# Patient Record
Sex: Male | Born: 1955
Health system: Southern US, Community
[De-identification: ages and names within clinical notes are randomized; demographics above are authoritative.]

## PROBLEM LIST (undated history)

## (undated) DIAGNOSIS — E785 Hyperlipidemia, unspecified: Secondary | ICD-10-CM

## (undated) DIAGNOSIS — I2510 Atherosclerotic heart disease of native coronary artery without angina pectoris: Secondary | ICD-10-CM

## (undated) DIAGNOSIS — M549 Dorsalgia, unspecified: Secondary | ICD-10-CM

## (undated) DIAGNOSIS — M25561 Pain in right knee: Secondary | ICD-10-CM

## (undated) DIAGNOSIS — F329 Major depressive disorder, single episode, unspecified: Secondary | ICD-10-CM

## (undated) DIAGNOSIS — D126 Benign neoplasm of colon, unspecified: Secondary | ICD-10-CM

## (undated) DIAGNOSIS — I219 Acute myocardial infarction, unspecified: Secondary | ICD-10-CM

## (undated) DIAGNOSIS — G8929 Other chronic pain: Secondary | ICD-10-CM

## (undated) DIAGNOSIS — J90 Pleural effusion, not elsewhere classified: Secondary | ICD-10-CM

## (undated) DIAGNOSIS — E669 Obesity, unspecified: Secondary | ICD-10-CM

## (undated) DIAGNOSIS — M25562 Pain in left knee: Secondary | ICD-10-CM

## (undated) DIAGNOSIS — E119 Type 2 diabetes mellitus without complications: Secondary | ICD-10-CM

## (undated) DIAGNOSIS — I10 Essential (primary) hypertension: Secondary | ICD-10-CM

## (undated) DIAGNOSIS — K219 Gastro-esophageal reflux disease without esophagitis: Secondary | ICD-10-CM

## (undated) DIAGNOSIS — F410 Panic disorder [episodic paroxysmal anxiety] without agoraphobia: Secondary | ICD-10-CM

## (undated) DIAGNOSIS — M1990 Unspecified osteoarthritis, unspecified site: Secondary | ICD-10-CM

## (undated) DIAGNOSIS — K589 Irritable bowel syndrome without diarrhea: Secondary | ICD-10-CM

## (undated) DIAGNOSIS — F32A Depression, unspecified: Secondary | ICD-10-CM

## (undated) HISTORY — DX: Irritable bowel syndrome, unspecified: K58.9

## (undated) HISTORY — DX: Atherosclerotic heart disease of native coronary artery without angina pectoris: I25.10

## (undated) HISTORY — DX: Morbid (severe) obesity due to excess calories: E66.01

## (undated) HISTORY — DX: Dorsalgia, unspecified: M54.9

## (undated) HISTORY — DX: Gastro-esophageal reflux disease without esophagitis: K21.9

## (undated) HISTORY — PX: SHOULDER ARTHROSCOPY W/ ROTATOR CUFF REPAIR: SHX2400

## (undated) HISTORY — DX: Hyperlipidemia, unspecified: E78.5

## (undated) HISTORY — PX: LUMBAR FUSION: SHX111

## (undated) HISTORY — DX: Other chronic pain: G89.29

## (undated) HISTORY — PX: UMBILICAL HERNIA REPAIR: SHX196

## (undated) HISTORY — DX: Type 2 diabetes mellitus without complications: E11.9

## (undated) HISTORY — DX: Acute myocardial infarction, unspecified: I21.9

## (undated) HISTORY — PX: BACK SURGERY: SHX140

## (undated) HISTORY — DX: Pleural effusion, not elsewhere classified: J90

## (undated) HISTORY — DX: Essential (primary) hypertension: I10

## (undated) HISTORY — PX: HERNIA REPAIR: SHX51

## (undated) HISTORY — DX: Benign neoplasm of colon, unspecified: D12.6

## (undated) HISTORY — DX: Obesity, unspecified: E66.9

## (undated) HISTORY — PX: CARPAL TUNNEL RELEASE: SHX101

---

## 2000-04-01 ENCOUNTER — Encounter (INDEPENDENT_AMBULATORY_CARE_PROVIDER_SITE_OTHER): Payer: Self-pay

## 2000-04-01 ENCOUNTER — Ambulatory Visit (HOSPITAL_COMMUNITY): Admission: RE | Admit: 2000-04-01 | Discharge: 2000-04-01 | Payer: Self-pay | Admitting: Internal Medicine

## 2000-04-01 DIAGNOSIS — D126 Benign neoplasm of colon, unspecified: Secondary | ICD-10-CM

## 2000-04-01 HISTORY — DX: Benign neoplasm of colon, unspecified: D12.6

## 2000-04-05 ENCOUNTER — Ambulatory Visit (HOSPITAL_COMMUNITY): Admission: RE | Admit: 2000-04-05 | Discharge: 2000-04-05 | Payer: Self-pay | Admitting: Internal Medicine

## 2000-04-05 ENCOUNTER — Encounter: Payer: Self-pay | Admitting: Internal Medicine

## 2000-04-30 ENCOUNTER — Ambulatory Visit (HOSPITAL_COMMUNITY): Admission: RE | Admit: 2000-04-30 | Discharge: 2000-04-30 | Payer: Self-pay | Admitting: Surgery

## 2002-04-16 ENCOUNTER — Emergency Department (HOSPITAL_COMMUNITY): Admission: EM | Admit: 2002-04-16 | Discharge: 2002-04-16 | Payer: Self-pay | Admitting: Emergency Medicine

## 2002-04-16 ENCOUNTER — Encounter: Payer: Self-pay | Admitting: Emergency Medicine

## 2004-06-18 ENCOUNTER — Ambulatory Visit: Payer: Self-pay | Admitting: Internal Medicine

## 2004-06-26 ENCOUNTER — Ambulatory Visit: Payer: Self-pay | Admitting: Internal Medicine

## 2004-07-31 ENCOUNTER — Ambulatory Visit: Payer: Self-pay | Admitting: Internal Medicine

## 2006-02-01 ENCOUNTER — Encounter: Admission: RE | Admit: 2006-02-01 | Discharge: 2006-02-01 | Payer: Self-pay | Admitting: Internal Medicine

## 2006-03-06 ENCOUNTER — Inpatient Hospital Stay (HOSPITAL_COMMUNITY): Admission: RE | Admit: 2006-03-06 | Discharge: 2006-03-09 | Payer: Self-pay | Admitting: Neurological Surgery

## 2006-05-14 ENCOUNTER — Encounter: Admission: RE | Admit: 2006-05-14 | Discharge: 2006-07-30 | Payer: Self-pay | Admitting: Neurological Surgery

## 2006-09-17 ENCOUNTER — Observation Stay (HOSPITAL_COMMUNITY): Admission: EM | Admit: 2006-09-17 | Discharge: 2006-09-18 | Payer: Self-pay | Admitting: *Deleted

## 2006-10-13 ENCOUNTER — Ambulatory Visit: Payer: Self-pay | Admitting: Cardiology

## 2007-02-14 ENCOUNTER — Emergency Department (HOSPITAL_COMMUNITY): Admission: EM | Admit: 2007-02-14 | Discharge: 2007-02-14 | Payer: Self-pay | Admitting: Emergency Medicine

## 2008-01-29 ENCOUNTER — Ambulatory Visit (HOSPITAL_COMMUNITY): Admission: RE | Admit: 2008-01-29 | Discharge: 2008-01-29 | Payer: Self-pay | Admitting: Neurological Surgery

## 2008-04-17 ENCOUNTER — Ambulatory Visit: Payer: Self-pay | Admitting: Occupational Medicine

## 2008-04-17 DIAGNOSIS — L259 Unspecified contact dermatitis, unspecified cause: Secondary | ICD-10-CM | POA: Insufficient documentation

## 2008-04-17 LAB — CONVERTED CEMR LAB
Bilirubin Urine: NEGATIVE
Blood in Urine, dipstick: NEGATIVE
Glucose, Urine, Semiquant: NEGATIVE
Nitrite: NEGATIVE
Protein, U semiquant: NEGATIVE
Specific Gravity, Urine: 1.025
pH: 5.5

## 2010-01-14 ENCOUNTER — Emergency Department (HOSPITAL_COMMUNITY): Admission: EM | Admit: 2010-01-14 | Discharge: 2010-01-14 | Payer: Self-pay | Admitting: Emergency Medicine

## 2010-01-16 ENCOUNTER — Ambulatory Visit (HOSPITAL_COMMUNITY)
Admission: RE | Admit: 2010-01-16 | Discharge: 2010-01-16 | Payer: Self-pay | Source: Home / Self Care | Admitting: Neurological Surgery

## 2010-01-17 ENCOUNTER — Encounter: Admission: RE | Admit: 2010-01-17 | Discharge: 2010-01-17 | Payer: Self-pay | Admitting: Neurosurgery

## 2010-01-28 ENCOUNTER — Emergency Department (HOSPITAL_BASED_OUTPATIENT_CLINIC_OR_DEPARTMENT_OTHER)
Admission: EM | Admit: 2010-01-28 | Discharge: 2010-01-28 | Payer: Self-pay | Source: Home / Self Care | Admitting: Emergency Medicine

## 2010-02-18 DIAGNOSIS — I219 Acute myocardial infarction, unspecified: Secondary | ICD-10-CM

## 2010-02-18 HISTORY — DX: Acute myocardial infarction, unspecified: I21.9

## 2010-02-20 ENCOUNTER — Inpatient Hospital Stay (HOSPITAL_COMMUNITY)
Admission: RE | Admit: 2010-02-20 | Discharge: 2010-02-23 | Payer: Self-pay | Source: Home / Self Care | Attending: Neurological Surgery | Admitting: Neurological Surgery

## 2010-02-20 LAB — GLUCOSE, POCT (MANUAL RESULT ENTRY)
Glucose, Bld: 118 mg/dL — ABNORMAL HIGH (ref 70–99)
Operator id: 133981

## 2010-02-21 LAB — GLUCOSE, CAPILLARY
Glucose-Capillary: 152 mg/dL — ABNORMAL HIGH (ref 70–99)
Glucose-Capillary: 174 mg/dL — ABNORMAL HIGH (ref 70–99)
Glucose-Capillary: 145 mg/dL — ABNORMAL HIGH (ref 70–99)
Glucose-Capillary: 210 mg/dL — ABNORMAL HIGH (ref 70–99)
Glucose-Capillary: 123 mg/dL — ABNORMAL HIGH (ref 70–99)
Glucose-Capillary: 83 mg/dL (ref 70–99)

## 2010-02-22 LAB — GLUCOSE, CAPILLARY
Glucose-Capillary: 90 mg/dL (ref 70–99)
Glucose-Capillary: 102 mg/dL — ABNORMAL HIGH (ref 70–99)
Glucose-Capillary: 115 mg/dL — ABNORMAL HIGH (ref 70–99)

## 2010-02-23 LAB — GLUCOSE, CAPILLARY
Glucose-Capillary: 64 mg/dL — ABNORMAL LOW (ref 70–99)
Glucose-Capillary: 95 mg/dL (ref 70–99)

## 2010-03-11 ENCOUNTER — Encounter: Payer: Self-pay | Admitting: Neurological Surgery

## 2010-04-12 NOTE — Discharge Summary (Signed)
NAMEAXEL, [name]                ACCOUNT NO.:  [id number]  MEDICAL RECORD NO.:  [id number]          PATIENT TYPE:  INP  LOCATION:  3015                         FACILITY:  [facility]  PHYSICIAN:  Stefani Dama, M.D.  DATE OF BIRTH:  [birth year]  DATE OF ADMISSION:  02/20/2010 DATE OF DISCHARGE:  02/23/2010                              DISCHARGE SUMMARY   ADMITTING DIAGNOSIS:  Lumbar spondylosis and stenosis, L4-5, L5-S1 with previous arthrodesis at L2-L4.  DISCHARGE DIAGNOSES: 1. Lumbar spondylosis and stenosis, L4-5, L5-S1 with previous     arthrodesis at L2-L4. 2. Acute blood loss anemia.  OPERATIONS AND PROCEDURES:  Decompression L4-5, L5-S1 posterior lumbar interbody arthrodesis, revision of segmental fixation L2-S1 and posterolateral arthrodesis L4-5, L5-S1.  BRIEF HISTORY AND HOSPITAL COURSE:  Spencer [name] is a 55 year old gentleman who had significant problems, chronic back pain, recently had a right lumbar radiculopathy was quite severe, developed significant stenosis at the L4-5 level and L5-S1 and below his previous L2-L4 posterior spinal fusion which was done in 2008, with this adjacent segment disease, he failed conservative care including epidural steroid injections at that time, home exercise program, physical therapy, anti- inflammatories, and medications, and he elected to proceed with revision arthrodesis and decompression.  He tolerated this procedure well on February 20, 2010.  Postoperatively, first day, he was doing well.  Vital signs were stable.  Wound was with moderate drainage.  Dressing was changed.  Neurovascularly intact.  No focal deficits.  Foley catheter discontinued.  He was weaned off his PCA, placed on p.o. Percocet, IV was hep-locked, transferred to 3000 of the neurosurgical ICU.  Discharge planning was arranged.  Started physical therapy, occupational therapy, started with mobilization.  He remained neurologically intact during  his hospitalization.  Discharge planning was started for home health physical therapy and home health occupational therapy.  He continued to mobilize and is ready for discharge home on February 23, 2010, eating well, voiding well, ambulating safely following his back precautions. His dressing drainage has subsided.  Vital signs were stable.  He is afebrile.  No new focal deficits.  DISCHARGE CONDITION:  Stable and improved.  DISCHARGE INSTRUCTIONS:  Discharged on February 23, 2010, follow up in 3-4 weeks with Dr. Danielle Dess, given prescription for Percocet, which is on the chart.  Continue with his back precautions, home health, OT and PT, and a 3-in-1 rolling walker with wheels for home use.  DISCHARGE MEDICATIONS: 1. Vitamin C 500 mg 2 tablets daily. 2. 81 mg enteric coated aspirin daily. 3. MiraLax p.r.n. 4. Dulcolax p.r.n. 5. Vitamin B6 100 mg daily/ 6. Tramadol one p.o. q.6 hours p.r.n. pain. 7. Lyrica 150 mg b.i.d. 8. Percocet 5/325 one p.o. q.4-6 hours p.r.n. pain. 9. Sertraline 50 mg p.o. daily. 10.Ranitidine 150 mg p.o. daily.11.Diltiazem CD XT 240 one p.o. daily. 12.Ambien 10 mg p.o. bedtime. 13.Alprazolam 0.5 mg one p.o. q.6 hours p.r.n. pain. 14.Lovaza 1 g p.o. 2 tablets twice daily. 15.Robaxin 500 mg t.i.d. p.r.n. 16.Hydrochlorothiazide 25 mg p.o. daily. 17.Quinapril 20 mg p.o. daily. 18.Glimepiride and metformin 5/500 two tablets twice a day.  DISCHARGE FOLLOWUP:  In our office in  3-4 weeks.  Continue with ambulation, deep breathing 10 breaths every hour while awake.  Contact our office prior to followup if he has any questions or concerns.  DISCHARGE CONDITION:  Stable, improved.     [name] Medico.   ______________________________ Stefani Dama, M.D.    SCI/MEDQ  D:  04/05/2010  T:  04/06/2010  Job:  [id number]  Electronically Signed by Orlin Hilding P.A. on 04/11/2010 02:50:39 PM Electronically Signed by Barnett Abu M.D. on 04/12/2010 08:02:18 AM

## 2010-04-16 ENCOUNTER — Ambulatory Visit: Payer: 59 | Attending: Neurological Surgery | Admitting: Physical Therapy

## 2010-04-16 DIAGNOSIS — IMO0001 Reserved for inherently not codable concepts without codable children: Secondary | ICD-10-CM | POA: Insufficient documentation

## 2010-04-16 DIAGNOSIS — M6281 Muscle weakness (generalized): Secondary | ICD-10-CM | POA: Insufficient documentation

## 2010-04-16 DIAGNOSIS — M545 Low back pain, unspecified: Secondary | ICD-10-CM | POA: Insufficient documentation

## 2010-04-16 DIAGNOSIS — R269 Unspecified abnormalities of gait and mobility: Secondary | ICD-10-CM | POA: Insufficient documentation

## 2010-04-17 ENCOUNTER — Ambulatory Visit: Payer: 59 | Admitting: Physical Therapy

## 2010-04-20 ENCOUNTER — Ambulatory Visit: Payer: 59 | Attending: Neurological Surgery | Admitting: Physical Therapy

## 2010-04-20 DIAGNOSIS — M545 Low back pain, unspecified: Secondary | ICD-10-CM | POA: Insufficient documentation

## 2010-04-20 DIAGNOSIS — IMO0001 Reserved for inherently not codable concepts without codable children: Secondary | ICD-10-CM | POA: Insufficient documentation

## 2010-04-20 DIAGNOSIS — M6281 Muscle weakness (generalized): Secondary | ICD-10-CM | POA: Insufficient documentation

## 2010-04-20 DIAGNOSIS — R269 Unspecified abnormalities of gait and mobility: Secondary | ICD-10-CM | POA: Insufficient documentation

## 2010-04-23 ENCOUNTER — Ambulatory Visit: Payer: 59 | Admitting: Physical Therapy

## 2010-04-24 ENCOUNTER — Ambulatory Visit: Payer: 59 | Admitting: Physical Therapy

## 2010-04-25 ENCOUNTER — Ambulatory Visit: Payer: 59 | Admitting: Physical Therapy

## 2010-04-30 ENCOUNTER — Ambulatory Visit: Payer: 59 | Admitting: Physical Therapy

## 2010-04-30 LAB — BASIC METABOLIC PANEL
Creatinine, Ser: 0.79 mg/dL (ref 0.4–1.5)
Potassium: 4 mEq/L (ref 3.5–5.1)
Sodium: 137 mEq/L (ref 135–145)

## 2010-04-30 LAB — CBC
MCH: 29.6 pg (ref 26.0–34.0)
MCHC: 33.7 g/dL (ref 30.0–36.0)
RDW: 13.5 % (ref 11.5–15.5)
WBC: 7.4 10*3/uL (ref 4.0–10.5)

## 2010-04-30 LAB — GLUCOSE, CAPILLARY
Glucose-Capillary: 114 mg/dL — ABNORMAL HIGH (ref 70–99)
Glucose-Capillary: 121 mg/dL — ABNORMAL HIGH (ref 70–99)
Glucose-Capillary: 124 mg/dL — ABNORMAL HIGH (ref 70–99)
Glucose-Capillary: 166 mg/dL — ABNORMAL HIGH (ref 70–99)

## 2010-04-30 LAB — MRSA PCR SCREENING: MRSA by PCR: NEGATIVE

## 2010-04-30 LAB — POCT I-STAT 4, (NA,K, GLUC, HGB,HCT)
Glucose, Bld: 116 mg/dL — ABNORMAL HIGH (ref 70–99)
HCT: 32 % — ABNORMAL LOW (ref 39.0–52.0)
Hemoglobin: 10.9 g/dL — ABNORMAL LOW (ref 13.0–17.0)
Potassium: 4.3 mEq/L (ref 3.5–5.1)
Sodium: 138 mEq/L (ref 135–145)

## 2010-04-30 LAB — TYPE AND SCREEN: ABO/RH(D): B POS

## 2010-04-30 LAB — SURGICAL PCR SCREEN: MRSA, PCR: NEGATIVE

## 2010-04-30 LAB — POCT I-STAT GLUCOSE
Glucose, Bld: 118 mg/dL — ABNORMAL HIGH (ref 70–99)
Operator id: 133981

## 2010-05-01 LAB — URINALYSIS, ROUTINE W REFLEX MICROSCOPIC: Specific Gravity, Urine: 1.021 (ref 1.005–1.030)

## 2010-05-02 ENCOUNTER — Ambulatory Visit: Payer: 59 | Admitting: Physical Therapy

## 2010-05-04 ENCOUNTER — Ambulatory Visit: Payer: 59 | Admitting: Physical Therapy

## 2010-05-07 ENCOUNTER — Ambulatory Visit: Payer: 59 | Admitting: Physical Therapy

## 2010-05-09 ENCOUNTER — Ambulatory Visit: Payer: 59 | Admitting: Physical Therapy

## 2010-05-11 ENCOUNTER — Encounter: Payer: Medicare Other | Admitting: Physical Therapy

## 2010-05-15 ENCOUNTER — Ambulatory Visit: Payer: 59 | Admitting: Physical Therapy

## 2010-05-17 ENCOUNTER — Ambulatory Visit: Payer: 59 | Admitting: Physical Therapy

## 2010-05-18 ENCOUNTER — Ambulatory Visit: Payer: 59 | Admitting: Physical Therapy

## 2010-05-21 ENCOUNTER — Ambulatory Visit: Payer: 59 | Attending: Neurological Surgery | Admitting: Physical Therapy

## 2010-05-21 DIAGNOSIS — M545 Low back pain, unspecified: Secondary | ICD-10-CM | POA: Insufficient documentation

## 2010-05-21 DIAGNOSIS — R269 Unspecified abnormalities of gait and mobility: Secondary | ICD-10-CM | POA: Insufficient documentation

## 2010-05-21 DIAGNOSIS — M6281 Muscle weakness (generalized): Secondary | ICD-10-CM | POA: Insufficient documentation

## 2010-05-21 DIAGNOSIS — IMO0001 Reserved for inherently not codable concepts without codable children: Secondary | ICD-10-CM | POA: Insufficient documentation

## 2010-05-23 ENCOUNTER — Ambulatory Visit: Payer: 59 | Admitting: Physical Therapy

## 2010-05-24 ENCOUNTER — Encounter: Payer: Commercial Managed Care - PPO | Admitting: Physical Therapy

## 2010-07-03 NOTE — Discharge Summary (Signed)
Spencer [name], Spencer [name]                ACCOUNT NO.:  [id number]   MEDICAL RECORD NO.:  [id number]          PATIENT TYPE:  INP   LOCATION:  2024                         FACILITY:  [facility]   PHYSICIAN:  Larina Earthly, M.D.        DATE OF BIRTH:  02/15/1956   DATE OF ADMISSION:  09/17/2006  DATE OF DISCHARGE:  09/18/2006                               DISCHARGE SUMMARY   DISCHARGE DIAGNOSES:  1. Atypical chest pain rule out myocardial infarction with three sets      cardiac enzymes unremarkable.  2. Anxiety disorder in the setting of multiple health related      stressors.  3. Hypertension.  4. Type 2 diabetes mellitus complicated by hypoglycemia.  5. Pain management status post spinal lumbar decompression.   SECONDARY DIAGNOSES:  1. Irritable bowel syndrome and constipation.  2. Seasonal allergic rhinitis.  3. Hyperlipidemia   DISCHARGE MEDICATIONS:  1. Hydrochlorothiazide 25 mg p.o. daily.  2. Glucovance 5/500 one p.o. b.i.d.  3. Cardizem extended release 240 mg each day.  4. Quinapril 20 mg each day.  5. Aspirin 81 mg each day.  6. Lopid as directed before.  7. Ranitidine 300 mg each day.  8. Aspirin 81 mg each day.  9. Allegra 180 mg each day.  10.MiraLax 17 grams each day.  Xanax 0.5 mg p.o. q.6 h. p.r.n.      prescription for #30 no refills given as a new prescription.   FOLLOWUP:  The patient call Dr. Lanell Matar office for routine follow up  on his atypical chest pain anxiety disorder; and the need for an  outpatient stress test at [facility] cardiology per the patient's request.  The patient will also need further review of his oral hypoglycemic  agents and possibly with the discontinuation and reduction in the dose  of his oral sulfanuria.   PERTINENT LABS:  Cardiac enzymes x3 negative.  Please see history and  physical for all admission labs.   HISTORY OF PRESENT ILLNESS:  This is a 55 year old Caucasian male who  has multiple cardiovascular risk factors including a family  history for  early heart disease who presented with vague left-sided chest  complaints, in the setting of possible hypoglycemia and significant  sweats and anxiety disorder.  Given his multiple risk factors; and his  presenting symptomatology as discussed in my dictated history of the  present illness.  The patient was admitted for further evaluation and  management to include ruling out for myocardial infarction.   HOSPITAL COURSE:  The patient remained hemodynamically stable and  started Darvocet for pain relief of his lower back discomfort as well as  Xanax used on one occasion prior to bedtime with complete resolution of  the patient's left-sided discomfort.  Given the fact that his cardiac  enzymes were unremarkable the next day; and the fact that he is  hemodynamically stable and ambulating in the hall without difficulties.  He was deemed appropriate for discharge and further management on an  outpatient basis specifically:  1. He may need an outpatient stress test given his multiple  cardiovascular risk factors.  2. We will need to restart all of his antihypertensive medications and      look for stability of his blood pressure on an outpatient basis.  3. For his type 2 diabetes mellitus, he has recurrent hypoglycemia and      he was advised to check his CBGs more frequently and his oral      sulfanuria composition of his Glucovance dosing may need to be      reduced in the future at Dr. Lanell Matar discretion.  4. Pain management.  The patient was encouraged to use Darvocet on as-      needed basis as long as he was not developing any dependence and or      adverse side effects consisting of constipation.  5. Anxiety disorder, apparently longstanding.  A short prescription      for Xanax was given to the patient with the possible use of long-      term medications to be used in the future at Dr. Lanell Matar      discretion, the latter was discussed with the patient and wife at       length.   The patient was discharged in good condition on September 18, 2006.  on any  towel      Larina Earthly, M.D.  Electronically Signed     RA/MEDQ  D:  09/18/2006  T:  09/18/2006  Job:  [id number]   cc:   Geoffry Paradise, M.D.  [facility] Cardiology

## 2010-07-03 NOTE — H&P (Signed)
Spencer [name], Spencer [name]                ACCOUNT NO.:  [id number]   MEDICAL RECORD NO.:  [id number]          PATIENT TYPE:  INP   LOCATION:  2024                         FACILITY:  [facility]   PHYSICIAN:  Larina Earthly, M.D.        DATE OF BIRTH:  [birth year]   DATE OF ADMISSION:  09/17/2006  DATE OF DISCHARGE:                              HISTORY & PHYSICAL   CHIEF COMPLAINT:  Hyper feeling inside the chest with sweats.   HISTORY OF THE PRESENT ILLNESS:  This is a 55 year old Caucasian male  with a 9-year history of type 2 diabetes mellitus, hyperlipidemia,  hypertension who has lost approximately 40 to 60 pounds in the last half  year status post decompression of a lumbar stenosis by Dr. Danielle Dess.  His  diabetes has been recently complicated by hypoglycemia with a subsequent  dose reduction of his metformin and sulfonylurea.  The patient's family  history is notable for significant early heart disease in the setting of  tobacco abuse and hypertension as well as type 2 diabetes mellitus.   Of note, the patient has been trying to wean himself off of his pain  medications status post his lumbar decompression and also trying to  continue to lose weight by decreasing his caloric intake and keeping  active by walking approximately 1 mile a day, if not more, as well as  participating in water aerobics at the [facility]   On the evening of September 16, 2006, the patient only took a Tylenol and  Benadryl prior to bedtime, and he subsequently awoke at approximately 2  to 3 p.m. feeling hyper in the left chest but denies any frank pain or  squeezing-type sensation.  He denies any sweats, nausea or vomiting, or  shortness of breath at the time.  He remained restless for the rest of  the evening, and he awoke at approximately 7 to 8 a.m. and took an  approximately 1-mile walk, which is his norm.  He continued to have this  discomfort in the left chest; however, this did not increase.  This has  been complicated by  stressors consisting of household issues and his  overall health.  He subsequently ate a bowel of cereal and showered  followed by significant sweats in the setting of continued left chest  discomfort but no frank pain.  He later presented to the emergency room  later in the day after discussion with his wife who is a case Production designer, theatre/television/film at  [employer] as well as other health care professionals.  In the  emergency room, his course was complicated by hypoglycemia to a sugar of  49, having taken his oral sulfonylurea and only 1 bowel of cereal during  the day.  After his sugar subsequently increased with caloric intake, he  felt significantly better but still had vague symptoms in the left side  of his chest.  Initial evaluation in the emergency room revealed normal  cardiac enzymes, EKG, chest x-ray, and CBC and renal and liver  parameters.  Given his continued vague symptoms in the left chest as  well  as his family history of significant early heart disease and his  cardiovascular risk factors consisting of diabetes, hypertension, and  hyperlipidemia, he was deemed appropriate for 23-hour observation to  rule out for myocardial infarction and further evaluation and management  of any potential cardiac ischemia.  Please note that his sweats may have  been complicated by hypoglycemia in the setting of decreased caloric  intake.   REVIEW OF SYSTEMS:  As above but negative for any neurological or  musculoskeletal deficits, and specifically negative for any history of  microvascular complications of his diabetes, including retinopathy,  neuropathy, or nephropathy.   PROBLEM LIST:  1. Type 2 diabetes x9 years.  2. Hyperlipidemia with initiation of fenofibrate.  3. Hypertension.  4. Irritable bowel syndrome.  5. Constipation.  6. Seasonal allergic rhinitis.  7. GERD.  8. A history of spinal decompression in January of 2008 by Dr. Danielle Dess.   MEDICATIONS:  Include:  1.  Hydrochlorothiazide 25 mg.  2. Accupril 20 mg.  3. Diltiazem XT 240 mg.  4. Ranitidine 300 mg each day.  5. Triamcinolone 0.1% p.r.n.  6. Aspirin 81 mg every other day.  7. Fexofenadine 180 mg each day.  8. Metrogel 1% each day.  9. Glucovance 5/500 one p.o. b.i.d., decreased within the last couple      of months secondary to hypoglycemia.  10.Vitamin B6.  11.Vitamin C.  12.MiraLax on a daily basis.  13.Possibly fenofibrate.   FAMILY HISTORY:  Father died at the age of 66 of lung cancer, coronary  artery disease, and tobacco abuse.  Mother is 30 and has a history of  type 2 diabetes mellitus, hypertension, and peripheral vascular disease.  A 11 year old brother who is alive and well, a 51 year old brother who  has type 2 diabetes and early heart disease, and a 57 year old sister  who is alive and well.   SOCIAL HISTORY:  The patient is married.  His wife is a Futures trader at  [employer].  He is a Hospital doctor for [employer] and denies  any tobacco or alcohol abuse.   LABORATORY EVALUATION:  Sodium 140, potassium 3.9, chloride 105, carbon  dioxide 26, glucose 120, BUN 18, creatinine 0.77.  Liver function test  normal.  Myoglobin 28.5, CK-MB less than 1, troponin-I less than 0.05.  White blood cell count 8.5, hemoglobin 13.2, hematocrit 39%, platelet  count 333.  EKG reveals normal sinus rhythm, and chest x-ray is  unremarkable with no cardiomegaly or acute findings.   PHYSICAL EXAM:  GENERAL:  An obese gentleman who is in no apparent  distress but somewhat anxious, alert and oriented x4.  VITAL SIGNS:  Blood pressure 119/73, pulse 74 to 80 and regular,  respirations 20 and nonlabored, oxygen saturation 99% on room air,  temperature 97 degrees Fahrenheit.  HEENT:  Sclerae anicteric.  Extraocular movements are intact.  Face is  symmetric.  NECK:  Supple.  There is no carotid bruits.  There is no thyromegaly.  No cervical lymphadenopathy.  LUNGS:  Clear to  auscultation bilaterally.  CARDIOVASCULAR EXAM:  Reveals a regular rate and rhythm without murmurs,  rubs, or gallops.  CHEST WALL:  There is no chest wall tenderness.  ABDOMINAL EXAM:  Reveals a soft, nontender, nondistended abdomen.  Bowel  sounds are present.  EXTREMITY EXAM:  Reveals no edema.  Pedal pulses are intact.  NEUROLOGICAL EXAM:  Grossly nonfocal.   ASSESSMENT AND PLAN:  1. Atypical chest pain with a family history of early heart  disease      and cardiovascular risk factors, including hypertension and      hyperlipidemia but with no known micro or macrovascular issues.  We      will rule out for myocardial infarction, provide aspirin, and his      normal angiotensin-converting enzyme inhibitor and calcium channel      blockade, and if patient's cardiac enzymes and electrocardiograms      remain unremarkable, we will plan for outpatient stress testing      and/or further cardiac management.  2. Hypertension, stable.  We will continue current medications.  3. Type 2 diabetes mellitus.  We will hold metformin and sulfonylurea      for the time being and use sliding scale insulin and reinitiate      possibly only metformin on an outpatient basis given his      hypoglycemia.  4. Pain management.  We will add Tylenol and Darvocet as needed.  5. Anxiety disorder.  We will provide Xanax p.r.n. and have Dr.      Jacky Kindle manage this disorder on an outpatient basis as needed.      Larina Earthly, M.D.  Electronically Signed     RA/MEDQ  D:  09/17/2006  T:  09/18/2006  Job:  [id number]   cc:   Geoffry Paradise, M.D.  [facility] Cardiology

## 2010-07-06 NOTE — Op Note (Signed)
[facility]  Patient:    Spencer [name], Spencer [name]                       MRN: [id number] Proc. Date: 04/30/00 Adm. Date:  [id number] Attending:  Bonnetta Barry CC:         Wilhemina Bonito. Eda Keys., M.D. [facility]   Operative Report  PREOPERATIVE DIAGNOSIS:  Incarcerated umbilical hernia.  POSTOPERATIVE DIAGNOSIS:  Incarcerated umbilical hernia.  OPERATION:  Repair of incarcerated umbilical hernia with Prolene mesh.  SURGEON:  Velora Heckler, M.D.  ANESTHESIA:  General.  ESTIMATED BLOOD LOSS:  Minimal.  PREPARATION:  Betadine.  COMPLICATIONS:  None.  INDICATIONS:  The patient is a 56 year old white male who was presents with incarcerated umbilical hernia.  He had been evaluated by Dr. Yancey [name] for abdominal pain.  He had previously been seen in our practice by Dr. Ovidio [name] in 1999.  At that time, he deferred surgery for incarcerated umbilical hernia.  He now comes to surgery for repair.  DESCRIPTION OF PROCEDURE:  The procedure was done in OR #2 at the [facility].  The patient was brought to the operating room and placed in the supine position on the operating room table.  Following administration of general anesthesia, the patient is prepped and draped in the usual strict aseptic fashion.  After ascertaining that adequate level of anesthesia had been obtained, an infraumbilical incision is made with a #15 blade. Dissection is carried down through the subcutaneous tissues.  Hemostasis was obtained with the electrocautery. Dissection is carried down to the hernia defect.  Fascial plane is developed circumferentially.  Hernia sac is opened, and it contains incarcerated omentum.  This is dissected away from the posterior aspect of the umbilicus with the electrocautery.  The omentum is reduced back within the peritoneal cavity.  The fascial plane is completely developed.  The hernia sac is cauterized with the electrocautery.  The  fascial defect is closed transversely with interrupted 0 Ethibond sutures.  Next, a sheet of Prolene mesh is cut to the appropriate dimensions and placed over the closure on the abdominal wall.  It is secured circumferentially with interrupted 0 Ethibond sutures.  The umbilicus is then affixed back to the abdominal wall with an interrupted 3-0 Vicryl suture.  Subcutaneous tissues are reapproximated with interrupted 3-0 Vicryl sutures.  Skin edges are reapproximated with interrupted 4-0 Vicryl subcuticular sutures.  Local anesthetic is injected.  Wound is washed and dried.  Benzoin and Steri-Strips are applied.  Sterile gauze dressings are applied.  The patient is awakened from anesthesia and brought to the recovery room in stable condition.  The patient tolerated the procedure well. DD:  04/30/00 TD:  04/30/00 Job: [id number]

## 2010-07-06 NOTE — Discharge Summary (Signed)
[name], Spencer [name]                ACCOUNT NO.:  [id number]   MEDICAL RECORD NO.:  [id number]          PATIENT TYPE:  INP   LOCATION:  3004                         FACILITY:  [facility]   PHYSICIAN:  Hilda Lias, M.D.   DATE OF BIRTH:  03-16-1955   DATE OF ADMISSION:  03/06/2006  DATE OF DISCHARGE:  03/09/2006                               DISCHARGE SUMMARY   ADMISSION DIAGNOSES:  Degenerative disk disease at the level of L3-L4.  Fusion from L2 to L4.   FINAL DIAGNOSES:  Degenerative disk disease at the level of L3-L4.  Fusion from L2 to L4.   CLINICAL HISTORY:  The patient was admitted because of back pain  radiating to both legs.  X-rays showed degenerative disk disease at the  level for L2-3, L3-4.  Surgery was advised.  Laboratory normal.   COURSE IN THE HOSPITAL:  The patient was taken to surgery and fusion of  L2-3 and L3-4 was performed using pedicle screws.  The patient today is  doing much better.  He is ambulating.  The wound is slightly reddish  without drainage, but no pain whatsoever.  He has been ambulating.  He  states that he wants to go home.  He is going to be discharged and he  will be seen by Dr. Danielle Dess in the office in a week.   CONDITION ON DISCHARGE:  Improved.   MEDICATIONS:  1. Dilaudid for pain.  2. Diazepam.  3. Cipro.   ACTIVITY:  Not to drive.  Not to do any lifting __________           ______________________________  Hilda Lias, M.D.     EB/MEDQ  D:  03/09/2006  T:  03/09/2006  Job:  [id number]

## 2010-07-06 NOTE — Op Note (Signed)
NAMESPENSER, HARREN                ACCOUNT NO.:  [id number]   MEDICAL RECORD NO.:  [id number]          PATIENT TYPE:  INP   LOCATION:  3172                         FACILITY:  [facility]   PHYSICIAN:  Stefani Dama, M.D.  DATE OF BIRTH:  02-28-1955   DATE OF PROCEDURE:  03/06/2006  DATE OF DISCHARGE:                               OPERATIVE REPORT   PREOPERATIVE DIAGNOSIS:  Spondylosis and stenosis L2-L3 and L3-L4,  spondylolisthesis L3-L4 with right sided radiculopathy L2, L3, and L4.   POSTOPERATIVE DIAGNOSIS:  Spondylosis and stenosis L2-L3 and L3-L4,  spondylolisthesis L3-L4 with right sided radiculopathy L2, L3, and L4.   OPERATION:  Lumbar laminectomy L2 and L3, decompression of L2, L3 and L4  nerve roots bilaterally, posterior lumbar interbody fusion L3-L4 with  PEEK spacers, transforaminal lumbar fusion L2-L3 on the left with PEEK  spacer, segmental fixation of L2 to L4 with pedicle screws,  posterolateral arthrodesis with local autograft and allograft, and  posterior interbody arthrodesis with local autograft and allograft L2 to  L4.   SURGEON:  Stefani Dama, M.D.   FIRST ASSISTANT:  Cristi Loron, M.D.   ANESTHESIA:  General endotracheal.   INDICATIONS:  Mamie Diiorio is a 55 year old individual who has had  significant back and right lower extremity pain.  He has had a previous  laminectomy L2-L3.  He was found to have a degenerative  spondylolisthesis at the L3-L4 level.  He is taken to the operating room  to undergo surgical decompression and arthrodesis from L2 to L4.   PROCEDURE:  The patient was brought to the operating room supine on the  stretcher. After smooth induction of general endotracheal anesthesia, he  was placed on the table in the prone position.  The back was prepped  with alcohol and DuraPrep and draped in a sterile fashion.  An  elliptical incision was made around his previous scar and this was  excised.  The incision was lengthened to expose  from L2 down to the L4,  the spinous processes and laminar arches. A localizing radiograph  identified the spinous process of L2.   Dissection was then carried out over the facet joints.  The facet joints  at the L2-L3 level were noted be severely hypertrophied and markedly  overgrown. At L3, there was noted be some looseness of the spinous  process and laminar arch complex.  Dissection was also carried over the  facet joints here and the facet joints, again, were also noted be  severely hypertrophied. Further dissection then allowed for performance  of the laminectomy L3.  This entire spinous process and laminar arch was  removed.  A high speed drill was used to bur down between the laminar  arch and then it was noted that there was, indeed, a spondylolisthesis  with the arch of L3 being disarticulated at the pars interarticularis.  With the lamina split, each of the facets up to the pars was able to be  removed in en bloc fashion.  The common dural tube was then identified.  This was carefully decompressed to allow decompression of the  L4 nerve  root.  The disc space at L3-L4 was explored and this was noted be  significantly bulging posteriorly and this area was isolated  bilaterally. L2 then underwent a laminectomy by removal of the entire  inferior facet of L2 to expose the superior facet of L3. On the left  side, the dissection was carried out to expose the L2 nerve root  superiorly.  There was a substantial amount of scar tissue as this had  been the site of a previous laminotomy.  The scar was released from the  common dural tube and also from the L3 nerve root as it entered the  foramen. This allowed mobilization of the common dural tube medially.  Superiorly, the L2 nerve root appeared trapped in the foramen with the  supra-articular process of L3 being involved and causing the stenosis  for the L2 nerve root and the exiting foramen.  A large facetectomy was  then performed  removing its superior articular process to expose the L2  nerve root and decompress it fully. The disc space in this area was also  noted to be bulging posteriorly.  This appears to have been the site of  previous surgery.  Once this was isolated, the disc space was entered,  and then a discectomy was undertaken as the next part of the procedure  to decompress the disc spaces at L2-L3 and at L3-L4. At L2-L3, the  discectomy was performed on the right side, at L3-L4, bilateral  discectomy was performed in total with gross total removal of the disc  to allow for placement of posterior interbody graft.   At L2-L3, the disc preparation curets were used to decorticate the  endplates and a sizer was used and it was felt that a 10-mm spacer would  fit nicely on the right side at the L2-L3 level. At L3-L4, the  interspace was decorticated after performing a gross total discectomy  and a 12-mm spacer was placed in this side.  The bone that was harvested  from performing the laminectomy was prepared by stripping any fascial  attachments and cutting the bone into small pieces, both cortical and  cancellus. This mixture was mixed with 5 mL of Osteocel and then placed  into the interspace, first at L2-L3 and then at L3-L4.  PEEK spacers  were then applied, the transforaminal route at L2-L3 on the right side  and bilaterally via the posterior interbody technique L3-L4.   The procedure was then continued by performing segmental fixation at L2,  L3 and L4 using fluoroscopic guidance to place pedicle screws.  These  were 6.5 x 50 mm screws in L2, 6.5 x 55 mm screws in L3, and 6.5 x 50 mm  screws in L4.  Each pedicle site was first probes using fluoroscopic  guidance and tapped to 6.5 mm size and then cutout was checked at each  probe holes and none was identified.  Final localizing radiograph  identified that the right L4 screw appeared to be placed laterally, however, on checking it was noted to be  within bone.  It was, however,  redirected medially to provide a more symmetric appearance to the  construct. With this then, the pedicle screws were placed but not before  the transverse processes were decorticated from L2 down to L4.  The  remainder of the bone graft that was present was packed into the lateral  gutters along with two pieces of Helios that was soaked in bone marrow  aspirate obtained  from the pedicle probes via a Jamshidi needle, this  was all from the probes on the left side at L2, L3 and L4. These Helios  strips that had been soaked in the bone marrow aspirate were then packed  into the lateral gutters and posteriorly from L2 to L4. Pedicle screws  were then placed. Pre-contoured 70-mm rods were then fitted between the  pedicle screws and these were tightened down in the neutral position and  torqued appropriately. Final radiographs were obtained with the  fluoroscopic unit before placement of the rods.   With the rods being secured, the area was inspected carefully for  hemostasis. The wound was irrigated copiously with antibiotic irrigating  solution.  Once hemostasis was assured, the lumbodorsal fascia was  closed with #1 Vicryl interrupted fashion, 2-0 Vicryl used in the  subcutaneous tissues, and 3-0 Vicryl subcuticularly.  Blood loss for the  entire procedure was estimated at 500 mL. The patient was returned to  the recovery room in stable condition.      Stefani Dama, M.D.  Electronically Signed     HJE/MEDQ  D:  03/06/2006  T:  03/06/2006  Job:  [id number]

## 2010-11-19 DIAGNOSIS — J90 Pleural effusion, not elsewhere classified: Secondary | ICD-10-CM

## 2010-11-19 HISTORY — PX: CARDIAC CATHETERIZATION: SHX172

## 2010-11-19 HISTORY — DX: Pleural effusion, not elsewhere classified: J90

## 2010-11-22 ENCOUNTER — Inpatient Hospital Stay (HOSPITAL_COMMUNITY)
Admission: EM | Admit: 2010-11-22 | Discharge: 2010-12-01 | DRG: 234 | Disposition: A | Discharge status: Home / Self Care | Payer: 59 | Attending: Cardiothoracic Surgery | Admitting: Cardiothoracic Surgery

## 2010-11-22 ENCOUNTER — Emergency Department (HOSPITAL_COMMUNITY): Payer: 59

## 2010-11-22 DIAGNOSIS — F411 Generalized anxiety disorder: Secondary | ICD-10-CM | POA: Diagnosis present

## 2010-11-22 DIAGNOSIS — I10 Essential (primary) hypertension: Secondary | ICD-10-CM | POA: Diagnosis present

## 2010-11-22 DIAGNOSIS — I214 Non-ST elevation (NSTEMI) myocardial infarction: Secondary | ICD-10-CM

## 2010-11-22 DIAGNOSIS — E785 Hyperlipidemia, unspecified: Secondary | ICD-10-CM | POA: Diagnosis present

## 2010-11-22 DIAGNOSIS — I6529 Occlusion and stenosis of unspecified carotid artery: Secondary | ICD-10-CM | POA: Diagnosis present

## 2010-11-22 DIAGNOSIS — I519 Heart disease, unspecified: Secondary | ICD-10-CM | POA: Diagnosis present

## 2010-11-22 DIAGNOSIS — Z7982 Long term (current) use of aspirin: Secondary | ICD-10-CM

## 2010-11-22 DIAGNOSIS — M549 Dorsalgia, unspecified: Secondary | ICD-10-CM | POA: Diagnosis present

## 2010-11-22 DIAGNOSIS — K219 Gastro-esophageal reflux disease without esophagitis: Secondary | ICD-10-CM | POA: Diagnosis present

## 2010-11-22 DIAGNOSIS — Z8249 Family history of ischemic heart disease and other diseases of the circulatory system: Secondary | ICD-10-CM

## 2010-11-22 DIAGNOSIS — I2589 Other forms of chronic ischemic heart disease: Secondary | ICD-10-CM | POA: Diagnosis present

## 2010-11-22 DIAGNOSIS — E119 Type 2 diabetes mellitus without complications: Secondary | ICD-10-CM | POA: Diagnosis present

## 2010-11-22 DIAGNOSIS — I2510 Atherosclerotic heart disease of native coronary artery without angina pectoris: Secondary | ICD-10-CM | POA: Diagnosis present

## 2010-11-22 DIAGNOSIS — I658 Occlusion and stenosis of other precerebral arteries: Secondary | ICD-10-CM | POA: Diagnosis present

## 2010-11-22 DIAGNOSIS — K589 Irritable bowel syndrome without diarrhea: Secondary | ICD-10-CM | POA: Diagnosis present

## 2010-11-22 LAB — CBC
HCT: 39.5 % (ref 39.0–52.0)
Hemoglobin: 13.7 g/dL (ref 13.0–17.0)
MCH: 28.9 pg (ref 26.0–34.0)
MCHC: 34.7 g/dL (ref 30.0–36.0)
RBC: 4.74 MIL/uL (ref 4.22–5.81)
RDW: 13.1 % (ref 11.5–15.5)

## 2010-11-22 LAB — POCT I-STAT TROPONIN I: Troponin i, poc: 2.7 ng/mL (ref 0.00–0.08)

## 2010-11-22 LAB — CK TOTAL AND CKMB (NOT AT ARMC)
CK, MB: 18.6 ng/mL (ref 0.3–4.0)
Relative Index: 5.1 — ABNORMAL HIGH (ref 0.0–2.5)
Total CK: 363 U/L — ABNORMAL HIGH (ref 7–232)

## 2010-11-22 LAB — APTT: aPTT: 37 seconds (ref 24–37)

## 2010-11-22 LAB — DIFFERENTIAL
Basophils Relative: 0 % (ref 0–1)
Eosinophils Relative: 3 % (ref 0–5)
Lymphocytes Relative: 24 % (ref 12–46)
Neutrophils Relative %: 65 % (ref 43–77)

## 2010-11-22 LAB — BASIC METABOLIC PANEL
CO2: 26 mEq/L (ref 19–32)
Chloride: 99 mEq/L (ref 96–112)
Creatinine, Ser: 0.81 mg/dL (ref 0.50–1.35)
GFR calc Af Amer: >90 mL/min (ref >90)
Potassium: 4 mEq/L (ref 3.5–5.1)
Sodium: 135 mEq/L (ref 135–145)

## 2010-11-22 LAB — GLUCOSE, CAPILLARY: Glucose-Capillary: 92 mg/dL (ref 70–99)

## 2010-11-22 LAB — HEMOGLOBIN A1C
Hgb A1c MFr Bld: 7.2 % — ABNORMAL HIGH (ref <5.7)
Mean Plasma Glucose: 160 mg/dL — ABNORMAL HIGH (ref <117)

## 2010-11-22 LAB — CARDIAC PANEL(CRET KIN+CKTOT+MB+TROPI)
Total CK: 403 U/L — ABNORMAL HIGH (ref 7–232)
Troponin I: 3.58 ng/mL (ref <0.30)

## 2010-11-22 LAB — HEPARIN LEVEL (UNFRACTIONATED): Heparin Unfractionated: <0.1 IU/mL — ABNORMAL LOW (ref 0.30–0.70)

## 2010-11-22 LAB — TROPONIN I: Troponin I: 1.01 ng/mL (ref <0.30)

## 2010-11-23 ENCOUNTER — Inpatient Hospital Stay (HOSPITAL_COMMUNITY): Payer: 59

## 2010-11-23 DIAGNOSIS — I2510 Atherosclerotic heart disease of native coronary artery without angina pectoris: Secondary | ICD-10-CM

## 2010-11-23 LAB — CARDIAC PANEL(CRET KIN+CKTOT+MB+TROPI)
Relative Index: 6.2 — ABNORMAL HIGH (ref 0.0–2.5)
Total CK: 312 U/L — ABNORMAL HIGH (ref 7–232)
Troponin I: 2.96 ng/mL (ref <0.30)

## 2010-11-23 LAB — CBC
HCT: 36.9 % — ABNORMAL LOW (ref 39.0–52.0)
Hemoglobin: 12.9 g/dL — ABNORMAL LOW (ref 13.0–17.0)
MCH: 29.4 pg (ref 26.0–34.0)
MCV: 84.7
Platelets: 304
RBC: 4.39 MIL/uL (ref 4.22–5.81)
WBC: 7.8

## 2010-11-23 LAB — COMPREHENSIVE METABOLIC PANEL
AST: 26
Albumin: 3.8
CO2: 28 mEq/L (ref 19–32)
Calcium: 9.2 mg/dL (ref 8.4–10.5)
Calcium: 9.2
Chloride: 102
Creatinine, Ser: 0.75 mg/dL (ref 0.50–1.35)
Creatinine, Ser: 0.83
GFR calc Af Amer: >90 mL/min (ref >90)
GFR calc Af Amer: >60
GFR calc non Af Amer: >90 mL/min (ref >90)
Glucose, Bld: 128 mg/dL — ABNORMAL HIGH (ref 70–99)
Total Bilirubin: 0.9
Total Protein: 8.1

## 2010-11-23 LAB — DIFFERENTIAL
Eosinophils Relative: 3
Lymphocytes Relative: 25
Lymphs Abs: 1.9
Monocytes Absolute: 0.6
Monocytes Relative: 8

## 2010-11-23 LAB — GLUCOSE, CAPILLARY
Glucose-Capillary: 130 mg/dL — ABNORMAL HIGH (ref 70–99)
Glucose-Capillary: 138 mg/dL — ABNORMAL HIGH (ref 70–99)
Glucose-Capillary: 204 mg/dL — ABNORMAL HIGH (ref 70–99)
Glucose-Capillary: 171 mg/dL — ABNORMAL HIGH (ref 70–99)
Glucose-Capillary: 160 mg/dL — ABNORMAL HIGH (ref 70–99)
Glucose-Capillary: 154 mg/dL — ABNORMAL HIGH (ref 70–99)
Glucose-Capillary: 225 mg/dL — ABNORMAL HIGH (ref 70–99)

## 2010-11-23 LAB — URINALYSIS, ROUTINE W REFLEX MICROSCOPIC
Hgb urine dipstick: NEGATIVE
Protein, ur: NEGATIVE
Urobilinogen, UA: 0.2

## 2010-11-23 LAB — LIPID PANEL
Cholesterol: 201 mg/dL — ABNORMAL HIGH (ref 0–200)
HDL: 27 mg/dL — ABNORMAL LOW (ref >39)
Triglycerides: 328 mg/dL — ABNORMAL HIGH (ref <150)

## 2010-11-24 ENCOUNTER — Inpatient Hospital Stay (HOSPITAL_COMMUNITY): Payer: 59

## 2010-11-24 DIAGNOSIS — R0989 Other specified symptoms and signs involving the circulatory and respiratory systems: Secondary | ICD-10-CM

## 2010-11-24 DIAGNOSIS — I2510 Atherosclerotic heart disease of native coronary artery without angina pectoris: Secondary | ICD-10-CM

## 2010-11-24 LAB — GLUCOSE, CAPILLARY
Glucose-Capillary: 107 mg/dL — ABNORMAL HIGH (ref 70–99)
Glucose-Capillary: 155 mg/dL — ABNORMAL HIGH (ref 70–99)
Glucose-Capillary: 84 mg/dL (ref 70–99)

## 2010-11-24 LAB — CBC
Platelets: 185 10*3/uL (ref 150–400)
RBC: 4.1 MIL/uL — ABNORMAL LOW (ref 4.22–5.81)
WBC: 7 10*3/uL (ref 4.0–10.5)

## 2010-11-24 LAB — BASIC METABOLIC PANEL
Chloride: 105 mEq/L (ref 96–112)
Creatinine, Ser: 0.81 mg/dL (ref 0.50–1.35)
GFR calc Af Amer: >90 mL/min (ref >90)
GFR calc non Af Amer: >90 mL/min (ref >90)
Potassium: 3.6 mEq/L (ref 3.5–5.1)

## 2010-11-24 LAB — HEPARIN LEVEL (UNFRACTIONATED): Heparin Unfractionated: 0.23 IU/mL — ABNORMAL LOW (ref 0.30–0.70)

## 2010-11-25 LAB — BLOOD GAS, ARTERIAL
Acid-Base Excess: 0.4 mmol/L (ref 0.0–2.0)
Bicarbonate: 24.3 mEq/L — ABNORMAL HIGH (ref 20.0–24.0)
Drawn by: 33100
FIO2: 0.21 %
O2 Saturation: 96.8 %
Patient temperature: 98.6
TCO2: 25.4 mmol/L (ref 0–100)
pCO2 arterial: 37.7 mmHg (ref 35.0–45.0)
pH, Arterial: 7.425 (ref 7.350–7.450)
pO2, Arterial: 86.8 mmHg (ref 80.0–100.0)

## 2010-11-25 LAB — CBC
HCT: 36.5 % — ABNORMAL LOW (ref 39.0–52.0)
RDW: 13.3 % (ref 11.5–15.5)
WBC: 6.6 10*3/uL (ref 4.0–10.5)

## 2010-11-25 LAB — URINALYSIS, ROUTINE W REFLEX MICROSCOPIC
Bilirubin Urine: NEGATIVE
Glucose, UA: NEGATIVE mg/dL
Hgb urine dipstick: NEGATIVE
Ketones, ur: NEGATIVE mg/dL
Leukocytes, UA: NEGATIVE
Nitrite: NEGATIVE
Protein, ur: NEGATIVE mg/dL
Specific Gravity, Urine: 1.01 (ref 1.005–1.030)
Urobilinogen, UA: 0.2 mg/dL (ref 0.0–1.0)
pH: 6 (ref 5.0–8.0)

## 2010-11-25 LAB — BASIC METABOLIC PANEL
BUN: 14 mg/dL (ref 6–23)
CO2: 22 mEq/L (ref 19–32)
Calcium: 9.2 mg/dL (ref 8.4–10.5)
Creatinine, Ser: 0.75 mg/dL (ref 0.50–1.35)
GFR calc non Af Amer: >90 mL/min (ref >90)
Glucose, Bld: 134 mg/dL — ABNORMAL HIGH (ref 70–99)
Sodium: 141 mEq/L (ref 135–145)

## 2010-11-25 LAB — HEMOGLOBIN A1C
Hgb A1c MFr Bld: 7.1 % — ABNORMAL HIGH (ref <5.7)
Mean Plasma Glucose: 157 mg/dL — ABNORMAL HIGH (ref <117)

## 2010-11-25 LAB — HEPARIN LEVEL (UNFRACTIONATED): Heparin Unfractionated: 0.16 IU/mL — ABNORMAL LOW (ref 0.30–0.70)

## 2010-11-26 ENCOUNTER — Inpatient Hospital Stay (HOSPITAL_COMMUNITY): Payer: 59

## 2010-11-26 DIAGNOSIS — I2510 Atherosclerotic heart disease of native coronary artery without angina pectoris: Secondary | ICD-10-CM

## 2010-11-26 HISTORY — PX: CORONARY ARTERY BYPASS GRAFT: SHX141

## 2010-11-26 LAB — POCT I-STAT GLUCOSE: Operator id: 3342

## 2010-11-26 LAB — PLATELET COUNT: Platelets: 201 10*3/uL (ref 150–400)

## 2010-11-26 LAB — POCT I-STAT 4, (NA,K, GLUC, HGB,HCT)
Glucose, Bld: 153 mg/dL — ABNORMAL HIGH (ref 70–99)
Glucose, Bld: 165 mg/dL — ABNORMAL HIGH (ref 70–99)
Glucose, Bld: 191 mg/dL — ABNORMAL HIGH (ref 70–99)
Glucose, Bld: 194 mg/dL — ABNORMAL HIGH (ref 70–99)
Glucose, Bld: 141 mg/dL — ABNORMAL HIGH (ref 70–99)
HCT: 31 % — ABNORMAL LOW (ref 39.0–52.0)
HCT: 29 % — ABNORMAL LOW (ref 39.0–52.0)
HCT: 27 % — ABNORMAL LOW (ref 39.0–52.0)
Hemoglobin: 11.6 g/dL — ABNORMAL LOW (ref 13.0–17.0)
Hemoglobin: 8.8 g/dL — ABNORMAL LOW (ref 13.0–17.0)
Hemoglobin: 9.9 g/dL — ABNORMAL LOW (ref 13.0–17.0)
Hemoglobin: 9.2 g/dL — ABNORMAL LOW (ref 13.0–17.0)
Potassium: 4 mEq/L (ref 3.5–5.1)
Potassium: 3.9 mEq/L (ref 3.5–5.1)
Potassium: 4.7 mEq/L (ref 3.5–5.1)
Potassium: 3.8 mEq/L (ref 3.5–5.1)
Sodium: 141 mEq/L (ref 135–145)
Sodium: 135 mEq/L (ref 135–145)
Sodium: 136 mEq/L (ref 135–145)
Sodium: 141 mEq/L (ref 135–145)

## 2010-11-26 LAB — POCT I-STAT, CHEM 8
BUN: 18 mg/dL (ref 6–23)
Calcium, Ion: 1.15 mmol/L (ref 1.12–1.32)
Chloride: 109 mEq/L (ref 96–112)
HCT: 29 % — ABNORMAL LOW (ref 39.0–52.0)
Potassium: 4.1 mEq/L (ref 3.5–5.1)
Sodium: 144 mEq/L (ref 135–145)

## 2010-11-26 LAB — POCT I-STAT 3, ART BLOOD GAS (G3+)
Acid-base deficit: 2 mmol/L (ref 0.0–2.0)
Acid-base deficit: 4 mmol/L — ABNORMAL HIGH (ref 0.0–2.0)
Acid-base deficit: 3 mmol/L — ABNORMAL HIGH (ref 0.0–2.0)
Bicarbonate: 24.1 mEq/L — ABNORMAL HIGH (ref 20.0–24.0)
Bicarbonate: 22.2 mEq/L (ref 20.0–24.0)
Bicarbonate: 22.7 mEq/L (ref 20.0–24.0)
Bicarbonate: 22.8 mEq/L (ref 20.0–24.0)
O2 Saturation: 99 %
O2 Saturation: 95 %
TCO2: 26 mmol/L (ref 0–100)
TCO2: 24 mmol/L (ref 0–100)
TCO2: 24 mmol/L (ref 0–100)
pCO2 arterial: 46.5 mmHg — ABNORMAL HIGH (ref 35.0–45.0)
pCO2 arterial: 41.3 mmHg (ref 35.0–45.0)
pCO2 arterial: 44.9 mmHg (ref 35.0–45.0)
pCO2 arterial: 46.6 mmHg — ABNORMAL HIGH (ref 35.0–45.0)
pH, Arterial: 7.288 — ABNORMAL LOW (ref 7.350–7.450)
pH, Arterial: 7.354 (ref 7.350–7.450)
pH, Arterial: 7.312 — ABNORMAL LOW (ref 7.350–7.450)
pH, Arterial: 7.3 — ABNORMAL LOW (ref 7.350–7.450)
pO2, Arterial: 66 mmHg — ABNORMAL LOW (ref 80.0–100.0)
pO2, Arterial: 126 mmHg — ABNORMAL HIGH (ref 80.0–100.0)
pO2, Arterial: 96 mmHg (ref 80.0–100.0)

## 2010-11-26 LAB — PROTIME-INR
INR: 1.42 (ref 0.00–1.49)
Prothrombin Time: 17.6 seconds — ABNORMAL HIGH (ref 11.6–15.2)

## 2010-11-26 LAB — CBC
HCT: 30.6 % — ABNORMAL LOW (ref 39.0–52.0)
HCT: 29.5 % — ABNORMAL LOW (ref 39.0–52.0)
Hemoglobin: 12.2 g/dL — ABNORMAL LOW (ref 13.0–17.0)
Hemoglobin: 10.4 g/dL — ABNORMAL LOW (ref 13.0–17.0)
Hemoglobin: 9.9 g/dL — ABNORMAL LOW (ref 13.0–17.0)
MCH: 28.6 pg (ref 26.0–34.0)
MCH: 28.4 pg (ref 26.0–34.0)
MCHC: 34 g/dL (ref 30.0–36.0)
MCHC: 33.6 g/dL (ref 30.0–36.0)
MCV: 84.1 fL (ref 78.0–100.0)
MCV: 84.5 fL (ref 78.0–100.0)
Platelets: 191 10*3/uL (ref 150–400)
Platelets: 170 10*3/uL (ref 150–400)
Platelets: 212 10*3/uL (ref 150–400)
RBC: 4.22 MIL/uL (ref 4.22–5.81)
RBC: 3.64 MIL/uL — ABNORMAL LOW (ref 4.22–5.81)
RBC: 3.49 MIL/uL — ABNORMAL LOW (ref 4.22–5.81)
RDW: 13.4 % (ref 11.5–15.5)
RDW: 13.7 % (ref 11.5–15.5)
WBC: 7.1 10*3/uL (ref 4.0–10.5)
WBC: 10.9 10*3/uL — ABNORMAL HIGH (ref 4.0–10.5)
WBC: 13.8 10*3/uL — ABNORMAL HIGH (ref 4.0–10.5)

## 2010-11-26 LAB — BASIC METABOLIC PANEL
CO2: 23 mEq/L (ref 19–32)
Chloride: 107 mEq/L (ref 96–112)
Creatinine, Ser: 0.8 mg/dL (ref 0.50–1.35)
GFR calc Af Amer: >90 mL/min (ref >90)
Potassium: 3.7 mEq/L (ref 3.5–5.1)
Sodium: 142 mEq/L (ref 135–145)

## 2010-11-26 LAB — GLUCOSE, CAPILLARY
Glucose-Capillary: 110 mg/dL — ABNORMAL HIGH (ref 70–99)
Glucose-Capillary: 120 mg/dL — ABNORMAL HIGH (ref 70–99)
Glucose-Capillary: 131 mg/dL — ABNORMAL HIGH (ref 70–99)

## 2010-11-26 LAB — SURGICAL PCR SCREEN
MRSA, PCR: NEGATIVE
Staphylococcus aureus: NEGATIVE

## 2010-11-26 LAB — CREATININE, SERUM
Creatinine, Ser: 0.93 mg/dL (ref 0.50–1.35)
GFR calc Af Amer: >90 mL/min (ref >90)
GFR calc non Af Amer: >90 mL/min (ref >90)

## 2010-11-26 LAB — HEMOGLOBIN AND HEMATOCRIT, BLOOD: HCT: 28.4 % — ABNORMAL LOW (ref 39.0–52.0)

## 2010-11-26 LAB — MAGNESIUM: Magnesium: 2.9 mg/dL — ABNORMAL HIGH (ref 1.5–2.5)

## 2010-11-26 LAB — APTT: aPTT: 32 seconds (ref 24–37)

## 2010-11-26 LAB — HEPARIN LEVEL (UNFRACTIONATED): Heparin Unfractionated: 0.4 IU/mL (ref 0.30–0.70)

## 2010-11-27 ENCOUNTER — Inpatient Hospital Stay (HOSPITAL_COMMUNITY): Payer: 59

## 2010-11-27 DIAGNOSIS — E1165 Type 2 diabetes mellitus with hyperglycemia: Secondary | ICD-10-CM

## 2010-11-27 DIAGNOSIS — IMO0001 Reserved for inherently not codable concepts without codable children: Secondary | ICD-10-CM

## 2010-11-27 LAB — CBC
HCT: 28.3 % — ABNORMAL LOW (ref 39.0–52.0)
HCT: 27.8 % — ABNORMAL LOW (ref 39.0–52.0)
Hemoglobin: 9.4 g/dL — ABNORMAL LOW (ref 13.0–17.0)
Hemoglobin: 9.3 g/dL — ABNORMAL LOW (ref 13.0–17.0)
MCH: 28.5 pg (ref 26.0–34.0)
MCHC: 33.5 g/dL (ref 30.0–36.0)
MCV: 85.3 fL (ref 78.0–100.0)
Platelets: 190 10*3/uL (ref 150–400)
RBC: 3.26 MIL/uL — ABNORMAL LOW (ref 4.22–5.81)
RDW: 14 % (ref 11.5–15.5)
WBC: 13.8 10*3/uL — ABNORMAL HIGH (ref 4.0–10.5)
WBC: 14.5 10*3/uL — ABNORMAL HIGH (ref 4.0–10.5)

## 2010-11-27 LAB — CREATININE, SERUM
Creatinine, Ser: 0.73 mg/dL (ref 0.50–1.35)
GFR calc Af Amer: >90 mL/min (ref >90)
GFR calc non Af Amer: >90 mL/min (ref >90)

## 2010-11-27 LAB — GLUCOSE, CAPILLARY
Glucose-Capillary: 109 mg/dL — ABNORMAL HIGH (ref 70–99)
Glucose-Capillary: 90 mg/dL (ref 70–99)
Glucose-Capillary: 130 mg/dL — ABNORMAL HIGH (ref 70–99)
Glucose-Capillary: 184 mg/dL — ABNORMAL HIGH (ref 70–99)
Glucose-Capillary: 174 mg/dL — ABNORMAL HIGH (ref 70–99)

## 2010-11-27 LAB — PREPARE PLATELET PHERESIS: Unit division: 0

## 2010-11-27 LAB — BASIC METABOLIC PANEL
CO2: 20 mEq/L (ref 19–32)
Chloride: 105 mEq/L (ref 96–112)
Sodium: 138 mEq/L (ref 135–145)

## 2010-11-27 LAB — POCT I-STAT 3, ART BLOOD GAS (G3+)
Acid-base deficit: 4 mmol/L — ABNORMAL HIGH (ref 0.0–2.0)
Bicarbonate: 21.3 mEq/L (ref 20.0–24.0)
TCO2: 22 mmol/L (ref 0–100)
pH, Arterial: 7.335 — ABNORMAL LOW (ref 7.350–7.450)
pO2, Arterial: 106 mmHg — ABNORMAL HIGH (ref 80.0–100.0)

## 2010-11-27 LAB — MAGNESIUM
Magnesium: 2.4 mg/dL (ref 1.5–2.5)
Magnesium: 2.1 mg/dL (ref 1.5–2.5)

## 2010-11-27 NOTE — Cardiovascular Report (Signed)
  NAMEJAYDRIAN, Spencer [name] NO.:  [id number]  MEDICAL RECORD NO.:  [id number]  LOCATION:  2916                         FACILITY:  [facility]  PHYSICIAN:  Peter M. Swaziland, M.D.  DATE OF BIRTH:  08-11-1955  DATE OF PROCEDURE:  11/23/2010 DATE OF DISCHARGE:                           CARDIAC CATHETERIZATION   INDICATIONS FOR PROCEDURE:  A 55 year old white male with longstanding history of diabetes, hypertension, hyperlipidemia who presents with a non-ST-elevation myocardial infarction.  PROCEDURE:  Left heart catheterization, coronary and left ventricular angiography access via the right radial artery using standard Seldinger technique.  EQUIPMENT:  A 5-French 4-cm right Judkins catheter, 5-French 3.5-cm left Judkins catheter, 5-French pigtail catheter, 5-French arterial sheath.  MEDICATIONS: 1. Local anesthesia 1% Xylocaine. 2. Versed 2 mg IV. 3. Fentanyl 100 mcg IV. 4. Verapamil 3 mg intra-arterial. 5. Heparin 6000 units IV bolus. 6. Contrast 100 mL of Omnipaque.  HEMODYNAMIC DATA:  Aortic pressure is 133/72 with a mean of 96 mmHg. Left ventricular pressure was 138 with a EDP of 12 mmHg.  ANGIOGRAPHIC DATA:  The right coronary artery arises and distributes normally.  It has diffuse 99% stenosis throughout the midvessel both before and after the right ventricular marginal branch.  The left coronary arises and distributes normally.  The left main coronary artery is normal.  The left anterior descending artery has diffuse segmental 60-70% true bifurcation disease involving the proximal LAD and diagonal.  The LAD is then diffusely diseased up to 70-80% after the first diagonal.  Left circumflex coronary has 70% distal disease prior to the terminal posterolateral branch.  There is also 70% stenosis involving the first obtuse marginal vessel.  Left ventricular angiography performed in the RAO view demonstrates diffuse hypokinesis more severe involving the  inferior wall.  Overall, there is modest left ventricular dysfunction with ejection fraction of 40-45%.  FINAL INTERPRETATION: 1. Severe three-vessel atherosclerotic coronary artery disease. 2. Moderate left ventricular dysfunction.  PLAN:  I would recommend revascularization with coronary artery bypass surgery.          ______________________________ Peter M. Swaziland, M.D.     PMJ/MEDQ  D:  11/23/2010  T:  11/24/2010  Job:  [id number]  cc:   Geoffry Paradise, M.D. Thomas C. Daleen Squibb, MD, [facility]  Electronically Signed by PETER Swaziland M.D. on 11/27/2010 01:04:47 PM

## 2010-11-27 NOTE — Consult Note (Signed)
NAMEHOYLE, Spencer [name] NO.:  [id number]  MEDICAL RECORD NO.:  [id number]  LOCATION:  2916                         FACILITY:  [facility]  PHYSICIAN:  Kerin Perna, M.D.  DATE OF BIRTH:  1955/09/21  DATE OF CONSULTATION:  11/23/2010 DATE OF DISCHARGE:                                CONSULTATION   REASON FOR CONSULTATION:  Severe multivessel coronary artery disease with subendocardial MI.  CHIEF COMPLAINT:  Chest pain.  HISTORY OF PRESENT ILLNESS:  I was asked to evaluate this 55 year old obese, diabetic, nonsmoker for further evaluation and treatment of severe multivessel coronary artery disease.  The patient presented to the hospital 24 hours ago after having 48 hours of intermittent substernal chest pain.  This was mainly related to some water exercise at the [facility] pool.  It was substernal, pressing pain with associated shortness of breath.  It recurred on 2 consecutive days, so he presented to the [facility] ED for evaluation.  At that time, he had nonspecific ST-segment changes on his EKG, however, his cardiac enzymes were positive with a CPK-MB of 18 ng/mL and a troponin of 0.9.  He was placed on heparin and nitroglycerin and had serial cardiac enzymes.  He underwent cardiac catheterization on November 23, 2010, which showed a 99% stenosis of the right coronary artery, 80% stenosis of the OM, and 70- 80% stenosis of the LAD diagonal.  LVEF was fairly well preserved.  A 2- D echo is pending to assess valvular disease.  Based on his coronary anatomy and diabetes, a surgical evaluation was requested for coronary surgical revascularization.  The patient is currently stable in the CCU on a heparin drip.  The cath site in the right groin is also stable.  PAST MEDICAL HISTORY: 1. Type 2 diabetes mellitus with an A1c of 7.1. 2. Hypertension. 3. Hyperlipidemia. 4. Morbid obesity. 5. Anxiety. 6. GERD. 7. Chronic back pain, status post multiple lumbar  fusions.  HOME MEDICATIONS: 1. Quinapril 20 mg daily. 2. Lyrica 150 p.o. b.i.d. 3. Sertraline 50 mg daily. 4. Lovaza 1 g b.i.d. 5. Ambien 10 mg nightly. 6. Xanax 0.5 mg q.i.d. 7. Cardizem CD 240 mg daily. 8. Colace 100 mg daily. 9. Oxycodone p.r.n. pain. 10.Aspirin 81 mg daily. 11.Metformin/glyburide 5/500 one b.i.d. 12.Hydrochlorothiazide 25 mg daily.  ALLERGIES:  GI intolerance to CODEINE.  SURGICAL HISTORY:  Multiple lumbar operations by Dr. Barnett Abu.  SOCIAL HISTORY:  The patient is retired and lives with his wife who is a Sports coach at [employer].  He does not smoke or use alcohol.  He uses water aerobic exercise 4-5 days a week.  FAMILY HISTORY:  Positive for hypertension, diabetes, peripheral vascular disease, and coronary artery disease.  REVIEW OF SYSTEMS:  CONSTITUTIONAL:  Negative for fever or weight loss. ENT:  Negative for change in vision or difficulty swallowing.  He has no active dental complaints.  THORACIC:  Negative for history of abnormal chest x-ray, thoracic trauma, or recent symptoms of upper respiratory infection.  CARDIAC:  Positive for coronary artery disease.  Negative for history of murmur or arrhythmia.  GI:  Positive for GERD.  Negative for hepatitis, jaundice, or blood per rectum.  VASCULAR:  Negative for DVT, claudication, or TIA.  NEUROLOGIC:  Significant for a left foot drop resulting from disk compression.  Negative for stroke or seizure. ENDOCRINE:  Positive for diabetes.  Negative for thyroid disease.  PHYSICAL EXAMINATION:  VITAL SIGNS:  The patient is 6 feet tall, weighs 310 pounds, blood pressure 118/80, pulse 85, sinus, and saturation on room air 95%. GENERAL APPEARANCE:  This is a pleasant, obese Caucasian male in the CCU following cardiac cath, in no acute distress. HEENT:  Normocephalic.  Dentition good. NECK:  Without JVD, mass, or carotid bruit. LYMPHATICS:  No palpable adenopathy in the neck or  supraclavicular fossa. THORAX:  Without deformity. LUNGS:  Breath sounds are clear and equal. CARDIAC:  With regular rhythm without S3, gallop, or murmur. ABDOMEN:  Obese, soft, and nontender without pulsatile mass. EXTREMITIES:  No clubbing, cyanosis, or edema.  Peripheral pulses are intact in the extremities and there is no evidence of venous insufficiency of lower extremities. NEUROLOGIC:  Alert and oriented without focal motor deficit, although he does have a mild foot drop on the left side from back disk issues.  LABORATORY DATA:  I reviewed the coronary arteriograms and agree with Dr. Elvis Coil impression of severe multivessel disease in a diabetic patient that would benefit from surgical revascularization.  His creatinine is 1.2.  His hematocrit was 39 pre-cath, 35 post cath' and his chest x-ray shows no active disease.  Two-D echo is pending and pre- CABG Dopplers are pending.  IMPRESSION AND PLAN:  The patient has severe multivessel disease and would benefit from bypass grafts to the right coronary artery to the LAD distribution and to the circumflex.  We will continue his IV heparin and prepare him for surgery on November 26, 2010.  This was discussed with the patient and his wife and they understand and agreed to proceed.     Kerin Perna, M.D.     PV/MEDQ  D:  11/24/2010  T:  11/24/2010  Job:  [id number]  cc:   Tami Blass M. [place], M.D.  Electronically Signed by Kerin Perna M.D. on 11/27/2010 91:47:82 PM

## 2010-11-27 NOTE — Op Note (Signed)
NAMETOLLIE, CANA[place] NO.:  [id number]  MEDICAL RECORD NO.:  [id number]  LOCATION:  2310                         FACILITY:  [facility]  PHYSICIAN:  Kerin Perna, M.D.  DATE OF BIRTH:  May 25, 1955  DATE OF PROCEDURE:  11/26/2010 DATE OF DISCHARGE:                              OPERATIVE REPORT   OPERATIONS: 1. Coronary artery bypass grafting x6 (left internal mammary artery to     LAD (free graft), sequential saphenous vein graft to OM-1 and OM-2,     sequential saphenous vein graft to RV marginal and right coronary     artery, saphenous vein graft to first diagonal). 2. Endoscopic harvest of bilateral leg saphenous vein.  SURGEON:  Kerin Perna, MD  ASSISTANT:  Doree Fudge, PA-C  ANESTHESIA:  General by Dr. Jeanice Lim.  INDICATIONS:  The patient is a 55 year old morbidly obese diabetic male who presents with unstable angina and positive cardiac enzymes.  Cardiac catheterization by Dr. Swaziland demonstrated severe multivessel disease with 99% stenosis of the right coronary.  EF was 45% and he had no significant valvular disease on echo.  He was felt to be a candidate for surgical revascularization.  The patient remained hemodynamically stable before surgery.  Prior to surgery, I reviewed the results of the cardiac cath with the patient and his family.  I discussed the indications and expected benefits of coronary artery bypass grafting for treatment of his coronary artery disease.  I reviewed the alternatives to surgical therapy for treatment of his coronary artery disease.  I discussed the major issues of surgery including the location of the surgical incisions, the use of general anesthesia and cardiopulmonary bypass, and the expected postoperative hospital recovery.  I reviewed with the patient the risks to him of coronary bypass surgery including the risks of stroke, MI, bleeding, blood transfusion requirement, infection, and death.  After  reviewing these issues, he demonstrated his understanding and agreed to proceed with the surgery under what I felt was an informed consent.  OPERATIVE FINDINGS: 1. Due to the patient's obese body habitus, exposure of the heart was     difficult. 2. Adequate quality of conduit, although the vein below the knee was     too small to use bilaterally. 3. Adequate size left mammary artery, but with marginal flow with the     chest retracted so for that reason, I performed a free graft and     placed the proximal anastomosis on the hood of the vein graft to     the circumflex sequential graft.  PROCEDURE:  The patient was brought to the operating room, placed supine on the operating table where general anesthesia was induced.  The chest, abdomen and the legs were prepped with Betadine and draped as a sterile field.  A sternal incision was made as the saphenous vein was harvested endoscopically from both legs.  The left internal mammary artery was harvested first as a pedicle graft from its origin at the subclavian vessels.  However, after dividing the vessel distally, there was suboptimal flow and for that reason, the vessel was doubly ligated and divided proximally, probed and flushed and found to be an  adequate vessel.  It was used as a free graft.  The sternal retractor was then placed using the deep blades due to the patient's obese body habitus.  The pericardium was opened and suspended. The heart was inspected and I saw no evidence of scarring or myocardial injury.  After the veins were harvested, the patient was heparinized and pursestrings were placed in the ascending aorta, right atrium and the patient was cannulated and placed on cardiopulmonary bypass. Cardioplegic cannulas were then placed for both antegrade and retrograde cold blood cardioplegia and the coronaries were identified for grafting. The RV marginal branch to the right coronary, the distal right coronary, the LAD and  first diagonal, the OM-1 and distal circumflex were found to be adequate targets.  The distal circumflex was small 1.2-mm vessel. The diagonal branch of the LAD had a tight proximal calcified stenosis and the vessel was approximately 1.2-1.4 mm.  The other vessels were 1.5- mm vessels.  The patient was cooled to 32 degrees and the aortic crossclamp was applied.  A 850 mL of cold blood cardioplegia was delivered in split doses between the antegrade aortic and retrograde coronary sinus catheters.  There is good cardioplegic arrest and septal temperature dropped less than 14 degrees.  Cardioplegia was delivered every 20 minutes or less while the crossclamp was in place. The distal coronary anastomoses were then performed.  The first distal anastomosis was the vein sequentially placed at the RV marginal into the right coronary.  The RV marginal was 1.5-mm vessel at proximal 99% stenosis.  The vein was sewn side-to-side with running 7 Prolene with good flow through the graft.  The second distal anastomosis was a continuation of the sequential vein graft to the right coronary artery. This is a 1.4-mm vessel and proximal 99% stenosis.  It did have some plaque in its side wall.  The end of the vein was sewn end-to-side with a running 7-0 Prolene.  There is good flow through the graft. Cardioplegia was dosed through this graft.  The third and fourth grafts were then placed as a single sequential vein to the OM-1 and OM-2.  The OM-1 was a larger 1.5-mm vessel proximal 80% stenosis and this vein was sewn side-to-side with running 7 Prolene with good flow through the graft.  It was then continued with sequential vein graft to the distal circumflex or posterolateral branch of the left coronary which was 1.2- mm vessel.  The end of the vein was sewn end-to-side with running 7-0 Prolene and there is good flow through the graft and cardioplegia was delivered.  The fifth distal anastomosis with the first  diagonal branch to the LAD.  There is a 1.2-1.4 mm vessel with a tight proximal calcified stenosis.  The end of vein was sewn end-to-side with running 7- 0 Prolene and cardioplegia was delivered.  The fifth distal anastomosis was the free left IMA graft to the distal third of the LAD.  Here is a 1.5-mm vessel.  There is a proximal 80% stenosis.  The  mammary artery was sewn end-to-side with running 8-0 Prolene.  There is good flow through the graft.  Cardioplegia was redosed.  While the cross-clamp was still in place, 3 proximal vein anastomoses were performed using a 4.0-mm punch running 7-0 Prolene.  Prior to tying down the final proximal anastomosis, air was vented from the coronaries with a dose of retrograde warm blood cardioplegia and the crossclamp was removed. The heart resumed by spontaneous rhythm.  Proximal distal anastomoses were  checked and found to be hemostatic.  Two atraumatic vascular bulldog was then placed on the proximal portion of the sequential circumflex vein graft and the mammary pedicle was sewn end-to-side with running 7-0 Prolene to this vein instead of the aorta which was somewhat thickened.  The bulldog was removed and there is good flow through the mammary artery as well as the distal vein.  At this point, the patient was rewarmed and temporary pacing wires were applied.  The lungs were expanded.  The ventilator was resumed.  The patient was then weaned off bypass without difficulty with stable hemodynamics and cardiac output of 6-8 L/minute.  Protamine was administered without adverse reaction.  The cannula was removed.  The mediastinum was irrigated with warm saline.  The superior pericardial fat was closed over the aorta.  An anterior mediastinal and left pleural chest tube were placed and brought through separate incisions.  The sternum was then closed with interrupted steel wire.  This was very difficult due to the patient's extremely thick and  hard sternum.  The rib drill was used to get through the manubrium.  After the wires were placed, the wound was irrigated.  The fascia was closed in running #1 Vicryl.  The subcutaneous and skin layers were closed in running Vicryl and sterile dressings were applied.  Total cardiopulmonary bypass time was 165 minutes.     Kerin Perna, M.D.     PV/MEDQ  D:  11/26/2010  T:  11/27/2010  Job:  [id number]  cc:   Thomas C. Daleen Squibb, MD, [facility]  Electronically Signed by Kerin Perna M.D. on 11/27/2010 05:29:59 PM

## 2010-11-28 ENCOUNTER — Inpatient Hospital Stay (HOSPITAL_COMMUNITY): Payer: 59

## 2010-11-28 LAB — POCT I-STAT, CHEM 8
BUN: 18 mg/dL (ref 6–23)
Chloride: 99 mEq/L (ref 96–112)
Creatinine, Ser: 0.7 mg/dL (ref 0.50–1.35)
Glucose, Bld: 163 mg/dL — ABNORMAL HIGH (ref 70–99)
Potassium: 3.9 mEq/L (ref 3.5–5.1)

## 2010-11-28 LAB — BASIC METABOLIC PANEL
BUN: 17 mg/dL (ref 6–23)
CO2: 26 mEq/L (ref 19–32)
Chloride: 98 mEq/L (ref 96–112)
Creatinine, Ser: 0.7 mg/dL (ref 0.50–1.35)
GFR calc Af Amer: >90 mL/min (ref >90)
Glucose, Bld: 162 mg/dL — ABNORMAL HIGH (ref 70–99)

## 2010-11-28 LAB — CBC
HCT: 27.5 % — ABNORMAL LOW (ref 39.0–52.0)
Hemoglobin: 9.1 g/dL — ABNORMAL LOW (ref 13.0–17.0)
MCV: 84.9 fL (ref 78.0–100.0)
RDW: 14 % (ref 11.5–15.5)
WBC: 15.9 10*3/uL — ABNORMAL HIGH (ref 4.0–10.5)

## 2010-11-28 LAB — GLUCOSE, CAPILLARY
Glucose-Capillary: 112 mg/dL — ABNORMAL HIGH (ref 70–99)
Glucose-Capillary: 157 mg/dL — ABNORMAL HIGH (ref 70–99)
Glucose-Capillary: 185 mg/dL — ABNORMAL HIGH (ref 70–99)

## 2010-11-29 ENCOUNTER — Inpatient Hospital Stay (HOSPITAL_COMMUNITY): Payer: 59

## 2010-11-29 DIAGNOSIS — E1165 Type 2 diabetes mellitus with hyperglycemia: Secondary | ICD-10-CM

## 2010-11-29 DIAGNOSIS — IMO0001 Reserved for inherently not codable concepts without codable children: Secondary | ICD-10-CM

## 2010-11-29 LAB — CROSSMATCH
ABO/RH(D): B POS
Antibody Screen: NEGATIVE
Unit division: 0
Unit division: 0

## 2010-11-29 LAB — CBC
HCT: 27.2 % — ABNORMAL LOW (ref 39.0–52.0)
Hemoglobin: 9.1 g/dL — ABNORMAL LOW (ref 13.0–17.0)
MCH: 28.6 pg (ref 26.0–34.0)
MCHC: 33.5 g/dL (ref 30.0–36.0)
RBC: 3.18 MIL/uL — ABNORMAL LOW (ref 4.22–5.81)

## 2010-11-29 LAB — GLUCOSE, CAPILLARY
Glucose-Capillary: 163 mg/dL — ABNORMAL HIGH (ref 70–99)
Glucose-Capillary: 118 mg/dL — ABNORMAL HIGH (ref 70–99)
Glucose-Capillary: 109 mg/dL — ABNORMAL HIGH (ref 70–99)

## 2010-11-29 LAB — BASIC METABOLIC PANEL
BUN: 20 mg/dL (ref 6–23)
CO2: 27 mEq/L (ref 19–32)
Calcium: 8.5 mg/dL (ref 8.4–10.5)
GFR calc non Af Amer: >90 mL/min (ref >90)
Glucose, Bld: 132 mg/dL — ABNORMAL HIGH (ref 70–99)
Potassium: 4.2 mEq/L (ref 3.5–5.1)

## 2010-11-30 LAB — CBC
HCT: 25.9 % — ABNORMAL LOW (ref 39.0–52.0)
MCH: 28.4 pg (ref 26.0–34.0)
MCHC: 33.2 g/dL (ref 30.0–36.0)
MCV: 85.5 fL (ref 78.0–100.0)
RDW: 14.2 % (ref 11.5–15.5)

## 2010-11-30 LAB — GLUCOSE, CAPILLARY
Glucose-Capillary: 119 mg/dL — ABNORMAL HIGH (ref 70–99)
Glucose-Capillary: 120 mg/dL — ABNORMAL HIGH (ref 70–99)

## 2010-12-01 LAB — CBC
HCT: 25.9 % — ABNORMAL LOW (ref 39.0–52.0)
Hemoglobin: 8.5 g/dL — ABNORMAL LOW (ref 13.0–17.0)
RBC: 3.02 MIL/uL — ABNORMAL LOW (ref 4.22–5.81)
WBC: 8.8 10*3/uL (ref 4.0–10.5)

## 2010-12-01 LAB — GLUCOSE, CAPILLARY: Glucose-Capillary: 121 mg/dL — ABNORMAL HIGH (ref 70–99)

## 2010-12-02 LAB — WOUND CULTURE
Culture: NO GROWTH
Gram Stain: NONE SEEN

## 2010-12-03 LAB — CARDIAC PANEL(CRET KIN+CKTOT+MB+TROPI)
CK, MB: 1.5
CK, MB: 1.7
Total CK: 126
Troponin I: 0.02

## 2010-12-03 LAB — POCT CARDIAC MARKERS
CKMB, poc: <1 — ABNORMAL LOW
Myoglobin, poc: 28.5
Operator id: 146091
Operator id: 288831
Troponin i, poc: <0.05
Troponin i, poc: <0.05

## 2010-12-03 LAB — DIFFERENTIAL
Basophils Absolute: 0
Basophils Relative: 0
Lymphocytes Relative: 20
Neutro Abs: 6
Neutrophils Relative %: 71

## 2010-12-03 LAB — URINALYSIS, ROUTINE W REFLEX MICROSCOPIC
Bilirubin Urine: NEGATIVE
Glucose, UA: NEGATIVE
Hgb urine dipstick: NEGATIVE
Specific Gravity, Urine: 1.035 — ABNORMAL HIGH
Urobilinogen, UA: 0.2
pH: 6

## 2010-12-03 LAB — CBC
HCT: 39
Hemoglobin: 13.2
MCV: 84.3
Platelets: 333
WBC: 8.5

## 2010-12-03 LAB — COMPREHENSIVE METABOLIC PANEL
Albumin: 3.7
Alkaline Phosphatase: 72
BUN: 18
Chloride: 105
Creatinine, Ser: 0.77
GFR calc non Af Amer: >60
Glucose, Bld: 120 — ABNORMAL HIGH
Potassium: 3.9
Total Bilirubin: 0.7

## 2010-12-03 LAB — I-STAT 8, (EC8 V) (CONVERTED LAB)
BUN: 20
Glucose, Bld: 46 — ABNORMAL LOW
HCT: 44
Hemoglobin: 15
Operator id: 146091
Potassium: 3.9
Sodium: 141

## 2010-12-03 LAB — LIPID PANEL
Cholesterol: 160
Total CHOL/HDL Ratio: 6.7

## 2010-12-03 LAB — POCT I-STAT CREATININE: Creatinine, Ser: 0.9

## 2010-12-07 ENCOUNTER — Encounter: Payer: Self-pay | Admitting: Physician Assistant

## 2010-12-09 NOTE — H&P (Signed)
Spencer [name], BEHAR NO.:  [id number]  MEDICAL RECORD NO.:  [id number]  LOCATION:  2916                         FACILITY:  [facility]  PHYSICIAN:  Jesse Sans. Mikayela Deats, MD, FACCDATE OF BIRTH:  1956-02-17  DATE OF ADMISSION:  11/22/2010 DATE OF DISCHARGE:                             HISTORY & PHYSICAL   PRIMARY CARDIOLOGIST:  [facility], [facility], being seen by Jesse Sans. Lorrain Rivers, MD, [facility].  PRIMARY CARE PHYSICIAN:  Geoffry Paradise, MD  PATIENT PROFILE:  A 55 year old obese male without prior cardiac history presents with non-ST-elevation MI.  PROBLEMS: 1. Non-ST segment elevation myocardial infarction. 2. Hypertension. 3. Hyperlipidemia. 4. Morbid obesity. 5. Diabetes mellitus. 6. Irritable bowel syndrome. 7. Anxiety. 8. Chronic back pain. 9. Gastroesophageal reflux disease.  ALLERGIES:  CODEINE.  HISTORY OF PRESENT ILLNESS:  A 55 year old Caucasian male with the above problem list.  The patient was in his usual state of health until earlier this week when he was at the [facility], treading water in the pool and had substernal chest pressure with associated dyspnea.  Symptoms persisted for some time and then eventually resolved when the patient got out of the pool.  He had no recurrent symptoms on Tuesday, but yesterday, the patient was laying down for bed and had sudden onset of substernal chest pressure with dyspnea.  Symptoms were similar to what he had in the pool on Monday.  Symptoms persisted about 30-45 minutes prior to resolving spontaneously.  The patient slept well, and then this morning went back to the [facility] and was treading water for about 50 minutes and developed recurrent substernal chest discomfort.  It was more severe this time and again resolved after he got out of the pool. At this point, he decided to drive himself to the [facility] ED.  Here, ECG shows no acute changes and he was found to have a troponin 0.91 with CK-MB of 18.1 and CK of 363.   We have been asked to evaluate. Currently, he is painfree.  HOME MEDICATIONS: 1. Quinapril 20 mg daily. 2. Maalox p.r.n. 3. Lyrica 150 mg b.i.d. 4. Sertraline 50 mg daily. 5. Lovaza 1 g b.i.d. 6. Zolpidem 10 mg at bedtime. 7. Alprazolam 0.5 mg q.6 h p.r.n. 8. Diltiazem XT 240 mg daily. 9. Docusate 100 mg daily. 10.Oxycodone/APAP 5/325 mg q.4 h p.r.n. 11.Aspirin 81 mg daily. 12.Glyburide/metformin 5/500 mg b.i.d. 13.Methocarbamol 500 mg t.i.d. p.r.n. 14.Hydrochlorothiazide 25 mg daily.  FAMILY HISTORY:  Mother had a history of diabetes, hypertension, and peripheral vascular disease.  Father had history of coronary artery disease and lung cancer died at 3.  He has a brother with diabetes and coronary artery disease.  Another brother with unknown health history and a sister who is alive and well.  SOCIAL HISTORY:  The patient lives in [place] with his wife.  He is on disability.  They have one grown daughter.  He denies tobacco, alcohol, or drug use.  He is routinely exercising with water aerobics.  REVIEW OF SYSTEMS:  Positive for chest pressure.  He also has a history of anxiety.  He is a full code.  Otherwise, all systems reviewed and negative.  PHYSICAL EXAMINATION:  VITAL  SIGNS:  The patient is afebrile, heart rate 85, respirations 15, blood pressure 117/81, pulse ox 100% on 3 liters. GENERAL:  A pleasant white male in no acute distress.  Awake, alert, and oriented x3. HEENT:  Normal.  Nares grossly intact.  Nonfocal. SKIN:  Warm and dry without lesions or masses.  Supple without JVD. LUNGS:  Respirations were unlabored.  Clear to auscultation. CARDIAC:  Regular S1 and S2.  No S3, S4, or murmurs. ABDOMEN:  Round, soft, nontender, nondistended.  Bowel sounds present. EXTREMITIES:  Warm, dry, pink.  No clubbing, cyanosis, or edema. Dorsalis pedis, posterior tibial, and radial pulses are 2+ and equal bilaterally.  Chest x-ray shows mild retrocardiac opacity likely  atelectasis.  EKG shows sinus rhythm with PVC and a rate of 90.  Normal axis.  No acute ST- T changes.  Hemoglobin 13.7, hematocrit 39.5, WBC 7.1, platelets 242. Sodium 135, potassium 4.0, chloride 99, CO2 of 26, BUN 21, creatinine 21, glucose 132.  CK 363, MB 18.1, troponin I 0.91.  Calcium 10.0.  ASSESSMENT AND PLAN: 1. Non-ST-elevation myocardial infarction.  The patient presents with     recurrent rest and exertional substernal chest discomfort at this     time with positive cardiac markers with abnormal ECG.  He is     currently painfree.  He has been bolused with heparin.  We will     continue heparin infusion, also add nitroglycerin paste, and     advance to infusion of necessary.  We will add beta-blocker therapy     and otherwise continue with ACE inhibitor and aspirin.  Add high-     dose statin.  We will plan catheterization in the morning or sooner     if necessary. 2. Diabetes mellitus.  Hold metformin, add sliding-scale insulin.     Continue with glyburide. 3. Lipids, currently unknown.  Check lipids and LFTs.  Add high-dose     statin. 4. Hypertension, stable on long-term diltiazem CD,     hydrochlorothiazide, and ACE inhibitor.  We will back off     his diltiazem dose as we are adding beta-blocker in the setting of     myocardial infarction.  Follow closely and titrate as needed.  Hold     hydrochlorothiazide pending catheterization. 5. Obesity.  The patient will need to see Cardiac Rehab.     Nicolasa Ducking, ANP   ______________________________ Jesse Sans. Daleen Squibb, MD, [facility]    CB/MEDQ  D:  11/22/2010  T:  11/23/2010  Job:  [id number]  Electronically Signed by Nicolasa Ducking ANP on 11/28/2010 07:17:29 PM Electronically Signed by [name] MD [facility] on 12/09/2010 01:51:46 PM

## 2010-12-10 NOTE — Discharge Summary (Signed)
NAMEHAREL, REPETTO NO.:  [id number]  MEDICAL RECORD NO.:  [id number]  LOCATION:  2006                         FACILITY:  [facility]  PHYSICIAN:  Kerin Perna, M.D.  DATE OF BIRTH:  September 22, 1955  DATE OF ADMISSION:  11/22/2010 DATE OF DISCHARGE:  12/01/2010                              DISCHARGE SUMMARY   PRIMARY ADMITTING DIAGNOSIS:  Chest pain.  ADDITIONAL/DISCHARGE DIAGNOSES: 1. Non-ST elevation myocardial infarction. 2. Coronary artery disease. 3. Hypertension. 4. Hyperlipidemia. 5. Type 2 diabetes mellitus. 6. Irritable bowel syndrome. 7. Chronic back pain, status post lumbar fusion. 8. Gastroesophageal reflux disease. 9. Anxiety. 10.Obesity.  PROCEDURES PERFORMED: 1. Cardiac catheterization. 2. Coronary artery bypass grafting x6 (left internal mammary artery to     the left anterior descending - free graft, sequential saphenous     vein graft to the first and second obtuse marginals, sequential     saphenous vein graft to the right ventricular marginal in the right     coronary artery, saphenous vein graft to the first diagonal). 3. Endoscopic vein harvest, bilateral leg.  HISTORY:  The patient is a 55 year old male with no prior history of coronary artery disease.  He presented to the emergency department on the date of this admission after 48 hours of intermittent substernal chest discomfort.  It had begun while he was exercising at the [facility] pool.  It was associated with shortness of breath and recur intermittently over the course of 2 days.  On evaluation in the ED, he was noted to have nonspecific ST-segment changes on the EKG, however, his cardiac enzymes were positive with CK-MB of 18 and troponin of 0.9. He was seen by Cardiology and was started on heparin and nitroglycerin and was admitted for further evaluation.  HOSPITAL COURSE:  [name] underwent cardiac catheterization on November 23, 2010, which showed a 99% stenosis of the right  coronary artery, an 80% stenosis of the OM, 70-80% stenosis of the LAD diagonal. Left ventricular ejection fraction was well preserved.  A two-D echocardiogram showed no significant valvular disease.  Based on his coronary anatomy and his other comorbidities, a surgical consult was requested.  Dr. Kathlee Nations Trigt saw the patient, reviewed his films, and agreed with the need for coronary artery bypass grafting.  He explained all risks, benefits, and alternatives in detail to the patient and the patient agreed to proceed.  He was taken to the operating room on November 26, 2010, and underwent coronary artery bypass grafting as described above.  Please see previously dictated operative report for details of surgery.  He tolerated the procedure well and was transferred to the SICU in stable condition.  He was extubated shortly after surgery.  He was hemodynamically stable and doing well on postop day 1. He did remain in the unit for an additional 24 hours observation and by postop day 2 was ready for transfer to the step-down unit.  His postoperative course has generally been uneventful.  He has been started on Lasix for postoperative volume overload and is responding well to diuresis.  He has been restarted on his home diabetes medications and his blood sugars at the present are remaining very  stable running in the 110s to 130s.  He has been started on Plavix which he will continue for 30 days as well as a baby aspirin, a beta-blocker, and ACE inhibitor, and a statin for his non STEMI.  He has been ambulating in the halls without difficulty.  He has been weaned from supplemental oxygen and is maintaining sats of greater than 90% on room air.  He did develop some sternal drainage on postop day 4 without significant erythema around his sternal wound.  Gram stain was negative but he was placed empirically on p.o. Keflex.  His most recent labs show hemoglobin 8.5, hematocrit 25.9, platelets 230,  white count 8.8, sodium 138, potassium 4.2, BUN 20, creatinine 0.7.  His latest chest x-ray showed bibasilar atelectasis. He progressed well and it is felt that on postop day 5, December 01, 2010, he may be discharged home.  DISCHARGE MEDICATIONS: 1. Keflex 500 mg t.i.d. x5 days. 2. Plavix 75 mg daily x30 days. 3. Lasix 40 mg daily x7 days. 4. Potassium 20 mEq daily x7 days. 5. Lisinopril 20 mg daily. 6. Lopressor 25 mg b.i.d. 7. Oxycodone IR 5-10 mg q.3 h. p.r.n. pain. 8. Crestor 40 mg at bedtime. 9. Xanax 0.4 mg q.6 h. p.r.n. anxiety. 10.Enteric-coated aspirin 81 mg daily. 11.Colace 200 mg daily. 12.Glyburide/metformin 5/500 one-two b.i.d. as directed. 13.Lovaza 1 g 1-2 capsules b.i.d. as directed. 14.Lyrica 150 mg b.i.d. 15.Methocarbamol 500 mg t.i.d. p.r.n. 16.MiraLax 17 g at bedtime p.r.n. 17.Ranitidine 150 mg daily. 18.Sertraline 50 mg b.i.d. 19.Tramadol 50 mg q.6 h. p.r.n. pain. 20.Vitamin B6 100 mg daily. 21.Vitamin C 500 mg 2 capsules daily. 22.Ambien 10 mg 1-1/2 to 1 tab at bedtime p.r.n. sleep.  DISCHARGE INSTRUCTIONS:  He is asked to refrain from driving, heavy lifting, or strenuous activity.  He may continue ambulating daily and using his incentive spirometer.  He may shower daily and clean his incisions with soap and water.  He will continue his same preoperative diet.  DISCHARGE FOLLOWUP:  He will return to the office to see Dr. Daleen Squibb in 2 weeks.  He will then follow up in 3 weeks with Dr. [name] with a chest x-ray.  In the interim, he is asked to contact our office if he experiences any problems or questions.     Coral Ceo, P.A.   ______________________________ Kerin Perna, M.D.    GC/MEDQ  D:  12/01/2010  T:  12/01/2010  Job:  [id number]  cc:   Geoffry Paradise, M.D. Thomas C. Wall, MD, [facility] Office  Electronically Signed by Weldon Inches. on 12/05/2010 10:39:56 AM Electronically Signed by Kerin Perna M.D. on 12/10/2010 08:00:10  AM

## 2010-12-11 ENCOUNTER — Encounter: Payer: Self-pay | Admitting: Physician Assistant

## 2010-12-11 ENCOUNTER — Ambulatory Visit (HOSPITAL_COMMUNITY)
Admission: RE | Admit: 2010-12-11 | Discharge: 2010-12-11 | Disposition: A | Discharge status: Home / Self Care | Payer: 59 | Source: Ambulatory Visit | Attending: Cardiology | Admitting: Cardiology

## 2010-12-11 ENCOUNTER — Ambulatory Visit (INDEPENDENT_AMBULATORY_CARE_PROVIDER_SITE_OTHER): Payer: 59 | Admitting: Physician Assistant

## 2010-12-11 VITALS — BP 112/56 | HR 90 | Ht 73.0 in | Wt 298.8 lb

## 2010-12-11 DIAGNOSIS — J90 Pleural effusion, not elsewhere classified: Secondary | ICD-10-CM | POA: Insufficient documentation

## 2010-12-11 DIAGNOSIS — E119 Type 2 diabetes mellitus without complications: Secondary | ICD-10-CM

## 2010-12-11 DIAGNOSIS — I517 Cardiomegaly: Secondary | ICD-10-CM | POA: Insufficient documentation

## 2010-12-11 DIAGNOSIS — I2510 Atherosclerotic heart disease of native coronary artery without angina pectoris: Secondary | ICD-10-CM

## 2010-12-11 DIAGNOSIS — R0602 Shortness of breath: Secondary | ICD-10-CM | POA: Insufficient documentation

## 2010-12-11 DIAGNOSIS — I10 Essential (primary) hypertension: Secondary | ICD-10-CM

## 2010-12-11 DIAGNOSIS — E785 Hyperlipidemia, unspecified: Secondary | ICD-10-CM

## 2010-12-11 MED ORDER — POTASSIUM CHLORIDE CRYS ER 20 MEQ PO TBCR: EXTENDED_RELEASE_TABLET | ORAL | Status: DC

## 2010-12-11 MED ORDER — FUROSEMIDE 40 MG PO TABS: ORAL_TABLET | ORAL | Status: DC

## 2010-12-11 NOTE — Progress Notes (Signed)
History of Present Illness: Primary Cardiologist:  Dr. [name] PCP:  Dr. [name] [name] is a 55 y.o. male who presents for post hospital follow up.  He was admitted 10/4-10/13.  He presented with chest pain and SOB.  He ruled in for NSTEMI.  LHC 11/24/10: mRCA 99% (diffuse), pLAD and Dx bifurcational lesion 60-70%, LAD 70-80% after Dx, dCFX 70%, OM1 70%, EF 40-45%.  Echo 11/24/10: EF normal, mild LVH.  He was referred for CABG with Dr. [name].  Grafts: L-LAD, S-OM1/OM2, S-RV marg/RCA.  Post op course was uneventful.  He remained in NSR.  He did have some volume overload and acute blood loss anemia.  Pertinent labs: Hgb 8.5, K 4.2, creatinine 0.70, ALT 35, TC 201, TG 328, HDL 27, LDL 108, TSH 2.161.  Last CXR 10/11 demonstrated a small left pleural effusion.    He notes increased DOE.  He thinks he felt better on Lasix.  He is walking 20 minutes 3x per day.  No orthopnea, PND or edema.  No cough.  No fevers or chills.  Chest is sore.  Otherwise, no recurrent angina.  His weights have been up and down since coming home.  He is not sure what his weight has been recently.  He sees Dr. [name] in 2 weeks and states he was told to hold off on cardiac rehab until then.    Past Medical History  Diagnosis Date  . Morbidly obese   . Coronary artery disease     NSTEMI 10/12: LHC 11/24/10: mRCA 99% (diffuse), pLAD and Dx bifurcational lesion 60-70%, LAD 70-80% after Dx, dCFX 70%, OM1 70%, EF 40-45%.  Echo 11/24/10: EF normal, mild LVH;  s/p CABG with L-LAD, S-OM1/OM2, S-RV marg/RCA.  . Diabetes mellitus     TYPE II  . Hypertension   . Hyperlipidemia   . Anxiety   . GERD (gastroesophageal reflux disease)   . Back pain, chronic     s/p lumbar fusion  . IBS (irritable bowel syndrome)     Current Outpatient Prescriptions  Medication Sig Dispense Refill  . ALPRAZolam (XANAX) 0.5 MG tablet Take 0.5 mg by mouth every 6 (six) hours as needed.        Marland Kitchen aspirin EC 81 MG tablet Take 81 mg by mouth  daily.        . clopidogrel (PLAVIX) 75 MG tablet Take 75 mg by mouth daily.        Marland Kitchen docusate sodium (COLACE) 100 MG capsule Take 200 mg by mouth at bedtime.        . furosemide (LASIX) 40 MG tablet TAKE 1 TABLET DAILY FOR 1 WEEK THEN STOP  30 tablet  0  . glyBURIDE-metformin (GLUCOVANCE) 5-500 MG per tablet Take 1 tablet by mouth 2 (two) times daily with a meal.        . lisinopril (PRINIVIL,ZESTRIL) 20 MG tablet Take 20 mg by mouth daily.        . methocarbamol (ROBAXIN) 500 MG tablet Take 500 mg by mouth 3 (three) times daily as needed.        . metoprolol tartrate (LOPRESSOR) 25 MG tablet Take 25 mg by mouth 2 (two) times daily.        Marland Kitchen omega-3 acid ethyl esters (LOVAZA) 1 G capsule Take 2 g by mouth 2 (two) times daily.        . polyethylene glycol powder (MIRALAX) powder Take 17 g by mouth at bedtime as needed.        Marland Kitchen  potassium chloride SA (KLOR-CON M20) 20 MEQ tablet TAKE FOR 1 TABLET DAILY FOR 1 WEEK THEN STOP  30 tablet  0  . pregabalin (LYRICA) 150 MG capsule Take 150 mg by mouth 2 (two) times daily.        Marland Kitchen pyridOXINE (VITAMIN B-6) 100 MG tablet Take 100 mg by mouth daily.        . ranitidine (ZANTAC) 150 MG tablet Take 150 mg by mouth daily.        . rosuvastatin (CRESTOR) 40 MG tablet Take 40 mg by mouth daily.        . sertraline (ZOLOFT) 50 MG tablet Take 50 mg by mouth 2 (two) times daily.        . traMADol (ULTRAM) 50 MG tablet Take 50 mg by mouth every 6 (six) hours as needed.        . vitamin C (ASCORBIC ACID) 500 MG tablet Take 1,000 mg by mouth daily.        Marland Kitchen zolpidem (AMBIEN) 10 MG tablet Take 10 mg by mouth at bedtime as needed.          Allergies: Allergies  Allergen Reactions  . Codeine     REACTION: HYPER    Social Hx:  Non smoker  ROS:  Please see the history of present illness.  All other systems reviewed and negative.   Vital Signs: BP 112/56  Pulse 90  Ht 6\' 1"  (1.854 m)  Wt 298 lb 12.8 oz (135.535 kg)  BMI 39.42 kg/m2  PHYSICAL EXAM: Well  nourished, well developed, in no acute distress HEENT: normal Neck: JVP 6 cm Cardiac:  normal S1, S2; RRR; no murmur, no S3 Lungs:  Decreased breath sounds left base with + egophony, no wheezing, rhonchi or rales Abd: soft, nontender, no hepatomegaly Ext: no edema; right radial site well healed Skin: warm and dry Neuro:  CNs 2-12 intact, no focal abnormalities noted Psych: normal affect  EKG:  NSR, HR 90, normal axis, PRWP, NSSTTW changes  ASSESSMENT AND PLAN:

## 2010-12-11 NOTE — Patient Instructions (Signed)
Your physician recommends that you schedule a follow-up appointment in: 6 WEEK WITH DR. WALL PER SCOTT WEAVER, PA-C  A chest x-ray DX 786.05 SOB. takes a picture of the organs and structures inside the chest, including the heart, lungs, and blood vessels. This test can show several things, including, whether the heart is enlarges; whether fluid is building up in the lungs; and whether pacemaker / defibrillator leads are still in place.  Your physician recommends that you return for lab work in: TODAY BMET, BNP, CBC W/DIFF 414.01 CAD, 786.05 SOB  Your physician recommends that you return for lab work in: 12/18/10 REPEAT BMET, BNP 786.05 SOB, CAD 414.01   Your physician recommends that you return for lab work in: 01/21/11 FASTING LIVER/LIPID PANEL 272.4 HYPERLIPIDEMIA  Your physician has recommended you make the following change in your medication: LASIX 40 MG 1 TABLET DAILY FOR 1 WEEK THEN STOP; POTASSIUM 20 MEQ 1 TABLET DAILY FOR 1 WEEK THEN STOP

## 2010-12-11 NOTE — Assessment & Plan Note (Signed)
I believe he is still somewhat volume overloaded with probable persistent left effusion.  I will get a BMET, BNP, CBC and a CXR.  Restart Lasix 40 mg QD and K+ 20 mEq QD for 7 days.  Repeat BMET and BNP in one week.  He has follow up with Dr. [name] in 2 weeks.  If his effusion is much worse, we will get him in for earlier follow up.

## 2010-12-11 NOTE — Assessment & Plan Note (Signed)
Check lipids and LFTs in 6 weeks. 

## 2010-12-11 NOTE — Assessment & Plan Note (Signed)
Controlled.  

## 2010-12-11 NOTE — Assessment & Plan Note (Signed)
Management per PCP

## 2010-12-11 NOTE — Assessment & Plan Note (Signed)
Steady progression post CABG.  Continue ASA and Plavix in setting of NSTEMI at presentation.  Await clearance for cardiac rehab from Dr. [name].  He may be pushing himself at home too much.  I advised him to cut back some on his activity and to slowly increase as tolerated.  Follow up with Dr. Daleen Squibb in 6 weeks.

## 2010-12-12 ENCOUNTER — Telehealth: Payer: Self-pay | Admitting: Cardiology

## 2010-12-12 LAB — CBC WITH DIFFERENTIAL/PLATELET
Basophils Absolute: 0.1 10*3/uL (ref 0.0–0.1)
Eosinophils Absolute: 0.5 10*3/uL (ref 0.0–0.7)
Lymphocytes Relative: 13.6 % (ref 12.0–46.0)
MCHC: 33 g/dL (ref 30.0–36.0)
Neutrophils Relative %: 77.3 % — ABNORMAL HIGH (ref 43.0–77.0)
Platelets: 462 10*3/uL — ABNORMAL HIGH (ref 150.0–400.0)
RBC: 3.11 Mil/uL — ABNORMAL LOW (ref 4.22–5.81)
RDW: 15.5 % — ABNORMAL HIGH (ref 11.5–14.6)

## 2010-12-12 LAB — BASIC METABOLIC PANEL
BUN: 13 mg/dL (ref 6–23)
Calcium: 9 mg/dL (ref 8.4–10.5)
GFR: 91.3 mL/min (ref >60.00)
Glucose, Bld: 97 mg/dL (ref 70–99)
Sodium: 136 mEq/L (ref 135–145)

## 2010-12-12 NOTE — Telephone Encounter (Signed)
I spoke with the pt and made him aware of lab and x-ray results.  Per Tereso Newcomer PA-C he would like the pt to see Dr [name] for enlarging pleural effusion this week.  The pt's potassium level was also borderline high and Scott would like the pt to decrease potassium to 10 mEq daily.  I made the pt aware of this information and I will call [facility] and arrange an appointment for the pt to be seen this week.

## 2010-12-12 NOTE — Telephone Encounter (Signed)
Pt is calling about chest xray and blood work he had yesterday He would like a call today

## 2010-12-13 ENCOUNTER — Encounter: Payer: Self-pay | Admitting: Cardiothoracic Surgery

## 2010-12-13 ENCOUNTER — Other Ambulatory Visit: Payer: Self-pay

## 2010-12-13 ENCOUNTER — Ambulatory Visit (INDEPENDENT_AMBULATORY_CARE_PROVIDER_SITE_OTHER): Payer: Self-pay | Admitting: Cardiothoracic Surgery

## 2010-12-13 VITALS — BP 126/77 | HR 82 | Resp 20 | Ht 73.5 in | Wt 287.0 lb

## 2010-12-13 DIAGNOSIS — I2510 Atherosclerotic heart disease of native coronary artery without angina pectoris: Secondary | ICD-10-CM

## 2010-12-13 DIAGNOSIS — Z951 Presence of aortocoronary bypass graft: Secondary | ICD-10-CM

## 2010-12-13 DIAGNOSIS — J90 Pleural effusion, not elsewhere classified: Secondary | ICD-10-CM

## 2010-12-13 NOTE — Telephone Encounter (Signed)
I left a message for the triage nurse with [facility] to call be about this pt.

## 2010-12-13 NOTE — Telephone Encounter (Signed)
[facility] called back and scheduled the pt to come into the office today at 3:15 with Dr [name]. I made the pt aware of appointment.

## 2010-12-13 NOTE — Patient Instructions (Signed)
Take lasix and zaroxyln as directed for the small L pleural effusion

## 2010-12-13 NOTE — Progress Notes (Signed)
[address]            [place] [address]          [phone]    HPI  The patient returns for a office followup after undergoing successful bypass grafting approximately 2 weeks ago. He was seen in the cardiology office earlier this week and was noted to have a mild left pleural effusion. He is resumed on oral Lasix. He returns here for evaluation of the effusion. He shortness of breath has improved since he was placed on Lasix. He has no orthopnea. His weight has been decreasing. He denies fever. The surgical incisions are healing well.    Current Outpatient Prescriptions  Medication Sig Dispense Refill  . ALPRAZolam (XANAX) 0.5 MG tablet Take 0.5 mg by mouth every 6 (six) hours as needed.        Marland Kitchen aspirin EC 81 MG tablet Take 81 mg by mouth daily.        . clopidogrel (PLAVIX) 75 MG tablet Take 75 mg by mouth daily.        Marland Kitchen docusate sodium (COLACE) 100 MG capsule Take 200 mg by mouth at bedtime.        . furosemide (LASIX) 40 MG tablet TAKE 1 TABLET DAILY FOR 1 WEEK THEN STOP  30 tablet  0  . glyBURIDE-metformin (GLUCOVANCE) 5-500 MG per tablet Take 1 tablet by mouth 2 (two) times daily with a meal.        . lisinopril (PRINIVIL,ZESTRIL) 20 MG tablet Take 20 mg by mouth daily.        . methocarbamol (ROBAXIN) 500 MG tablet Take 500 mg by mouth 3 (three) times daily as needed.        . metoprolol tartrate (LOPRESSOR) 25 MG tablet Take 25 mg by mouth 2 (two) times daily.        Marland Kitchen omega-3 acid ethyl esters (LOVAZA) 1 G capsule Take 2 g by mouth 2 (two) times daily.        . polyethylene glycol powder (MIRALAX) powder Take 17 g by mouth at bedtime as needed.        . potassium chloride SA (KLOR-CON M20) 20 MEQ tablet TAKE FOR 1 TABLET DAILY FOR 1 WEEK THEN STOP  30 tablet  0  . pregabalin (LYRICA) 150 MG capsule Take 150 mg by mouth 2 (two) times daily.        Marland Kitchen pyridOXINE (VITAMIN B-6) 100 MG tablet Take 100 mg by mouth daily.        . ranitidine  (ZANTAC) 150 MG tablet Take 150 mg by mouth daily.        . rosuvastatin (CRESTOR) 40 MG tablet Take 40 mg by mouth daily.        . sertraline (ZOLOFT) 50 MG tablet Take 50 mg by mouth 2 (two) times daily.        . traMADol (ULTRAM) 50 MG tablet Take 50 mg by mouth every 6 (six) hours as needed.        . vitamin C (ASCORBIC ACID) 500 MG tablet Take 1,000 mg by mouth daily.        Marland Kitchen zolpidem (AMBIEN) 10 MG tablet Take 10 mg by mouth at bedtime as needed.           Review of Systems: Appetite is fair, he is sleeping fairly well. He is walking twice a day.  He is using his incentive spirometer. His blood sugars have been well controlled. Physical Exam The patient's vital signs are stable as noted. He is in a regular sinus rhythm. Breath sounds are slightly diminished at the left base. Cardiac rhythm is regular without rub or gallop. The sternum is well-healed. The bilateral leg incisions are healing well. There is minimal pedal edema.  Diagnostic Tests: I reviewed the previously performed chest x-ray which shows a left mild the oral effusion. No pulmonary edema noted.  Impression: Postoperative left pleural effusion. I'll add Zaroxolyn 5 mg a day x3 days to his oral Lasix. The patient returns for a followup visit with chest x-ray in 6 days. Plan: As noted.

## 2010-12-14 ENCOUNTER — Other Ambulatory Visit: Payer: Self-pay

## 2010-12-14 DIAGNOSIS — J90 Pleural effusion, not elsewhere classified: Secondary | ICD-10-CM

## 2010-12-19 ENCOUNTER — Other Ambulatory Visit (HOSPITAL_COMMUNITY): Payer: Self-pay | Admitting: Neurological Surgery

## 2010-12-19 ENCOUNTER — Ambulatory Visit
Admission: RE | Admit: 2010-12-19 | Discharge: 2010-12-19 | Disposition: A | Discharge status: Home / Self Care | Payer: 59 | Source: Ambulatory Visit | Attending: Cardiothoracic Surgery | Admitting: Cardiothoracic Surgery

## 2010-12-19 ENCOUNTER — Encounter: Payer: Self-pay | Admitting: Cardiothoracic Surgery

## 2010-12-19 ENCOUNTER — Other Ambulatory Visit: Payer: Self-pay | Admitting: Cardiothoracic Surgery

## 2010-12-19 ENCOUNTER — Other Ambulatory Visit (INDEPENDENT_AMBULATORY_CARE_PROVIDER_SITE_OTHER): Payer: 59 | Admitting: *Deleted

## 2010-12-19 ENCOUNTER — Ambulatory Visit (INDEPENDENT_AMBULATORY_CARE_PROVIDER_SITE_OTHER): Payer: Self-pay | Admitting: Cardiothoracic Surgery

## 2010-12-19 VITALS — BP 109/74 | HR 74 | Resp 18 | Ht 73.0 in | Wt 284.0 lb

## 2010-12-19 DIAGNOSIS — J90 Pleural effusion, not elsewhere classified: Secondary | ICD-10-CM

## 2010-12-19 DIAGNOSIS — Z951 Presence of aortocoronary bypass graft: Secondary | ICD-10-CM

## 2010-12-19 DIAGNOSIS — L02419 Cutaneous abscess of limb, unspecified: Secondary | ICD-10-CM

## 2010-12-19 DIAGNOSIS — R0602 Shortness of breath: Secondary | ICD-10-CM

## 2010-12-19 DIAGNOSIS — I2510 Atherosclerotic heart disease of native coronary artery without angina pectoris: Secondary | ICD-10-CM

## 2010-12-19 DIAGNOSIS — L03116 Cellulitis of left lower limb: Secondary | ICD-10-CM

## 2010-12-19 DIAGNOSIS — M48061 Spinal stenosis, lumbar region without neurogenic claudication: Secondary | ICD-10-CM

## 2010-12-19 LAB — BASIC METABOLIC PANEL
CO2: 24 mEq/L (ref 19–32)
Chloride: 98 mEq/L (ref 96–112)
Creatinine, Ser: 1.2 mg/dL (ref 0.4–1.5)
Potassium: 4.6 mEq/L (ref 3.5–5.1)
Sodium: 136 mEq/L (ref 135–145)

## 2010-12-19 LAB — BRAIN NATRIURETIC PEPTIDE: Pro B Natriuretic peptide (BNP): 57 pg/mL (ref 0.0–100.0)

## 2010-12-19 NOTE — Progress Notes (Signed)
[address]            [place] [address]          [phone]      HPI The patient returns for her second postop office visit after multivessel bypass grafting approximately one month ago. He continues to progress. He shortness of breath is improved and his exercise tolerance is better. He was placed on Lasix and Zaroxolyn for a mild left pleural effusion. He is able to take a deeper breath now. The chest incision is healing well. The left leg vein harvest incision has cellulitis and a eschar. He continues to lose weight gradually and maintained a sinus rhythm.     Current Outpatient Prescriptions  Medication Sig Dispense Refill  . ALPRAZolam (XANAX) 0.5 MG tablet Take 0.5 mg by mouth every 6 (six) hours as needed.        Marland Kitchen aspirin EC 81 MG tablet Take 81 mg by mouth daily.        . clopidogrel (PLAVIX) 75 MG tablet Take 75 mg by mouth daily.        Marland Kitchen docusate sodium (COLACE) 100 MG capsule Take 200 mg by mouth at bedtime.        Marland Kitchen glyBURIDE-metformin (GLUCOVANCE) 5-500 MG per tablet Take 1 tablet by mouth 2 (two) times daily with a meal.        . lisinopril (PRINIVIL,ZESTRIL) 20 MG tablet Take 20 mg by mouth daily.        . methocarbamol (ROBAXIN) 500 MG tablet Take 500 mg by mouth 3 (three) times daily as needed.        . metoprolol tartrate (LOPRESSOR) 25 MG tablet Take 25 mg by mouth 2 (two) times daily.        Marland Kitchen omega-3 acid ethyl esters (LOVAZA) 1 G capsule Take 2 g by mouth 2 (two) times daily.        . polyethylene glycol powder (MIRALAX) powder Take 17 g by mouth at bedtime as needed.        . pregabalin (LYRICA) 150 MG capsule Take 150 mg by mouth 2 (two) times daily.        Marland Kitchen pyridOXINE (VITAMIN B-6) 100 MG tablet Take 100 mg by mouth daily.        . ranitidine (ZANTAC) 150 MG tablet Take 150 mg by mouth daily.        . rosuvastatin (CRESTOR) 40 MG tablet Take 40 mg by mouth daily.        . sertraline (ZOLOFT) 50 MG tablet Take 50  mg by mouth 2 (two) times daily.        . traMADol (ULTRAM) 50 MG tablet Take 50 mg by mouth every 6 (six) hours as needed.        . vitamin C (ASCORBIC ACID) 500 MG tablet Take 1,000 mg by mouth daily.        Marland Kitchen zolpidem (AMBIEN) 10 MG tablet Take 10 mg by mouth at bedtime as needed.           Review of Systems: No fever or night sweats. No significant pain or narcotic requirement. Diabetes well-controlled.   Physical Exam Vital signs are stable. Pulse is 80 and regular. Breathing is comfortable on room air. Breath sounds are clear and equal. Cardiac rhythm is regular without murmur or rub. The right leg incision is well-healed. The left leg  incision has some cellulitis and eschar which is removed. The incision is clean with peroxide and saline and a Neosporin Band-Aid dressing applied.  Diagnostic Tests: Chest x-ray today shows improved left pleural effusion now minimal. Sternal wires intact.  Impression: Improved left pleural effusion. Moderate cellulitis of the left leg vein harvest site. Plan:  Local wound care by his wife which will consist of bleeding, saline cleansing, Neosporin Band-Aid dressings. Keflex 500 mg 3 times a day x1 week. Return for wound check of his leg in one week.

## 2010-12-19 NOTE — Patient Instructions (Signed)
OK to begin cardiac rehab,drive. Wash Left leg incision with saline and cover with neosporin daily.Take Keflex for one week.

## 2010-12-21 ENCOUNTER — Other Ambulatory Visit: Payer: Self-pay | Admitting: Cardiothoracic Surgery

## 2010-12-21 DIAGNOSIS — I2510 Atherosclerotic heart disease of native coronary artery without angina pectoris: Secondary | ICD-10-CM

## 2010-12-26 ENCOUNTER — Ambulatory Visit (INDEPENDENT_AMBULATORY_CARE_PROVIDER_SITE_OTHER): Payer: Self-pay | Admitting: Cardiothoracic Surgery

## 2010-12-26 ENCOUNTER — Encounter: Payer: Self-pay | Admitting: Cardiothoracic Surgery

## 2010-12-26 ENCOUNTER — Ambulatory Visit
Admission: RE | Admit: 2010-12-26 | Discharge: 2010-12-26 | Disposition: A | Discharge status: Home / Self Care | Payer: 59 | Source: Ambulatory Visit | Attending: Cardiothoracic Surgery | Admitting: Cardiothoracic Surgery

## 2010-12-26 VITALS — BP 111/77 | HR 82 | Resp 20 | Ht 73.0 in | Wt 284.0 lb

## 2010-12-26 DIAGNOSIS — L02419 Cutaneous abscess of limb, unspecified: Secondary | ICD-10-CM

## 2010-12-26 DIAGNOSIS — I2510 Atherosclerotic heart disease of native coronary artery without angina pectoris: Secondary | ICD-10-CM

## 2010-12-26 DIAGNOSIS — L03119 Cellulitis of unspecified part of limb: Secondary | ICD-10-CM

## 2010-12-26 NOTE — Patient Instructions (Signed)
Continue Keflex for non-healing L leg wound and topical home wound care.

## 2010-12-26 NOTE — Progress Notes (Signed)
  The patient returns for a followup after undergoing multivessel bypass grafting. He has had a small left pleural effusion which has responded well to oral diuretic therapy. He has a mild nonhealing left knee incision at the saphenous vein harvest site treated with oral Keflex and topical dressing changes. He is walking 3 times a day without recurrent angina or symptoms of CHF. He is well below his preoperative weight and oral Lasix and potassium therapy have been discontinued. Returns now with a chest x-ray for reexam.  EXAM vital signs are stable oxygen saturation greater than 95%. The sternum is well-healed. Breath sounds are clear and equal. Cardiac rhythm is regular.             The leg incision has mild cellulitis. It is sharply debrided of eschar Silver nitrate was applied and a Neosporin dressing is applied. There is no pedal edema.   PLAN  we will discontinue the Lasix and potassium. Another week of Keflex 500 by mouth 3 times a day is planned. He'll continue topical dressing changes. He'll continue his current activity level. The patient return for followup exam of his left leg on November 26 at the Monday p.m. clinic.

## 2010-12-27 ENCOUNTER — Other Ambulatory Visit: Payer: Self-pay

## 2010-12-27 MED ORDER — CLOPIDOGREL BISULFATE 75 MG PO TABS: 75.0000 mg | ORAL_TABLET | Freq: Every day | ORAL | Status: DC

## 2010-12-27 NOTE — Telephone Encounter (Signed)
..   Requested Prescriptions   Pending Prescriptions Disp Refills  . clopidogrel (PLAVIX) 75 MG tablet 30 tablet 6    Sig: Take 1 tablet (75 mg total) by mouth daily.   

## 2010-12-28 ENCOUNTER — Telehealth: Payer: Self-pay | Admitting: Cardiology

## 2010-12-28 MED ORDER — CLOPIDOGREL BISULFATE 75 MG PO TABS: 75.0000 mg | ORAL_TABLET | Freq: Every day | ORAL | Status: DC

## 2010-12-28 NOTE — Telephone Encounter (Signed)
LMOVM  Per Dr. Daleen Squibb pt to continue taking Plavix for 1 year. Prescription sent in to [facility] pharmacy Mylo Red RN

## 2010-12-28 NOTE — Telephone Encounter (Signed)
Pt calling wanting to make sure MD wanted pt to continue taking plavix. When pt wife called in RX's everything was filled but Plavix--therefore pt doesn't know if he needs to continue taking plavix. Please return pt call to discuss further.

## 2011-01-01 ENCOUNTER — Encounter: Payer: Self-pay | Admitting: *Deleted

## 2011-01-01 NOTE — Telephone Encounter (Signed)
Left message on pt's private voicemail to make sure he did receive the information for him to continue on Plavix and that Debbie did send a refill in to [facility].  Requested to call back if questions or further needs.

## 2011-01-14 ENCOUNTER — Ambulatory Visit (INDEPENDENT_AMBULATORY_CARE_PROVIDER_SITE_OTHER): Payer: Self-pay | Admitting: Physician Assistant

## 2011-01-14 VITALS — BP 150/87 | HR 82 | Resp 20 | Ht 73.0 in | Wt 281.0 lb

## 2011-01-14 DIAGNOSIS — L03119 Cellulitis of unspecified part of limb: Secondary | ICD-10-CM

## 2011-01-14 DIAGNOSIS — Z951 Presence of aortocoronary bypass graft: Secondary | ICD-10-CM

## 2011-01-14 DIAGNOSIS — L02419 Cutaneous abscess of limb, unspecified: Secondary | ICD-10-CM

## 2011-01-14 NOTE — Progress Notes (Signed)
HPI: Patient returns for routine postoperative follow-up having undergone CABG x 6 on 11/26/2010. The patient reports he was last seen by Dr. [name] on 12/19/2010 and he was given Keflex 500 mg tid for cellulitis of the left lower extremity . The patient has finished his antibiotic. He states that the redness on his left lower extremity is no longer present. He denies any fever, chills, shortness of breath or chest pain. The patient also told me that he stop taking Crestor as he had a lot of muscle aches. Previously he was on Lipitor prior  And he was unable to tolerate this for the same reason.  Current Outpatient Prescriptions  Medication Sig Dispense Refill  . ALPRAZolam (XANAX) 0.5 MG tablet Take 0.5 mg by mouth every 6 (six) hours as needed.        Marland Kitchen aspirin EC 81 MG tablet Take 81 mg by mouth daily.        . clopidogrel (PLAVIX) 75 MG tablet Take 1 tablet (75 mg total) by mouth daily.  90 tablet  3  . docusate sodium (COLACE) 100 MG capsule Take 200 mg by mouth at bedtime.        Marland Kitchen glyBURIDE-metformin (GLUCOVANCE) 5-500 MG per tablet Take 1 tablet by mouth 2 (two) times daily with a meal.        . lisinopril (PRINIVIL,ZESTRIL) 20 MG tablet Take 20 mg by mouth daily.        . methocarbamol (ROBAXIN) 500 MG tablet Take 500 mg by mouth 3 (three) times daily as needed.        . metoprolol tartrate (LOPRESSOR) 25 MG tablet Take 25 mg by mouth 2 (two) times daily.        Marland Kitchen omega-3 acid ethyl esters (LOVAZA) 1 G capsule Take 2 g by mouth 2 (two) times daily.        . polyethylene glycol powder (MIRALAX) powder Take 17 g by mouth at bedtime as needed.        . pregabalin (LYRICA) 150 MG capsule Take 150 mg by mouth 2 (two) times daily.        Marland Kitchen pyridOXINE (VITAMIN B-6) 100 MG tablet Take 100 mg by mouth daily.        . ranitidine (ZANTAC) 150 MG tablet Take 150 mg by mouth daily.        . sertraline (ZOLOFT) 50 MG tablet Take 50 mg by mouth 2 (two) times daily.        . traMADol (ULTRAM) 50  MG tablet Take 50 mg by mouth every 6 (six) hours as needed.        . vitamin C (ASCORBIC ACID) 500 MG tablet Take 1,000 mg by mouth daily.        Marland Kitchen zolpidem (AMBIEN) 10 MG tablet Take 10 mg by mouth at bedtime as needed.        Marland Kitchen          Physical Exam:  Cardiovascular: Regular rate and rhythm. No murmurs, gallops, or rubs. Pulmonary: Clear auscultation bilaterally. No rales, wheezes, rhonchi. Extremities: No lower extremity edema. Eschar present on left lower  EVH site. Each R. was removed without difficulty and there was slight bloody ooze from the distal portion of the                     wound. There was no cellulitis or drainage present.   Impression and Plan: I have instructed the patient to place a Band-Aid on  the left lower extremity wound and change this daily. He may let  this wound open to air once there is no more bleeding. The patient was also given 2 sticks of silver nitrate (in case the slight bleeding does not completely stop) as the patient is on Plavix. He should return to see Dr. Maren Beach on an  as needed basis. He was instructed if he develops any redness, drainage, fever or chills he is to contact her office immediately .The patient was instructed to contact Dr. Vern Claude office for a followup appointment and also inform him that he has stopped taking Crestor.

## 2011-01-16 ENCOUNTER — Encounter: Payer: Self-pay | Admitting: *Deleted

## 2011-01-18 ENCOUNTER — Encounter: Payer: Self-pay | Admitting: *Deleted

## 2011-01-21 ENCOUNTER — Other Ambulatory Visit (INDEPENDENT_AMBULATORY_CARE_PROVIDER_SITE_OTHER): Payer: 59 | Admitting: *Deleted

## 2011-01-21 ENCOUNTER — Encounter (HOSPITAL_COMMUNITY)
Admission: RE | Admit: 2011-01-21 | Discharge: 2011-01-21 | Disposition: A | Discharge status: Home / Self Care | Payer: 59 | Source: Ambulatory Visit | Attending: Cardiology | Admitting: Cardiology

## 2011-01-21 DIAGNOSIS — Z5189 Encounter for other specified aftercare: Secondary | ICD-10-CM | POA: Insufficient documentation

## 2011-01-21 DIAGNOSIS — R0602 Shortness of breath: Secondary | ICD-10-CM

## 2011-01-21 DIAGNOSIS — Z951 Presence of aortocoronary bypass graft: Secondary | ICD-10-CM | POA: Insufficient documentation

## 2011-01-21 DIAGNOSIS — E119 Type 2 diabetes mellitus without complications: Secondary | ICD-10-CM | POA: Insufficient documentation

## 2011-01-21 DIAGNOSIS — E785 Hyperlipidemia, unspecified: Secondary | ICD-10-CM

## 2011-01-21 DIAGNOSIS — I252 Old myocardial infarction: Secondary | ICD-10-CM | POA: Insufficient documentation

## 2011-01-21 DIAGNOSIS — I10 Essential (primary) hypertension: Secondary | ICD-10-CM | POA: Insufficient documentation

## 2011-01-21 DIAGNOSIS — M48061 Spinal stenosis, lumbar region without neurogenic claudication: Secondary | ICD-10-CM

## 2011-01-21 LAB — LIPID PANEL
Cholesterol: 156 mg/dL (ref 0–200)
HDL: 28.8 mg/dL — ABNORMAL LOW (ref >39.00)
LDL Cholesterol: 94 mg/dL (ref 0–99)
Triglycerides: 168 mg/dL — ABNORMAL HIGH (ref 0.0–149.0)
VLDL: 33.6 mg/dL (ref 0.0–40.0)

## 2011-01-21 LAB — HEPATIC FUNCTION PANEL
ALT: 20 U/L (ref 0–53)
Albumin: 3.8 g/dL (ref 3.5–5.2)
Bilirubin, Direct: 0.1 mg/dL (ref 0.0–0.3)
Total Protein: 8.3 g/dL (ref 6.0–8.3)

## 2011-01-21 LAB — GLUCOSE, CAPILLARY: Glucose-Capillary: 122 mg/dL — ABNORMAL HIGH (ref 70–99)

## 2011-01-21 NOTE — Progress Notes (Signed)
Pt started cardiac rehab today.  Pt tolerated light exercise without difficulty. Telemetry rhythm Sinus without ectopy. Spencer [name] did little walking on the track today due to chronic back pain.  Blood sugars monitored.Will continue to monitor the patient throughout  the program. Blood pressure max 158/90 on the airdyne recheck blood pressure 118/68.

## 2011-01-21 NOTE — Progress Notes (Signed)
Spencer [name] 55 y.o. male       Nutrition Screen                                                                    YES  NO Do you live in a nursing home?  X   Do you eat out more than 3 times/week?   X  If yes, how many times per week do you eat out? 4-5  Do you have food allergies?  X  If yes, what are you allergic to? Mayonnaise  Have you gained or lost more than 10 lbs without trying?               X If yes, how much weight have you lost and over what time period?  lbs gained or lost over  weeks/month  Do you want to lose weight?    X  If yes, what is a goal weight or amount of weight you would like to lose? 75 lb  Do you eat alone most of the time?   X   Do you eat less than 2 meals/day?  X If yes, how many meals do you eat?  Do you drink more than 3 alcohol drinks/day?  X If yes, how many drinks per day?  Are you having trouble with constipation? * X  If yes, what are you doing to help relieve constipation? Miralax  Do you have financial difficulties with buying food?*    X   Are you experiencing regular nausea/ vomiting?*     X   Do you have a poor appetite? *                                        X   Do you have trouble chewing/swallowing? *   X    Pt with diagnoses of:  X CABG              X MI                          X GERD          X IBS                   X DM/A1c >6 / CBG >126 X Dyslipidemia  / HDL< 40 / LDL>70 / High TG      X %  Body fat >goal / Body Mass Index >25 X HTN / BP >120/80       Pt Risk Score    8       Diagnosis Risk Score   85       Total Risk Score   93                          X High Risk                Low Risk              HT: 73" Ht Readings from Last 1 Encounters:  01/14/11 6\' 1"  (1.854 m)  WT:   283.1 lb (128.7 kg) Wt Readings from Last 3 Encounters:  01/14/11 281 lb (127.461 kg)  12/26/10 284 lb (128.822 kg)  12/19/10 284 lb (128.822 kg)     IBW 83.6 154%IBW BMI 37.4 29.3%body fat  Meds:  Past Medical History  Diagnosis Date    . Morbidly obese   . Coronary artery disease     NSTEMI 10/12: LHC 11/24/10: mRCA 99% (diffuse), pLAD and Dx bifurcational lesion 60-70%, LAD 70-80% after Dx, dCFX 70%, OM1 70%, EF 40-45%.  Echo 11/24/10: EF normal, mild LVH;  s/p CABG with L-LAD, S-OM1/OM2, S-RV marg/RCA.  Marland Kitchen DM2 (diabetes mellitus, type 2)     TYPE II  . Hypertension   . Hyperlipidemia   . Anxiety   . GERD (gastroesophageal reflux disease)   . Back pain, chronic     s/p lumbar fusion  . IBS (irritable bowel syndrome)         Activity level: Pt is active  Wt goal: 259-271 lb ( 117.7-123.2 kg) Current tobacco use? No Food/Drug Interaction? No Labs:  Lipid Panel     Component Value Date/Time   CHOL 201* 11/23/2010 0314   TRIG 328* 11/23/2010 0314   HDL 27* 11/23/2010 0314   CHOLHDL 7.4 11/23/2010 0314   VLDL 66* 11/23/2010 0314   LDLCALC 108* 11/23/2010 0314     Lab Results  Component Value Date   HGBA1C 7.1* 11/25/2010    11/29/10 Glucose 132   LDL goal: < 70      MI, DM, Carotid or PVD and > 2:      tobacco, HTN, HDL, family h/o, lipoprotein a     > 53 yo male or        >53 yo male   Estimated Daily Nutrition Needs for: ? wt loss 2050-2550 Kcal , Total Fat 55-70gm, Saturated Fat 15-20 gm, Trans Fat 2.0-2.6 gm,  Sodium less than 1500 mg, Grams of CHO 250-325  Note: Per MD note, pt stopped taking Crestor due to muscle aches.  Lipids to be drawn today.

## 2011-01-22 ENCOUNTER — Other Ambulatory Visit: Payer: Self-pay | Admitting: *Deleted

## 2011-01-22 MED ORDER — LISINOPRIL 20 MG PO TABS: 20.0000 mg | ORAL_TABLET | Freq: Every day | ORAL | Status: DC

## 2011-01-22 MED ORDER — METOPROLOL TARTRATE 25 MG PO TABS: 25.0000 mg | ORAL_TABLET | Freq: Two times a day (BID) | ORAL | Status: DC

## 2011-01-23 ENCOUNTER — Encounter (HOSPITAL_COMMUNITY)
Admission: RE | Admit: 2011-01-23 | Discharge: 2011-01-23 | Disposition: A | Discharge status: Home / Self Care | Payer: 59 | Source: Ambulatory Visit | Attending: Cardiology | Admitting: Cardiology

## 2011-01-25 ENCOUNTER — Telehealth: Payer: Self-pay | Admitting: *Deleted

## 2011-01-25 ENCOUNTER — Encounter (HOSPITAL_COMMUNITY)
Admission: RE | Admit: 2011-01-25 | Discharge: 2011-01-25 | Disposition: A | Discharge status: Home / Self Care | Payer: 59 | Source: Ambulatory Visit | Attending: Cardiology | Admitting: Cardiology

## 2011-01-25 LAB — GLUCOSE, CAPILLARY: Glucose-Capillary: 86 mg/dL (ref 70–99)

## 2011-01-25 NOTE — Telephone Encounter (Signed)
Change Crestor to take on Monday, Wednesday and Friday only to see if he can tolerate Tereso Newcomer, PA-C  9:12 PM 01/25/2011

## 2011-01-25 NOTE — Telephone Encounter (Signed)
Pt aware of cholesterol results.  He has continued to change his diet and reduce weight.  He will be starting back with pool exercises in January. Mr. Blew also reports he stopped his Crestor 2-3 weeks ago because of increasing muscle fatigue and achiness similar to arthritis pain. He was taking this at bedtime. Since that time all symptoms resolved since discontinuing the Crestor. Pt has appt with Lorin Picket on 01/28/11  Mylo Red RN

## 2011-01-25 NOTE — Telephone Encounter (Signed)
Message copied by Theda Belfast on Fri Jan 25, 2011  4:50 PM ------      Message from: [name] C      Created: Tue Jan 22, 2011  1:30 PM       Numbers are not at goal Flomax and Crestor. Am reluctant to use Niaspan with his diabetes. Diet and weight control

## 2011-01-28 ENCOUNTER — Encounter (HOSPITAL_COMMUNITY)
Admission: RE | Admit: 2011-01-28 | Discharge: 2011-01-28 | Disposition: A | Discharge status: Home / Self Care | Payer: 59 | Source: Ambulatory Visit | Attending: Cardiology | Admitting: Cardiology

## 2011-01-28 ENCOUNTER — Encounter: Payer: Self-pay | Admitting: Physician Assistant

## 2011-01-28 ENCOUNTER — Ambulatory Visit (INDEPENDENT_AMBULATORY_CARE_PROVIDER_SITE_OTHER): Payer: 59 | Admitting: Physician Assistant

## 2011-01-28 VITALS — BP 122/78 | HR 72 | Ht 73.0 in | Wt 280.8 lb

## 2011-01-28 DIAGNOSIS — I2510 Atherosclerotic heart disease of native coronary artery without angina pectoris: Secondary | ICD-10-CM

## 2011-01-28 LAB — GLUCOSE, CAPILLARY: Glucose-Capillary: 97 mg/dL (ref 70–99)

## 2011-01-28 NOTE — Progress Notes (Signed)
[address] [place], [place]  [address] Phone: [phone] Fax:  [phone]  Date:  01/28/2011   Name:  Spencer [name]       DOB:  1955-05-15 MRN:  [id number]  PCP:  Dr. Jacky Kindle  Primary Cardiologist:  Dr. [name]    History of Present Illness: Spencer [name] is a 55 y.o. male who presents for follow up. He was admitted 10/12 for NSTEMI.  LHC 11/24/10: mRCA 99% (diffuse), pLAD and Dx bifurcational lesion 60-70%, LAD 70-80% after Dx, dCFX 70%, OM1 70%, EF 40-45%.  Echo 11/24/10: EF normal, mild LVH. He underwent CABG with Dr. [name] (grafts: L-LAD, S-OM1/OM2, S-RV marg/RCA).   I saw him in follow up 10/23.  He noted increased dyspnea.  I was concerned that he was still volume overloaded with persistent left pleural effusion.  Chest x-ray 10/23 demonstrated enlarging left effusion and left lower lobe atelectasis.  He was seen back by Dr. [name].  I had restarted his Lasix and Dr. [name] placed him on metolazone x 3 days.  He was followed closely over the next several weeks with resolution of his effusion.  He was also treated with Keflex for cellulitis at his vein harvesting site.  This resolved.    Labs (10/12):  Hgb 9;  K 5.1 ==> 4.6; creatinine 0.9 ==> 1.2  He is doing well.  Has been in cardiac rehab 2 weeks now and feels well.  The patient denies significant shortness of breath, syncope, orthopnea, PND or significant pedal edema.  Chest is sore but improving.  He had a problem with Crestor causing myalgias and had to stop about 3 weeks ago.    Past Medical History  Diagnosis Date  . Morbidly obese   . Coronary artery disease     NSTEMI 10/12: LHC 11/24/10: mRCA 99% (diffuse), pLAD and Dx bifurcational lesion 60-70%, LAD 70-80% after Dx, dCFX 70%, OM1 70%, EF 40-45%.  Echo 11/24/10: EF normal, mild LVH;  s/p CABG with L-LAD, S-OM1/OM2, S-RV marg/RCA.  Marland Kitchen DM2 (diabetes mellitus, type 2)     TYPE II  . Hypertension   . Hyperlipidemia   . Anxiety     . GERD (gastroesophageal reflux disease)   . Back pain, chronic     s/p lumbar fusion  . IBS (irritable bowel syndrome)   . Pleural effusion, left 11/2010    Postop; resolved with p.o. diuresis    Current Outpatient Prescriptions  Medication Sig Dispense Refill  . ALPRAZolam (XANAX) 0.5 MG tablet Take 0.5 mg by mouth every 6 (six) hours as needed.        Marland Kitchen aspirin EC 81 MG tablet Take 81 mg by mouth daily.        . clopidogrel (PLAVIX) 75 MG tablet Take 1 tablet (75 mg total) by mouth daily.  90 tablet  3  . docusate sodium (COLACE) 100 MG capsule Take 200 mg by mouth at bedtime.        Marland Kitchen glyBURIDE-metformin (GLUCOVANCE) 5-500 MG per tablet Take 1 tablet by mouth 2 (two) times daily with a meal. Taking a half a tablet twice a day currently      . lisinopril (PRINIVIL,ZESTRIL) 20 MG tablet Take 1 tablet (20 mg total) by mouth daily.  90 tablet  3  . methocarbamol (ROBAXIN) 500 MG tablet Take 500 mg by mouth 3 (three) times daily as needed.        . metoprolol tartrate (LOPRESSOR) 25 MG  tablet Take 1 tablet (25 mg total) by mouth 2 (two) times daily.  180 tablet  3  . omega-3 acid ethyl esters (LOVAZA) 1 G capsule Take 2 g by mouth 2 (two) times daily.        . polyethylene glycol powder (MIRALAX) powder Take 17 g by mouth at bedtime as needed.        . pregabalin (LYRICA) 150 MG capsule Take 150 mg by mouth 2 (two) times daily.        Marland Kitchen pyridOXINE (VITAMIN B-6) 100 MG tablet Take 100 mg by mouth daily.        . ranitidine (ZANTAC) 150 MG tablet Take 150 mg by mouth daily.        . rosuvastatin (CRESTOR) 40 MG tablet Take 40 mg by mouth daily.        . sertraline (ZOLOFT) 50 MG tablet Take 50 mg by mouth 2 (two) times daily.        . traMADol (ULTRAM) 50 MG tablet Take 50 mg by mouth every 6 (six) hours as needed.        . vitamin C (ASCORBIC ACID) 500 MG tablet Take 1,000 mg by mouth daily.        Marland Kitchen zolpidem (AMBIEN) 10 MG tablet Take 10 mg by mouth at bedtime as needed.           Allergies: Allergies  Allergen Reactions  . Codeine     REACTION: HYPER    History  Substance Use Topics  . Smoking status: Never Smoker   . Smokeless tobacco: Not on file  . Alcohol Use: No     PHYSICAL EXAM: VS:  BP 122/78  Pulse 72  Ht 6\' 1"  (1.854 m)  Wt 280 lb 12.8 oz (127.37 kg)  BMI 37.05 kg/m2 Well nourished, well developed, in no acute distress HEENT: normal Neck: no JVD Cardiac:  normal S1, S2; RRR; no murmur Lungs:  Decreased breath sounds bilaterally, no wheezing, rhonchi or rales Abd: soft, nontender, no hepatomegaly Ext: no edema Skin: warm and dry; Median sternotomy and left leg vein harvesting sites without erythema or discharge Neuro:  CNs 2-12 intact, no focal abnormalities noted  EKG:   Sinus rhythm, heart rate 72, normal axis, poor R-wave progression, no ischemic changes  ASSESSMENT AND PLAN:

## 2011-01-28 NOTE — Assessment & Plan Note (Signed)
Controlled.  Continue current therapy.  

## 2011-01-28 NOTE — Progress Notes (Signed)
Reviewed home exercise with pt today.  Pt plans to walk and do water aerobics/water treading for exercise.  Reviewed THR, pulse, RPE, sign and symptoms, and when to call 911 or MD.  Pt voiced understanding.

## 2011-01-28 NOTE — Assessment & Plan Note (Signed)
Doing well.  He is now down 18 pounds.  His left effusion has resolved.  He is doing well with cardiac rehabilitation.  Continue aspirin and Plavix.  Followup with Dr. Daleen Squibb in 3-4 months.

## 2011-01-28 NOTE — Assessment & Plan Note (Signed)
He has been unable to tolerate Crestor 40 mg.  I will give him samples of Crestor 20 mg and have him take on Monday, Wednesday and Friday only.  If he can tolerate this, check lipids and LFTs in 6 weeks.  If he cannot tolerate, he will contact us.

## 2011-01-28 NOTE — Patient Instructions (Addendum)
Your physician recommends that you schedule a follow-up appointment in: DR. WALL 3-4 MONTHS PER SCOTT WEAVER, PA-C  YOU HAVE BEEN GIVEN SAMPLES OF CRESTOR 20 MG; TAKE ON M, W, FRI; PLEASE LET us KNOW IF YOU CAN TOLERATE CRESTOR WITH THESE INSTRUCTIONS  Your physician recommends that you return for lab work in: 6 WEEKS IF YOU CAN TOLERATE THE CRESTOR TAKEN ONLY ON M, W, FRI'S

## 2011-01-30 ENCOUNTER — Encounter (HOSPITAL_COMMUNITY)
Admission: RE | Admit: 2011-01-30 | Discharge: 2011-01-30 | Disposition: A | Discharge status: Home / Self Care | Payer: 59 | Source: Ambulatory Visit | Attending: Cardiology | Admitting: Cardiology

## 2011-01-30 LAB — GLUCOSE, CAPILLARY
Glucose-Capillary: 125 mg/dL — ABNORMAL HIGH (ref 70–99)
Glucose-Capillary: 83 mg/dL (ref 70–99)

## 2011-02-01 ENCOUNTER — Encounter (HOSPITAL_COMMUNITY)
Admission: RE | Admit: 2011-02-01 | Discharge: 2011-02-01 | Disposition: A | Discharge status: Home / Self Care | Payer: 59 | Source: Ambulatory Visit | Attending: Cardiology | Admitting: Cardiology

## 2011-02-01 LAB — GLUCOSE, CAPILLARY: Glucose-Capillary: 118 mg/dL — ABNORMAL HIGH (ref 70–99)

## 2011-02-01 NOTE — Progress Notes (Signed)
Spencer [name] 55 y.o. male Nutrition Note  Spoke with pt.  Nutrition Plan and Nutrition Survey reviewed with pt.  This Clinical research associate went over Diabetes Education test results. Weight loss tips discussed. Pt states he has tried Physician's Wt loss program previously and was successful losing wt and gained the wt back. Pt currently trying to make a lifestyle change with his eating habits.    Nutrition Diagnosis   Food-and nutrition-related knowledge deficit related to lack of exposure to information as related to diagnosis of: ? CVD ? DM (A1c 7.1)   Obesity related to excessive energy intake as evidenced by a BMI of 37.4  Nutrition RX/ Estimated Daily Nutrition Needs for: wt loss 2050-2550 Kcal, 55-70 gm fat, 15-20 gm sat fat, 2.0-2.6 gm trans-fat, <1500 mg sodium, 250 gm CHO   Nutrition Intervention   Pt's individual nutrition plan including cholesterol goals reviewed with pt.   Benefits of adopting Therapeutic Lifestyle Changes discussed when Medficts reviewed.   Pt to attend the Portion Distortion class   Pt to attend the ? Nutrition I class - met 01/22/11                       ? Nutrition II class         ? Diabetes Blitz class     ? Diabetes Q & A class   Pt given handouts for: ? wt loss ? DM    Continue client-centered nutrition education by RD, as part of interdisciplinary care. Goal(s)   Pt to identify food quantities necessary to achieve: ? wt loss to a goal wt of 259-271 lb (117.7-123.2 kg) at graduation from cardiac rehab.    Pt to describe the benefit of including fruits, vegetables, whole grains, and low-fat dairy products in a heart healthy meal plan.   Use pre-meal and post-meal CBG's and A1c to determine whether adjustments in food/meal planning will be beneficial or if any meds need to be combined with nutrition therapy. Monitor and Evaluate progress toward nutrition goal with team.

## 2011-02-04 ENCOUNTER — Encounter (HOSPITAL_COMMUNITY)
Admission: RE | Admit: 2011-02-04 | Discharge: 2011-02-04 | Disposition: A | Discharge status: Home / Self Care | Payer: 59 | Source: Ambulatory Visit | Attending: Cardiology | Admitting: Cardiology

## 2011-02-04 LAB — GLUCOSE, CAPILLARY: Glucose-Capillary: 93 mg/dL (ref 70–99)

## 2011-02-04 NOTE — Progress Notes (Signed)
Time Spent: 15 minutes Spoke with pt.  Pt ate breakfast early this am (grits, cheese, fruit).  Pt pre-exercise CBG 76 mg/dL.  Pt given peanut butter and crackers for snack and CBG increased to 114 mg/dL.  Post-exercise CBG 53 mg/dL.  Pt CBG's decreasing 21-61 points during exercise despite pre-exercise snack.  Per discussion with Spencer Lighter, RN, pt stated that Spencer [name] told pt to hold his am Glucovance to help with better CBG's before and after exercise.  Pt has not held his Glucovance at this point and stated "I'd rather not hold my DM medication."  Pt reports he will eat more prior to exercise.  This Clinical research associate encouraged pt to follow his MD recommendations.  Pt reports he has his annual appointment with Spencer [name] in January and he will discuss any medication change with him at that time.  Continue client-centered nutrition education by RD as part of interdisciplinary care.  Monitor and evaluate progress toward nutrition goal with team.

## 2011-02-04 NOTE — Progress Notes (Signed)
Pre exercise CBG 76.  Patient given an apple, peanut butter and graham crackers and lemonade.  Recheck CBG 114.  Patient exercised without complaint's.  Post exercise CBG 53  Patient asymptomatic.  Patient given glucose gel and ginger ale repeat CBG 93 after 15 minutes.  Dr. Lanell Matar office called and notified of low CBG.  Faxed exercise flow sheet to Dr Lanell Matar office for review.

## 2011-02-05 MED FILL — Glucose Gel 40%: ORAL | Qty: 0.83 | Status: AC

## 2011-02-06 ENCOUNTER — Encounter (HOSPITAL_COMMUNITY)
Admission: RE | Admit: 2011-02-06 | Discharge: 2011-02-06 | Disposition: A | Discharge status: Home / Self Care | Payer: 59 | Source: Ambulatory Visit | Attending: Cardiology | Admitting: Cardiology

## 2011-02-06 LAB — GLUCOSE, CAPILLARY: Glucose-Capillary: 108 mg/dL — ABNORMAL HIGH (ref 70–99)

## 2011-02-08 ENCOUNTER — Ambulatory Visit (HOSPITAL_COMMUNITY)
Admission: RE | Admit: 2011-02-08 | Discharge: 2011-02-08 | Disposition: A | Discharge status: Home / Self Care | Payer: 59 | Source: Ambulatory Visit | Attending: Neurological Surgery | Admitting: Neurological Surgery

## 2011-02-08 ENCOUNTER — Other Ambulatory Visit (HOSPITAL_COMMUNITY): Payer: Self-pay | Admitting: Neurological Surgery

## 2011-02-08 ENCOUNTER — Encounter (HOSPITAL_COMMUNITY)
Admission: RE | Admit: 2011-02-08 | Discharge: 2011-02-08 | Disposition: A | Discharge status: Home / Self Care | Payer: 59 | Source: Ambulatory Visit | Attending: Cardiology | Admitting: Cardiology

## 2011-02-08 DIAGNOSIS — Z981 Arthrodesis status: Secondary | ICD-10-CM | POA: Insufficient documentation

## 2011-02-08 DIAGNOSIS — M545 Low back pain, unspecified: Secondary | ICD-10-CM | POA: Insufficient documentation

## 2011-02-08 DIAGNOSIS — M48061 Spinal stenosis, lumbar region without neurogenic claudication: Secondary | ICD-10-CM

## 2011-02-08 LAB — GLUCOSE, CAPILLARY
Glucose-Capillary: 191 mg/dL — ABNORMAL HIGH (ref 70–99)
Glucose-Capillary: 126 mg/dL — ABNORMAL HIGH (ref 70–99)

## 2011-02-08 NOTE — Progress Notes (Signed)
Hilton graduates today from cardiac rehab today.  He plans to continue water aerobics starting next year.

## 2011-02-18 ENCOUNTER — Encounter: Payer: Self-pay | Admitting: *Deleted

## 2011-02-21 NOTE — Progress Notes (Addendum)
Cardiac Rehabilitation Program Progress Report   Orientation:  01/07/2011 Graduate Date:  02/08/2011 Discharge Date:   # of sessions completed: 9/9 due to copay  Cardiologist: Daleen Squibb Family MD:  Kathleen Lime Time:  945  A.  Exercise Program:  Tolerates exercise @ 3.1 METS for 30 minutes, Bike Test Results:  Pre: 0.99 miles and Discharged to home exercise program.  Anticipated compliance:  excellent  B.  Mental Health:  Good mental attitude  C.  Education/Instruction/Skills  Accurately checks own pulse.  Rest:  78  Exercise:  134, Knows THR for exercise, Uses Perceived Exertion Scale and/or Dyspnea Scale and Attended 2/15 education classes  Home exercise given: 01/28/2011  D.  Nutrition/Weight Control/Body Composition:  Adherence to prescribed nutrition program: good , Patient has lost 2.5 kg, CBG self-monitoring and Glycemic Control:  Fair (A1c 7.1)  *This section completed by Mickle Plumb, Andres Shad, RD, LDN, CDE  E.  Blood Lipids Lab Results  Component Value Date   CHOL 156 01/21/2011   HDL 28.80* 01/21/2011   LDLCALC 94 01/21/2011   TRIG 168.0* 01/21/2011   CHOLHDL 5 01/21/2011      F.  Lifestyle Changes:  Making positive lifestyle changes  G.  Symptoms noted with exercise:  Hypoglycemic on 12/18/ 2012  Report Completed By:  Hazle Nordmann   Comments:  Pt did very well in program and greatly improved his mobility.  Pt plans to continue to exercise on own by going back to his water aerobics and treading.  At discharge pt rhythm was sinus/sinus tach.  Thanks for the referral. Fabio Pierce, MA, ACSM RCEP

## 2011-02-26 ENCOUNTER — Telehealth: Payer: Self-pay | Admitting: Cardiology

## 2011-02-26 DIAGNOSIS — E785 Hyperlipidemia, unspecified: Secondary | ICD-10-CM

## 2011-02-26 NOTE — Telephone Encounter (Signed)
Refer him to the Lipid clinic to see if they can find a medication he can tolerate. Tereso Newcomer, PA-C  4:37 PM 02/26/2011

## 2011-02-26 NOTE — Telephone Encounter (Signed)
New Msg: Pt calling wanting to know if he can stop taking blood thinner (plavix) for five days prior to pt getting cortisone shot in pt back for. Today is suppose to the first day that pt WOULD NOT take plavix in preparation for pt cortisone shot IF he is permitted to stop taking blood thinner for one week.  Please return pt call ( today if possible) to discuss further.

## 2011-02-26 NOTE — Telephone Encounter (Signed)
02/26/11--Spencer [name]--when speaking to Tejay Gong, he wanted me to let you know he cannot tolerate the crestor you gave him so he has d/ced it--nt

## 2011-02-26 NOTE — Telephone Encounter (Signed)
02/26/11--1545p--pt calling asking if he can come off plavix for 5 days for steroid injection in back--spoke with scott weaver and dr cooper about question and they both stated OK for pt to d/c plavix x5days--will stay on ASA--pt notified and agrees--nt

## 2011-02-27 NOTE — Telephone Encounter (Signed)
Spoke with patient.  He is agreeable to come to the Lipid clinic.  He has been off Crestor for approximately 2 weeks.  He was on medication when last lipid panel was drawn.  Will redraw panel in ~2 weeks (1 month off medication) prior to OV.  Appts made for lab and clinic.

## 2011-03-07 ENCOUNTER — Other Ambulatory Visit: Payer: Self-pay | Admitting: *Deleted

## 2011-03-07 MED ORDER — LISINOPRIL 20 MG PO TABS: 20.0000 mg | ORAL_TABLET | Freq: Every day | ORAL | Status: DC

## 2011-03-07 MED ORDER — METOPROLOL TARTRATE 25 MG PO TABS: 25.0000 mg | ORAL_TABLET | Freq: Two times a day (BID) | ORAL | Status: DC

## 2011-03-07 MED ORDER — ROSUVASTATIN CALCIUM 40 MG PO TABS: 20.0000 mg | ORAL_TABLET | Freq: Every day | ORAL | Status: DC

## 2011-03-13 ENCOUNTER — Encounter (HOSPITAL_COMMUNITY): Payer: Self-pay | Admitting: *Deleted

## 2011-03-13 NOTE — Progress Notes (Addendum)
Agree with progress summary on 02/21/11 for cardiac rehab.  Elam did have some limitations with exercise due to his chronic back pain but overall did well.  Thanks for the referral of your nice patient.

## 2011-03-18 ENCOUNTER — Other Ambulatory Visit (INDEPENDENT_AMBULATORY_CARE_PROVIDER_SITE_OTHER): Payer: 59 | Admitting: *Deleted

## 2011-03-18 DIAGNOSIS — E785 Hyperlipidemia, unspecified: Secondary | ICD-10-CM

## 2011-03-18 LAB — LIPID PANEL
Cholesterol: 184 mg/dL (ref 0–200)
HDL: 30.7 mg/dL — ABNORMAL LOW (ref >39.00)
LDL Cholesterol: 114 mg/dL — ABNORMAL HIGH (ref 0–99)
Triglycerides: 197 mg/dL — ABNORMAL HIGH (ref 0.0–149.0)
VLDL: 39.4 mg/dL (ref 0.0–40.0)

## 2011-03-18 LAB — HEPATIC FUNCTION PANEL: Total Bilirubin: 0.5 mg/dL (ref 0.3–1.2)

## 2011-03-19 ENCOUNTER — Other Ambulatory Visit: Payer: Self-pay | Admitting: Physician Assistant

## 2011-03-21 ENCOUNTER — Ambulatory Visit (INDEPENDENT_AMBULATORY_CARE_PROVIDER_SITE_OTHER): Payer: 59 | Admitting: Pharmacist

## 2011-03-21 VITALS — Wt 284.8 lb

## 2011-03-21 DIAGNOSIS — E785 Hyperlipidemia, unspecified: Secondary | ICD-10-CM

## 2011-03-21 MED ORDER — PRAVASTATIN SODIUM 40 MG PO TABS: 40.0000 mg | ORAL_TABLET | Freq: Every evening | ORAL | Status: DC

## 2011-03-21 NOTE — Patient Instructions (Signed)
Continue Lovaza as you are taking it now.  Start taking Pravachol (pravastatin) 40 mg daily at bedtime.  Call the lipid clinic [phone]) if you start experiencing muscle pains.    Continue your pool exercises daily at the [facility].  Make a goal to work up to 2 hours 5 days per week.  Continue making healthy dietary choices.  Try decreasing your portion size and eat small healthy snacks between meals.  We will call you to make an appointment for 2-3 months from now to recheck your cholesterol.

## 2011-03-21 NOTE — Progress Notes (Signed)
HPI:  Spencer [name] is a 44 yoWM presenting to the lipid clinic to discuss his dyslipidemia.  He is currently treated with Lovaza 1 g PO BID.  In the past, he has tried Zocor, Lipitor, Tricor, Lopid, Trilipix and Crestor.  He experienced muscle pain/weakness as well as a "torn up stomach".  Participated in cardiac rehab following MI.  Treads water for 1 hour and does pool exercises at the [facility] daily Monday through Friday.  Cannot walk on a hard surface due to back.    For breakfast, eats cereal (with <10g sugar per serving - special K) with skim milk and a banana.  For lunch, eats a sandwich (wheat bread, Terrill Mohr chicken, apple).  Stays away from chips.  Snacks on sugar free jello with added fruit.  Eats out for dinner sometimes and gets a meat (chicken, some beef) and vegetables (pintos, cabbage, broccoli with cheese sauce, collards), grilled chicken salad (french dressing - stays away from creamy dressings/mayo).  If eat bread with dinner, get the wheat rolls. Patient states that he has made an effort to eat healthier but still needs to improve his portion sizes as he still has very large portions. Portion size is his weakness at the moment.  Current Outpatient Prescriptions on File Prior to Visit  Medication Sig Dispense Refill  . ALPRAZolam (XANAX) 0.5 MG tablet Take 0.5 mg by mouth every 6 (six) hours as needed.        Marland Kitchen aspirin EC 81 MG tablet Take 81 mg by mouth daily.        . clopidogrel (PLAVIX) 75 MG tablet Take 1 tablet (75 mg total) by mouth daily.  90 tablet  3  . docusate sodium (COLACE) 100 MG capsule Take 200 mg by mouth at bedtime.        Marland Kitchen glyBURIDE-metformin (GLUCOVANCE) 5-500 MG per tablet Take 1 tablet by mouth daily with breakfast. Taking a half a tablet once a day currently      . lisinopril (PRINIVIL,ZESTRIL) 20 MG tablet Take 1 tablet (20 mg total) by mouth daily.  90 tablet  3  . methocarbamol (ROBAXIN) 500 MG tablet Take 500 mg by mouth 3 (three) times daily as needed.        .  metoprolol tartrate (LOPRESSOR) 25 MG tablet Take 1 tablet (25 mg total) by mouth 2 (two) times daily.  180 tablet  3  . omega-3 acid ethyl esters (LOVAZA) 1 G capsule Take 2 g by mouth 2 (two) times daily.        . polyethylene glycol powder (MIRALAX) powder Take 17 g by mouth at bedtime as needed.        . pregabalin (LYRICA) 150 MG capsule Take 150 mg by mouth 2 (two) times daily.        Marland Kitchen pyridOXINE (VITAMIN B-6) 100 MG tablet Take 100 mg by mouth daily.        . ranitidine (ZANTAC) 150 MG tablet Take 150 mg by mouth daily.        . sertraline (ZOLOFT) 50 MG tablet Take 50 mg by mouth 2 (two) times daily.        . traMADol (ULTRAM) 50 MG tablet Take 50 mg by mouth every 6 (six) hours as needed.        . vitamin C (ASCORBIC ACID) 500 MG tablet Take 1,000 mg by mouth daily.        Marland Kitchen zolpidem (AMBIEN) 10 MG tablet Take 10 mg by mouth at bedtime as  needed.         Allergies  Allergen Reactions  . Codeine     REACTION: HYPER

## 2011-03-21 NOTE — Assessment & Plan Note (Signed)
DD is a 56 yo M who has been intolerant to statins in the past. Current labs are: TC: 184 (goal <200, TG: 197 (goal <150), HDL 30 (goal > 40), LDL 114 (goal <100). Pt has been off of Crestor 20 mg for the past month due to intolerance including fatigue/muscle pain. Despite being off of Crestor and only on Lovaza the past month his cholesterol panel has remained fairly stable with only a slight increase in LDL and TG. Will recommend trying Pravachol to help lower the LDL as well as hoping the side effects will be less with Pravachol. Discussed with pt that if Pravachol results in the same fatigue and muscle pain to call and we will stop the Pravachol and most likely just continue to Lovaza as patient has tried many other options including statins and fibrates. If this is the case the next option is to increase the Lovaza.  Plan:  Continue Lovaza as you are taking it now.  Start taking Pravachol (pravastatin) 40 mg daily at bedtime.    Continue pool exercises daily at the [facility].  Make a goal to work up to 2 hours 5 days per week.  Continue making healthy dietary choices.  Try decreasing your portion size and eat small healthy snacks between meals.  F/U appointment for 2-3 months from now to recheck  Cholesterol and tolerance to Pravachol.

## 2011-05-18 ENCOUNTER — Other Ambulatory Visit: Payer: Self-pay | Admitting: Physician Assistant

## 2011-05-22 ENCOUNTER — Ambulatory Visit (INDEPENDENT_AMBULATORY_CARE_PROVIDER_SITE_OTHER): Payer: 59 | Admitting: Cardiology

## 2011-05-22 ENCOUNTER — Encounter: Payer: Self-pay | Admitting: Cardiology

## 2011-05-22 VITALS — BP 144/70 | HR 68 | Ht 73.0 in | Wt 294.0 lb

## 2011-05-22 DIAGNOSIS — E785 Hyperlipidemia, unspecified: Secondary | ICD-10-CM

## 2011-05-22 DIAGNOSIS — I10 Essential (primary) hypertension: Secondary | ICD-10-CM

## 2011-05-22 DIAGNOSIS — E119 Type 2 diabetes mellitus without complications: Secondary | ICD-10-CM

## 2011-05-22 DIAGNOSIS — I2510 Atherosclerotic heart disease of native coronary artery without angina pectoris: Secondary | ICD-10-CM

## 2011-05-22 NOTE — Progress Notes (Signed)
HPI [name] returns today for the evaluation and management of his coronary disease. Status post knee and in October of 2012 subsequent bypass surgery. Other than some mild soreness in his chest, he has no complaints. He did not tolerate any statins including pravastatin. His last hemoglobin A1c was 6.2%. He is followed by Dr. Jacky Kindle of primary care  Medications reviewed and he is compliant. His wife is a Futures trader in the hospital.   Past Medical History  Diagnosis Date  . Morbidly obese   . Coronary artery disease     NSTEMI 10/12: LHC 11/24/10: mRCA 99% (diffuse), pLAD and Dx bifurcational lesion 60-70%, LAD 70-80% after Dx, dCFX 70%, OM1 70%, EF 40-45%.  Echo 11/24/10: EF normal, mild LVH;  s/p CABG with L-LAD, S-OM1/OM2, S-RV marg/RCA.  Marland Kitchen DM2 (diabetes mellitus, type 2)     TYPE II  . Hypertension   . Hyperlipidemia   . Anxiety   . GERD (gastroesophageal reflux disease)   . Back pain, chronic     s/p lumbar fusion  . IBS (irritable bowel syndrome)   . Pleural effusion, left 11/2010    Postop; resolved with p.o. diuresis    Current Outpatient Prescriptions  Medication Sig Dispense Refill  . ALPRAZolam (XANAX) 0.5 MG tablet Take 0.5 mg by mouth every 6 (six) hours as needed.        Marland Kitchen aspirin EC 81 MG tablet Take 81 mg by mouth every other day.       . clopidogrel (PLAVIX) 75 MG tablet Take 1 tablet (75 mg total) by mouth daily.  90 tablet  3  . docusate sodium (COLACE) 100 MG capsule Take 200 mg by mouth at bedtime.        Marland Kitchen glyBURIDE-metformin (GLUCOVANCE) 5-500 MG per tablet Take 1 tablet by mouth daily with breakfast. Taking a half a tablet once a day currently      . lisinopril (PRINIVIL,ZESTRIL) 20 MG tablet TAKE ONE TABLET BY MOUTH ONE TIME DAILY  30 tablet  0  . methocarbamol (ROBAXIN) 500 MG tablet Take 500 mg by mouth 3 (three) times daily as needed.        . metoprolol tartrate (LOPRESSOR) 25 MG tablet TAKE ONE TABLET BY MOUTH TWICE DAILY  60 tablet  0  . omega-3  acid ethyl esters (LOVAZA) 1 G capsule Take 2 g by mouth 2 (two) times daily.        . polyethylene glycol powder (MIRALAX) powder Take 17 g by mouth at bedtime as needed.        . pregabalin (LYRICA) 150 MG capsule Take 150 mg by mouth 2 (two) times daily.        Marland Kitchen pyridOXINE (VITAMIN B-6) 100 MG tablet Take 100 mg by mouth daily.        . ranitidine (ZANTAC) 150 MG tablet Take 150 mg by mouth daily.        . sertraline (ZOLOFT) 50 MG tablet Take 50 mg by mouth 2 (two) times daily.        . traMADol (ULTRAM) 50 MG tablet Take 50 mg by mouth every 6 (six) hours as needed.        . vitamin C (ASCORBIC ACID) 500 MG tablet Take 1,000 mg by mouth daily.        Marland Kitchen zolpidem (AMBIEN) 10 MG tablet Take 10 mg by mouth at bedtime as needed.          Allergies  Allergen Reactions  . Codeine  REACTION: HYPER  . Statins     Muscle weakness & fatigue    Family History  Problem Relation Age of Onset  . Hypertension Neg Hx   . Heart disease Neg Hx   . Peripheral vascular disease Neg Hx   . Diabetes Neg Hx     History   Social History  . Marital Status: Married    Spouse Name: N/A    Number of Children: N/A  . Years of Education: N/A   Occupational History  . RETIRED    Social History Main Topics  . Smoking status: Never Smoker   . Smokeless tobacco: Not on file  . Alcohol Use: No  . Drug Use: No  . Sexually Active: Not on file   Other Topics Concern  . Not on file   Social History Narrative   WATER AEROBICS 4-5 DAYS A WEEK    ROS ALL NEGATIVE EXCEPT THOSE NOTED IN HPI  PE  General Appearance: well developed, well nourished in no acute distress, obese HEENT: symmetrical face, PERRLA, good dentition  Neck: no JVD, thyromegaly, or adenopathy, trachea midline Chest: symmetric without deformity, well healed sternotomy Cardiac: PMI non-displaced, RRR, normal S1, S2, no gallop or murmur Lung: clear to ausculation and percussion Vascular: all pulses full without bruits    Abdominal: nondistended, nontender, good bowel sounds, no HSM, no bruits Extremities: no cyanosis, clubbing, 1+ edema,, no sign of DVT, no varicosities  Skin: normal color, no rashes Neuro: alert and oriented x 3, non-focal Pysch: normal affect  EKG  BMET    Component Value Date/Time   NA 136 12/19/2010 1120   K 4.6 12/19/2010 1120   CL 98 12/19/2010 1120   CO2 24 12/19/2010 1120   GLUCOSE 95 12/19/2010 1120   BUN 32* 12/19/2010 1120   CREATININE 1.2 12/19/2010 1120   CALCIUM 9.2 12/19/2010 1120   GFRNONAA >90 11/29/2010 0703   GFRAA >90 11/29/2010 0703    Lipid Panel     Component Value Date/Time   CHOL 184 03/18/2011 0835   TRIG 197.0* 03/18/2011 0835   HDL 30.70* 03/18/2011 0835   CHOLHDL 6 03/18/2011 0835   VLDL 39.4 03/18/2011 0835   LDLCALC 114* 03/18/2011 0835    CBC    Component Value Date/Time   WBC 10.4 12/11/2010 1605   RBC 3.11* 12/11/2010 1605   HGB 9.0* 12/11/2010 1605   HCT 27.4* 12/11/2010 1605   PLT 462.0* 12/11/2010 1605   MCV 87.9 12/11/2010 1605   MCH 28.1 12/01/2010 0600   MCHC 33.0 12/11/2010 1605   RDW 15.5* 12/11/2010 1605   LYMPHSABS 1.4 12/11/2010 1605   MONOABS 0.4 12/11/2010 1605   EOSABS 0.5 12/11/2010 1605   BASOSABS 0.1 12/11/2010 1605

## 2011-05-22 NOTE — Patient Instructions (Signed)
Your physician recommends that you continue on your current medications as directed. Please refer to the Current Medication list given to you today.   Your physician wants you to follow-up in: 1 year with Dr. Wall. You will receive a reminder letter in the mail two months in advance. If you don't receive a letter, please call our office to schedule the follow-up appointment.  

## 2011-05-22 NOTE — Assessment & Plan Note (Signed)
Stable. No change in medications or treatment. Will see back in the office in one year.

## 2011-06-25 ENCOUNTER — Telehealth: Payer: Self-pay | Admitting: Pharmacist

## 2011-06-25 NOTE — Telephone Encounter (Signed)
Called pt to reschedule Lipid Clinic appt that was due in April.  Pt states he is intolerant to statins and does not want to try any other medications at this time.  Would prefer to f/u with his PCP from this point forward.

## 2011-07-17 ENCOUNTER — Other Ambulatory Visit: Payer: Self-pay | Admitting: Physician Assistant

## 2012-03-11 ENCOUNTER — Other Ambulatory Visit: Payer: Self-pay | Admitting: *Deleted

## 2012-03-11 MED ORDER — CLOPIDOGREL BISULFATE 75 MG PO TABS: 75.0000 mg | ORAL_TABLET | Freq: Every day | ORAL | Status: DC

## 2012-04-17 ENCOUNTER — Other Ambulatory Visit: Payer: Self-pay | Admitting: Cardiology

## 2012-04-22 ENCOUNTER — Telehealth: Payer: Self-pay | Admitting: Internal Medicine

## 2012-04-22 NOTE — Telephone Encounter (Signed)
Best I can tell the patient was due for colonoscopy at age 57.  He is on Plavix according to his chart and will need an office visit.  Please schedule that for him.

## 2012-04-22 NOTE — Telephone Encounter (Signed)
Pt scheduled  

## 2012-04-27 ENCOUNTER — Encounter: Payer: Self-pay | Admitting: *Deleted

## 2012-04-28 ENCOUNTER — Ambulatory Visit (INDEPENDENT_AMBULATORY_CARE_PROVIDER_SITE_OTHER): Payer: 59 | Admitting: Physician Assistant

## 2012-04-28 ENCOUNTER — Encounter: Payer: Self-pay | Admitting: Physician Assistant

## 2012-04-28 VITALS — BP 138/74 | HR 65 | Ht 72.0 in | Wt 320.0 lb

## 2012-04-28 DIAGNOSIS — Z860100 Personal history of colon polyps, unspecified: Secondary | ICD-10-CM | POA: Insufficient documentation

## 2012-04-28 DIAGNOSIS — Z7901 Long term (current) use of anticoagulants: Secondary | ICD-10-CM

## 2012-04-28 MED ORDER — MOVIPREP 100 G PO SOLR: 1.0000 | Freq: Once | ORAL | Status: DC

## 2012-04-28 NOTE — Progress Notes (Signed)
Agree with initial assessment and plans 

## 2012-04-28 NOTE — Progress Notes (Signed)
Subjective:    Patient ID: Spencer [name], male    DOB: Feb 27, 1955, 57 y.o.   MRN: [id number]  HPI Spencer [name] is a very nice 57 year old white male known to Dr. Marina Goodell who comes in today to discuss followup colonoscopy. He last had colonoscopy in February 2002 and at that time had 2 diminutive polyps removed and path on these was consistent with hyperplastic polyps. He has history of coronary artery disease is status post multivessel CABG after an MI in October of 2012 and has been maintained on Plavix since. He also has adult onset diabetes mellitus, morbid obesity, hypertension, hyperlipidemia, and chronic low back pain with history of multilevel disc fusion. He has no current lower GI complaints but does use MiraLax on a fairly regular basis which she says helps quite a bit. He does have some chronic intermittent queasiness which he attributes to his medications.    Review of Systems  Constitutional: Negative.   HENT: Negative.   Eyes: Negative.   Respiratory: Negative.   Cardiovascular: Negative.   Gastrointestinal: Positive for nausea.  Endocrine: Negative.   Genitourinary: Negative.   Allergic/Immunologic: Negative.   Neurological: Negative.   Hematological: Negative.   Psychiatric/Behavioral: Negative.    Outpatient Prescriptions Prior to Visit  Medication Sig Dispense Refill  . ALPRAZolam (XANAX) 0.5 MG tablet Take 0.5 mg by mouth every 6 (six) hours as needed.        Marland Kitchen aspirin EC 81 MG tablet Take 81 mg by mouth every other day.       . clopidogrel (PLAVIX) 75 MG tablet Take 1 tablet (75 mg total) by mouth daily.  90 tablet  3  . docusate sodium (COLACE) 100 MG capsule Take 200 mg by mouth at bedtime.        Marland Kitchen glyBURIDE-metformin (GLUCOVANCE) 5-500 MG per tablet Take 1 tablet by mouth daily with breakfast. Taking a half a tablet once a day currently      . lisinopril (PRINIVIL,ZESTRIL) 20 MG tablet TAKE ONE TABLET BY MOUTH ONE TIME DAILY  30 tablet  0  . methocarbamol (ROBAXIN) 500  MG tablet Take 500 mg by mouth 3 (three) times daily as needed.        . metoprolol tartrate (LOPRESSOR) 25 MG tablet TAKE 1 TABLET BY MOUTH TWICE DAILY  180 tablet  0  . omega-3 acid ethyl esters (LOVAZA) 1 G capsule Take 2 g by mouth 2 (two) times daily.        . polyethylene glycol powder (MIRALAX) powder Take 17 g by mouth at bedtime as needed.        . pregabalin (LYRICA) 150 MG capsule Take 150 mg by mouth 2 (two) times daily.        Marland Kitchen pyridOXINE (VITAMIN B-6) 100 MG tablet Take 100 mg by mouth daily.       . ranitidine (ZANTAC) 150 MG tablet Take 150 mg by mouth daily.        . sertraline (ZOLOFT) 50 MG tablet Take 50 mg by mouth 2 (two) times daily.        . traMADol (ULTRAM) 50 MG tablet Take 50 mg by mouth every 6 (six) hours as needed.        . vitamin C (ASCORBIC ACID) 500 MG tablet Take 1,000 mg by mouth daily.        Marland Kitchen zolpidem (AMBIEN) 10 MG tablet Take 10 mg by mouth at bedtime as needed.        Marland Kitchen lisinopril (PRINIVIL,ZESTRIL) 20  MG tablet TAKE 1 TABLET BY MOUTH ONCE DAILY  90 tablet  0  . metoprolol tartrate (LOPRESSOR) 25 MG tablet TAKE ONE TABLET BY MOUTH TWICE DAILY  60 tablet  0   No facility-administered medications prior to visit.   Allergies  Allergen Reactions  . Codeine     REACTION: HYPER  . Statins     Muscle weakness & fatigue   Patient Active Problem List  Diagnosis  . CONTACT DERMATITIS  . Coronary artery disease  . Hypertension  . Hyperlipidemia  . Shortness of breath  . DM2 (diabetes mellitus, type 2)  . Morbid obesity  . Hx of colonic polyps  . Chronic anticoagulation   History  Substance Use Topics  . Smoking status: Never Smoker   . Smokeless tobacco: Never Used  . Alcohol Use: No  family history is negative for Hypertension, and Heart disease, and Peripheral vascular disease, and Diabetes, .     Objective:   Physical Exam  O. well-developed obese white male in no acute distress, pleasant blood pressure 138/74 pulse 65 height 6 foot  weight 320 BMI 43.Marland Kitchen HEENT; nontraumatic normocephalic EOMI PERRLA sclera anicteric,neck; Supple no JVD, Cardiovascular; regular rate and rhythm with S1-S2 no murmur or gallop, Pulmonary; clear bilaterally, Abdomen large soft nontender nondistended bowel sounds active no palpable mass or hepatosplenomegaly, Rectal; exam not done, Extremities; no clubbing cyanosis or edema skin warm and dry, Psych; mood and affect normal and appropriate        Assessment & Plan:  #48 57 year old male with history of hyperplastic colon polyps due for followup colonoscopy. #2 chronic antiplatelet therapy with Plavix #3 Mary artery disease status post MI October 2012 and multivessel CABG #4 morbid obesity-BMI 43 #5 adult-onset diabetes mellitus #6 hypertension #7 hyperlipidemia  Plan; schedule for colonoscopy with Dr. Marina Goodell, procedure was discussed in detail with the patient and he is agreeable to proceed. Will contact Dr. wall his cardiologist for permission to stop Plavix for 5 days prior to his procedure. Risks and benefits of holding anticoagulation were discussed. He will continue MiraLax 17 g by mouth in 8 ounces of water daily

## 2012-04-28 NOTE — Patient Instructions (Addendum)
We have given you a free voucher for the colonoscopy prep we sent to your pharmacy. We will call you once we get a response from Dr. Daleen Squibb regarding the Plavix medication. You have been scheduled for a colonoscopy with propofol. Please follow written instructions given to you at your visit today.  Please pick up your prep kit at the pharmacy within the next 1-3 days. If you use inhalers (even only as needed), please bring them with you on the day of your procedure.

## 2012-04-29 ENCOUNTER — Encounter: Payer: Self-pay | Admitting: *Deleted

## 2012-05-06 ENCOUNTER — Telehealth: Payer: Self-pay | Admitting: *Deleted

## 2012-05-06 NOTE — Telephone Encounter (Signed)
New Problem:    Patient called in returning your call.  Patient will be leaving town tonight and would like to reschedule appt on 05/07/12.  Please call back.

## 2012-05-06 NOTE — Telephone Encounter (Signed)
I spoke with pt this morning about appointment and clearance to stop plavix for 5 days prior.  Pt denies and chest pressure, angina, shortness of breath, or edema   His endoscopy is scheduled for 05/20/12.    Will forward this to Dr. Daleen Squibb for review.  Mylo Red RN

## 2012-05-06 NOTE — Telephone Encounter (Signed)
LMOVM rescheduled appt with Dr. Daleen Squibb for 05/07/12 at 10:30am Mylo Red RN

## 2012-05-07 ENCOUNTER — Ambulatory Visit: Payer: 59 | Admitting: Cardiology

## 2012-05-07 ENCOUNTER — Telehealth: Payer: Self-pay | Admitting: *Deleted

## 2012-05-07 NOTE — Telephone Encounter (Signed)
Received message from Dr. Daleen Squibb and his nurse Lisabeth Devoid.  Pt is cleared to stop the Plavix 5 days prior to the colonoscopy.  I called patient and left a message on the answering machine at # [phone].

## 2012-05-07 NOTE — Telephone Encounter (Signed)
Message copied by Derry Skill on Thu May 07, 2012  4:43 PM ------      Message from: Lisabeth Devoid F      Created: Thu May 07, 2012  3:17 PM       Pt is cleared to stop plavix 5 days prior to endoscopy.      Per Dr. Lonia Chimera RN            ----- Message -----         From: Derry Skill, CMA         Sent: 04/28/2012   9:28 AM           To: Gaylord Shih, MD, Barrie Folk, RN            04/28/2012                        RE: Spencer [name]      DOB: [birth year]      MRN: [id number]                  Dear Dr. [name],                   We have scheduled the above patient for an endoscopic procedure. Our records show that he is on anticoagulation therapy.             Please advise as to how long the patient may come off his therapy of Plavix prior to the procedure, which is scheduled for 05-20-2012.            Please fax back/ or route the completed form to [facility] CMA at [phone].             Sincerely,                  Lowry Ram CMA                        Amy Esterwood PA-C                    ------

## 2012-05-08 NOTE — Telephone Encounter (Signed)
Left message pt cleared to stop Plavix 5 days prior to endoscopy. E-sent to [facility] yesterday Mylo Red RN

## 2012-05-14 ENCOUNTER — Telehealth: Payer: Self-pay | Admitting: *Deleted

## 2012-05-14 NOTE — Telephone Encounter (Signed)
Message copied by Derry Skill on Thu May 14, 2012  1:43 PM ------      Message from: Lisabeth Devoid F      Created: Thu May 07, 2012  3:17 PM       Pt is cleared to stop plavix 5 days prior to endoscopy.      Per Dr. Lonia Chimera RN            ----- Message -----         From: Derry Skill, CMA         Sent: 04/28/2012   9:28 AM           To: Gaylord Shih, MD, Barrie Folk, RN            04/28/2012                        RE: Spencer [name]      DOB: 05-22-1955      MRN: [id number]                  Dear Dr. [name],                   We have scheduled the above patient for an endoscopic procedure. Our records show that he is on anticoagulation therapy.             Please advise as to how long the patient may come off his therapy of Plavix prior to the procedure, which is scheduled for 05-20-2012.            Please fax back/ or route the completed form to [facility] CMA at [phone].             Sincerely,                  Lowry Ram CMA                        Amy Esterwood PA-C                    ------

## 2012-05-14 NOTE — Telephone Encounter (Signed)
Message copied by Derry Skill on Thu May 14, 2012  1:40 PM ------      Message from: Lisabeth Devoid F      Created: Thu May 07, 2012  3:17 PM       Pt is cleared to stop plavix 5 days prior to endoscopy.      Per Dr. Lonia Chimera RN            ----- Message -----         From: Derry Skill, CMA         Sent: 04/28/2012   9:28 AM           To: Gaylord Shih, MD, Barrie Folk, RN            04/28/2012                        RE: Spencer [name]      DOB: 01-02-1956      MRN: [id number]                  Dear Dr. [name],                   We have scheduled the above patient for an endoscopic procedure. Our records show that he is on anticoagulation therapy.             Please advise as to how long the patient may come off his therapy of Plavix prior to the procedure, which is scheduled for 05-20-2012.            Please fax back/ or route the completed form to [facility] CMA at [phone].             Sincerely,                  Lowry Ram CMA                        Amy Esterwood PA-C                    ------

## 2012-05-14 NOTE — Telephone Encounter (Signed)
Did speak to patient and advised him to stop the Plavix tomorrow 05-15-2012 and resume on 05-21-2012, the day after his endoscopic procedure.

## 2012-05-14 NOTE — Telephone Encounter (Signed)
Called and left message that the patient can hold the Plavix for 5 days prior to the procedure.

## 2012-05-20 ENCOUNTER — Other Ambulatory Visit: Payer: Self-pay | Admitting: Internal Medicine

## 2012-05-20 ENCOUNTER — Ambulatory Visit (AMBULATORY_SURGERY_CENTER): Payer: 59 | Admitting: Internal Medicine

## 2012-05-20 ENCOUNTER — Encounter: Payer: Self-pay | Admitting: Internal Medicine

## 2012-05-20 VITALS — BP 132/61 | HR 68 | Temp 97.7°F | Resp 16 | Ht 72.0 in | Wt 320.0 lb

## 2012-05-20 DIAGNOSIS — Z86010 Personal history of colonic polyps: Secondary | ICD-10-CM

## 2012-05-20 DIAGNOSIS — Z538 Procedure and treatment not carried out for other reasons: Secondary | ICD-10-CM

## 2012-05-20 DIAGNOSIS — Z1211 Encounter for screening for malignant neoplasm of colon: Secondary | ICD-10-CM

## 2012-05-20 MED ORDER — SODIUM CHLORIDE 0.9 % IV SOLN: 500.0000 mL | INTRAVENOUS | Status: DC

## 2012-05-20 NOTE — Progress Notes (Signed)
Patient did not experience any of the following events: a burn prior to discharge; a fall within the facility; wrong site/side/patient/procedure/implant event; or a hospital transfer or hospital admission upon discharge from the facility. (G8907) Patient did not have preoperative order for IV antibiotic SSI prophylaxis. (G8918)  

## 2012-05-20 NOTE — Patient Instructions (Addendum)

## 2012-05-20 NOTE — Op Note (Addendum)
[facility] [address] [place], [address]   COLONOSCOPY PROCEDURE REPORT  PATIENT: Spencer [name], Spencer [name]  MR#: [id number] BIRTHDATE: 1955-03-21 , 56  yrs. old GENDER: Male ENDOSCOPIST: Roxy Cedar, MD REFERRED BY:.  Self / Office PROCEDURE DATE:  05/20/2012 PROCEDURE:   Colonoscopy, screening ASA CLASS:   Class III INDICATIONS:Average risk patient for colon cancer.   Hyperplastic polyps 2002 MEDICATIONS: MAC sedation, administered by CRNA and propofol (Diprivan) 300mg  IV  DESCRIPTION OF PROCEDURE:   After the risks benefits and alternatives of the procedure were thoroughly explained, informed consent was obtained.  A digital rectal exam revealed no abnormalities of the rectum.   The LB [id number]  endoscope was introduced through the anus and advanced to the cecum, which was identified by the ileocecal valve. No adverse events experienced.   Limited by fair preparation.   Limited by a redundant colon.   The quality of the prep was Moviprep fair  The instrument was then slowly withdrawn as the colon was fully examined.      COLON FINDINGS: The colonic preparation was fair.  The colon was redundant and advancement was made difficult by the patient's body habitus.  The scope could be advanced only to the ileocecal valve. No abnormalities found, though significant prep limitations. Retroflexed views revealed no abnormalities. The time to cecum=11 minutes 10 seconds.  Withdrawal time=6 minutes 54 seconds.  The scope was withdrawn and the procedure completed. COMPLICATIONS: There were no complications.  ENDOSCOPIC IMPRESSION: 1. Exam compromised by fair preparation 2. Complete exam (redundant colon, body habitus)  RECOMMENDATIONS: 1.Repeat Colonoscopy in 1 year.. Would plan on more extensive preparation. As well, would use the newly obtained the enteroscope in hopes of accessing the cecal base. He will need MAC/propofol. 2. Resume Plavix therapy  today   eSigned:  Roxy Cedar, MD 05/20/2012 3:45 PM   cc: Geoffry Paradise, MD, Gaylord Shih, MD, and The Patient

## 2012-05-21 ENCOUNTER — Ambulatory Visit: Payer: 59 | Admitting: Cardiology

## 2012-05-21 ENCOUNTER — Telehealth: Payer: Self-pay | Admitting: *Deleted

## 2012-05-21 NOTE — Telephone Encounter (Signed)
  Follow up Call-  Call back number 05/20/2012  Post procedure Call Back phone  # [phone]  Permission to leave phone message Yes     Patient questions:  Do you have a fever, pain , or abdominal swelling? no Pain Score  0 *  Have you tolerated food without any problems? yes  Have you been able to return to your normal activities? yes  Do you have any questions about your discharge instructions: Diet   no Medications  no Follow up visit  no  Do you have questions or concerns about your Care? no  Actions: * If pain score is 4 or above: No action needed, pain <4.

## 2012-05-26 ENCOUNTER — Encounter: Payer: Self-pay | Admitting: Cardiology

## 2012-05-26 ENCOUNTER — Ambulatory Visit (INDEPENDENT_AMBULATORY_CARE_PROVIDER_SITE_OTHER): Payer: 59 | Admitting: Cardiology

## 2012-05-26 VITALS — BP 124/82 | HR 64 | Ht 72.0 in | Wt 320.0 lb

## 2012-05-26 DIAGNOSIS — I2510 Atherosclerotic heart disease of native coronary artery without angina pectoris: Secondary | ICD-10-CM

## 2012-05-26 DIAGNOSIS — Z7901 Long term (current) use of anticoagulants: Secondary | ICD-10-CM

## 2012-05-26 DIAGNOSIS — I10 Essential (primary) hypertension: Secondary | ICD-10-CM

## 2012-05-26 DIAGNOSIS — R0989 Other specified symptoms and signs involving the circulatory and respiratory systems: Secondary | ICD-10-CM

## 2012-05-26 DIAGNOSIS — E785 Hyperlipidemia, unspecified: Secondary | ICD-10-CM

## 2012-05-26 DIAGNOSIS — E119 Type 2 diabetes mellitus without complications: Secondary | ICD-10-CM

## 2012-05-26 NOTE — Assessment & Plan Note (Signed)
Stable. Continue tight control of blood sugar. We'll discontinue Plavix. Continue aspirin.

## 2012-05-26 NOTE — Assessment & Plan Note (Signed)
Cannot take statins 

## 2012-05-26 NOTE — Assessment & Plan Note (Signed)
Check carotid ultrasound.  

## 2012-05-26 NOTE — Progress Notes (Signed)
HPI [name] returns today for evaluation and management of his history of coronary artery disease. He is status post chronic bypass grafting October 2012. He had mild left ventricular dysfunction with an EF of 45%.  He denies any angina. He has occasional sharp pain over his incision is associated with chest Brodric Schauer movement.  His last hemoglobin A1c was 7.4%. He is intolerant of statins. He is still on Plavix for unknown reason.  He denies any foot ulcers or lesions. His wife is a Engineer, civil (consulting) and helps him take great care of his feet.  Past Medical History  Diagnosis Date  . Morbidly obese   . Coronary artery disease     NSTEMI 10/12: LHC 11/24/10: mRCA 99% (diffuse), pLAD and Dx bifurcational lesion 60-70%, LAD 70-80% after Dx, dCFX 70%, OM1 70%, EF 40-45%.  Echo 11/24/10: EF normal, mild LVH;  s/p CABG with L-LAD, S-OM1/OM2, S-RV marg/RCA.  Marland Kitchen DM2 (diabetes mellitus, type 2)     TYPE II  . Hypertension   . Hyperlipidemia   . Anxiety   . GERD (gastroesophageal reflux disease)   . Back pain, chronic     s/p lumbar fusion  . IBS (irritable bowel syndrome)   . Pleural effusion, left 11/2010    Postop; resolved with p.o. diuresis  . Benign neoplasm of colon 04/01/2000    Hyperplastic  . Obesity   . Myocardial infarction     Current Outpatient Prescriptions  Medication Sig Dispense Refill  . ALPRAZolam (XANAX) 0.5 MG tablet Take 0.5 mg by mouth every 6 (six) hours as needed.        Marland Kitchen aspirin EC 81 MG tablet Take 81 mg by mouth every other day.       . clopidogrel (PLAVIX) 75 MG tablet Take 1 tablet (75 mg total) by mouth daily.  90 tablet  3  . docusate sodium (COLACE) 100 MG capsule Take 200 mg by mouth at bedtime.        Marland Kitchen glyBURIDE-metformin (GLUCOVANCE) 5-500 MG per tablet Take 1 tablet by mouth daily with breakfast. Taking a half a tablet once a day currently      . lisinopril (PRINIVIL,ZESTRIL) 20 MG tablet TAKE ONE TABLET BY MOUTH ONE TIME DAILY  30 tablet  0  . methocarbamol  (ROBAXIN) 500 MG tablet Take 500 mg by mouth 3 (three) times daily as needed.        . metoprolol tartrate (LOPRESSOR) 25 MG tablet TAKE 1 TABLET BY MOUTH TWICE DAILY  180 tablet  0  . omega-3 acid ethyl esters (LOVAZA) 1 G capsule Take 2 g by mouth 2 (two) times daily.        . polyethylene glycol powder (MIRALAX) powder Take 17 g by mouth at bedtime as needed.        . pregabalin (LYRICA) 150 MG capsule Take 150 mg by mouth 2 (two) times daily.        Marland Kitchen pyridOXINE (VITAMIN B-6) 100 MG tablet Take 100 mg by mouth daily.       . ranitidine (ZANTAC) 150 MG tablet Take 150 mg by mouth daily.        . sertraline (ZOLOFT) 50 MG tablet Take 50 mg by mouth 2 (two) times daily.        . traMADol (ULTRAM) 50 MG tablet Take 50 mg by mouth every 6 (six) hours as needed.        . vitamin C (ASCORBIC ACID) 500 MG tablet Take 1,000 mg by mouth daily.        Marland Kitchen  zolpidem (AMBIEN) 10 MG tablet Take 10 mg by mouth at bedtime as needed.         No current facility-administered medications for this visit.    Allergies  Allergen Reactions  . Codeine     REACTION: HYPER  . Statins     Muscle weakness & fatigue    Family History  Problem Relation Age of Onset  . Hypertension Neg Hx   . Heart disease Neg Hx   . Peripheral vascular disease Neg Hx   . Diabetes Neg Hx     History   Social History  . Marital Status: Married    Spouse Name: N/A    Number of Children: N/A  . Years of Education: N/A   Occupational History  . RETIRED   . disabled    Social History Main Topics  . Smoking status: Never Smoker   . Smokeless tobacco: Never Used  . Alcohol Use: No  . Drug Use: No  . Sexually Active: Not on file   Other Topics Concern  . Not on file   Social History Narrative   WATER AEROBICS 4-5 DAYS A WEEK    ROS ALL NEGATIVE EXCEPT THOSE NOTED IN HPI  PE  General Appearance: well developed, well nourished in no acute distress, obese HEENT: symmetrical face, PERRLA, good dentition  Neck:  no JVD, thyromegaly, or adenopathy, trachea midline Chest: symmetric without deformity Cardiac: PMI non-displaced, RRR, normal S1, S2, no gallop or murmur Lung: clear to ausculation and percussion Vascular: all pulses full, very soft right carotid bruit with good upstroke. Abdominal: nondistended, nontender, good bowel sounds, no HSM, no bruits Extremities: no cyanosis, clubbing or edema, no sign of DVT, no varicosities  Skin: normal color, no rashes, very cool and sweaty. He says is like this all the time. Neuro: alert and oriented x 3, non-focal Pysch: normal affect  EKG Normal sinus rhythm, normal EKG  BMET    Component Value Date/Time   NA 136 12/19/2010 1120   K 4.6 12/19/2010 1120   CL 98 12/19/2010 1120   CO2 24 12/19/2010 1120   GLUCOSE 95 12/19/2010 1120   BUN 32* 12/19/2010 1120   CREATININE 1.2 12/19/2010 1120   CALCIUM 9.2 12/19/2010 1120   GFRNONAA >90 11/29/2010 0703   GFRAA >90 11/29/2010 0703    Lipid Panel     Component Value Date/Time   CHOL 184 03/18/2011 0835   TRIG 197.0* 03/18/2011 0835   HDL 30.70* 03/18/2011 0835   CHOLHDL 6 03/18/2011 0835   VLDL 39.4 03/18/2011 0835   LDLCALC 114* 03/18/2011 0835    CBC    Component Value Date/Time   WBC 10.4 12/11/2010 1605   RBC 3.11* 12/11/2010 1605   HGB 9.0* 12/11/2010 1605   HCT 27.4* 12/11/2010 1605   PLT 462.0* 12/11/2010 1605   MCV 87.9 12/11/2010 1605   MCH 28.1 12/01/2010 0600   MCHC 33.0 12/11/2010 1605   RDW 15.5* 12/11/2010 1605   LYMPHSABS 1.4 12/11/2010 1605   MONOABS 0.4 12/11/2010 1605   EOSABS 0.5 12/11/2010 1605   BASOSABS 0.1 12/11/2010 1605

## 2012-05-26 NOTE — Patient Instructions (Addendum)
Stop taking Plavix per Dr. Daleen Squibb.  Your physician wants you to follow-up in: 1 Year with Dr. Wyline Mood.  You will receive a reminder letter in the mail two months in advance. If you don't receive a letter, please call our office to schedule the follow-up appointment.  Your physician has requested that you have a carotid duplex. This test is an ultrasound of the carotid arteries in your neck. It looks at blood flow through these arteries that supply the brain with blood. Allow one hour for this exam. There are no restrictions or special instructions.

## 2012-05-26 NOTE — Addendum Note (Signed)
Addended by: Micki Riley C on: 05/26/2012 10:11 AM   Modules accepted: Orders

## 2012-06-03 ENCOUNTER — Encounter (INDEPENDENT_AMBULATORY_CARE_PROVIDER_SITE_OTHER): Payer: 59

## 2012-06-03 DIAGNOSIS — I6529 Occlusion and stenosis of unspecified carotid artery: Secondary | ICD-10-CM

## 2012-06-03 DIAGNOSIS — R0989 Other specified symptoms and signs involving the circulatory and respiratory systems: Secondary | ICD-10-CM

## 2013-02-02 ENCOUNTER — Ambulatory Visit (HOSPITAL_COMMUNITY): Payer: 59 | Attending: Cardiology

## 2013-02-02 ENCOUNTER — Encounter: Payer: Self-pay | Admitting: *Deleted

## 2013-02-02 DIAGNOSIS — I658 Occlusion and stenosis of other precerebral arteries: Secondary | ICD-10-CM | POA: Insufficient documentation

## 2013-02-02 DIAGNOSIS — I6529 Occlusion and stenosis of unspecified carotid artery: Secondary | ICD-10-CM | POA: Insufficient documentation

## 2013-02-02 DIAGNOSIS — R0989 Other specified symptoms and signs involving the circulatory and respiratory systems: Secondary | ICD-10-CM | POA: Insufficient documentation

## 2013-02-02 DIAGNOSIS — E119 Type 2 diabetes mellitus without complications: Secondary | ICD-10-CM | POA: Insufficient documentation

## 2013-02-02 DIAGNOSIS — Z951 Presence of aortocoronary bypass graft: Secondary | ICD-10-CM | POA: Insufficient documentation

## 2013-02-02 DIAGNOSIS — E785 Hyperlipidemia, unspecified: Secondary | ICD-10-CM | POA: Insufficient documentation

## 2013-02-02 DIAGNOSIS — I2510 Atherosclerotic heart disease of native coronary artery without angina pectoris: Secondary | ICD-10-CM | POA: Insufficient documentation

## 2013-05-27 ENCOUNTER — Encounter: Payer: Self-pay | Admitting: Internal Medicine

## 2013-06-17 ENCOUNTER — Other Ambulatory Visit: Payer: Self-pay | Admitting: Cardiology

## 2013-07-22 ENCOUNTER — Emergency Department (HOSPITAL_COMMUNITY): Payer: 59

## 2013-07-22 ENCOUNTER — Encounter (HOSPITAL_COMMUNITY): Payer: Self-pay | Admitting: Emergency Medicine

## 2013-07-22 ENCOUNTER — Inpatient Hospital Stay (HOSPITAL_COMMUNITY)
Admission: EM | Admit: 2013-07-22 | Discharge: 2013-07-24 | DRG: 247 | Disposition: A | Discharge status: Home / Self Care | Payer: 59 | Attending: Cardiology | Admitting: Cardiology

## 2013-07-22 DIAGNOSIS — I152 Hypertension secondary to endocrine disorders: Secondary | ICD-10-CM | POA: Diagnosis present

## 2013-07-22 DIAGNOSIS — Z7982 Long term (current) use of aspirin: Secondary | ICD-10-CM | POA: Diagnosis not present

## 2013-07-22 DIAGNOSIS — Y832 Surgical operation with anastomosis, bypass or graft as the cause of abnormal reaction of the patient, or of later complication, without mention of misadventure at the time of the procedure: Secondary | ICD-10-CM | POA: Diagnosis present

## 2013-07-22 DIAGNOSIS — K219 Gastro-esophageal reflux disease without esophagitis: Secondary | ICD-10-CM | POA: Diagnosis present

## 2013-07-22 DIAGNOSIS — K589 Irritable bowel syndrome without diarrhea: Secondary | ICD-10-CM | POA: Diagnosis present

## 2013-07-22 DIAGNOSIS — Z981 Arthrodesis status: Secondary | ICD-10-CM | POA: Diagnosis not present

## 2013-07-22 DIAGNOSIS — F411 Generalized anxiety disorder: Secondary | ICD-10-CM | POA: Diagnosis present

## 2013-07-22 DIAGNOSIS — I2510 Atherosclerotic heart disease of native coronary artery without angina pectoris: Secondary | ICD-10-CM | POA: Diagnosis present

## 2013-07-22 DIAGNOSIS — E785 Hyperlipidemia, unspecified: Secondary | ICD-10-CM | POA: Diagnosis present

## 2013-07-22 DIAGNOSIS — E119 Type 2 diabetes mellitus without complications: Secondary | ICD-10-CM | POA: Diagnosis present

## 2013-07-22 DIAGNOSIS — Z6841 Body Mass Index (BMI) 40.0 and over, adult: Secondary | ICD-10-CM | POA: Diagnosis not present

## 2013-07-22 DIAGNOSIS — I200 Unstable angina: Secondary | ICD-10-CM | POA: Diagnosis present

## 2013-07-22 DIAGNOSIS — Z79899 Other long term (current) drug therapy: Secondary | ICD-10-CM | POA: Diagnosis not present

## 2013-07-22 DIAGNOSIS — R0789 Other chest pain: Secondary | ICD-10-CM | POA: Diagnosis not present

## 2013-07-22 DIAGNOSIS — I252 Old myocardial infarction: Secondary | ICD-10-CM | POA: Diagnosis not present

## 2013-07-22 DIAGNOSIS — I10 Essential (primary) hypertension: Secondary | ICD-10-CM | POA: Diagnosis not present

## 2013-07-22 DIAGNOSIS — M25569 Pain in unspecified knee: Secondary | ICD-10-CM | POA: Diagnosis present

## 2013-07-22 DIAGNOSIS — R079 Chest pain, unspecified: Secondary | ICD-10-CM

## 2013-07-22 DIAGNOSIS — I208 Other forms of angina pectoris: Secondary | ICD-10-CM

## 2013-07-22 DIAGNOSIS — I25810 Atherosclerosis of coronary artery bypass graft(s) without angina pectoris: Secondary | ICD-10-CM | POA: Diagnosis present

## 2013-07-22 DIAGNOSIS — I2582 Chronic total occlusion of coronary artery: Secondary | ICD-10-CM | POA: Diagnosis present

## 2013-07-22 DIAGNOSIS — I209 Angina pectoris, unspecified: Secondary | ICD-10-CM | POA: Diagnosis not present

## 2013-07-22 DIAGNOSIS — T82897A Other specified complication of cardiac prosthetic devices, implants and grafts, initial encounter: Secondary | ICD-10-CM | POA: Diagnosis present

## 2013-07-22 DIAGNOSIS — E1165 Type 2 diabetes mellitus with hyperglycemia: Secondary | ICD-10-CM

## 2013-07-22 DIAGNOSIS — E1159 Type 2 diabetes mellitus with other circulatory complications: Secondary | ICD-10-CM | POA: Diagnosis present

## 2013-07-22 HISTORY — DX: Unspecified osteoarthritis, unspecified site: M19.90

## 2013-07-22 HISTORY — DX: Major depressive disorder, single episode, unspecified: F32.9

## 2013-07-22 HISTORY — DX: Pain in left knee: M25.562

## 2013-07-22 HISTORY — DX: Depression, unspecified: F32.A

## 2013-07-22 HISTORY — DX: Pain in right knee: M25.561

## 2013-07-22 HISTORY — DX: Panic disorder (episodic paroxysmal anxiety): F41.0

## 2013-07-22 LAB — CBC
HCT: 36.7 % — ABNORMAL LOW (ref 39.0–52.0)
Hemoglobin: 12.6 g/dL — ABNORMAL LOW (ref 13.0–17.0)
MCH: 29.4 pg (ref 26.0–34.0)
MCHC: 34.3 g/dL (ref 30.0–36.0)
MCV: 85.7 fL (ref 78.0–100.0)
Platelets: 211 10*3/uL (ref 150–400)
RBC: 4.28 MIL/uL (ref 4.22–5.81)
RDW: 13.6 % (ref 11.5–15.5)
WBC: 8 10*3/uL (ref 4.0–10.5)

## 2013-07-22 LAB — BASIC METABOLIC PANEL
BUN: 19 mg/dL (ref 6–23)
CHLORIDE: 99 meq/L (ref 96–112)
CO2: 22 mEq/L (ref 19–32)
Calcium: 9 mg/dL (ref 8.4–10.5)
Creatinine, Ser: 0.87 mg/dL (ref 0.50–1.35)
GFR calc Af Amer: >90 mL/min (ref >90)
GFR calc non Af Amer: >90 mL/min (ref >90)
GLUCOSE: 251 mg/dL — AB (ref 70–99)
Potassium: 4.7 mEq/L (ref 3.7–5.3)
SODIUM: 137 meq/L (ref 137–147)

## 2013-07-22 LAB — I-STAT TROPONIN, ED: Troponin i, poc: 0.01 ng/mL (ref 0.00–0.08)

## 2013-07-22 LAB — GLUCOSE, CAPILLARY: Glucose-Capillary: 180 mg/dL — ABNORMAL HIGH (ref 70–99)

## 2013-07-22 LAB — TROPONIN I: Troponin I: <0.3 ng/mL (ref <0.30)

## 2013-07-22 MED ORDER — ASPIRIN EC 81 MG PO TBEC
81.0000 mg | DELAYED_RELEASE_TABLET | Freq: Every day | ORAL | Status: DC
  Administered 2013-07-24: 81 mg via ORAL
  Filled 2013-07-22: qty 1

## 2013-07-22 MED ORDER — VITAMIN D3 25 MCG (1000 UNIT) PO TABS
1000.0000 [IU] | ORAL_TABLET | Freq: Every day | ORAL | Status: DC
  Administered 2013-07-24: 09:00:00 1000 [IU] via ORAL
  Filled 2013-07-22 (×2): qty 1

## 2013-07-22 MED ORDER — PREGABALIN 25 MG PO CAPS
150.0000 mg | ORAL_CAPSULE | Freq: Two times a day (BID) | ORAL | Status: DC
  Administered 2013-07-22 – 2013-07-24 (×3): 150 mg via ORAL
  Filled 2013-07-22: qty 2
  Filled 2013-07-22 (×2): qty 6

## 2013-07-22 MED ORDER — SODIUM CHLORIDE 0.9 % IV SOLN
INTRAVENOUS | Status: DC
  Administered 2013-07-23: 04:00:00 via INTRAVENOUS

## 2013-07-22 MED ORDER — SODIUM CHLORIDE 0.9 % IJ SOLN: 3.0000 mL | INTRAMUSCULAR | Status: DC | PRN

## 2013-07-22 MED ORDER — METOPROLOL TARTRATE 25 MG PO TABS
25.0000 mg | ORAL_TABLET | Freq: Two times a day (BID) | ORAL | Status: DC
  Administered 2013-07-22: 25 mg via ORAL
  Filled 2013-07-22 (×3): qty 1

## 2013-07-22 MED ORDER — NITROGLYCERIN 0.4 MG SL SUBL: 0.4000 mg | SUBLINGUAL_TABLET | SUBLINGUAL | Status: DC | PRN

## 2013-07-22 MED ORDER — METHOCARBAMOL 500 MG PO TABS
500.0000 mg | ORAL_TABLET | Freq: Three times a day (TID) | ORAL | Status: DC
  Administered 2013-07-22 – 2013-07-24 (×4): 500 mg via ORAL
  Filled 2013-07-22 (×11): qty 1

## 2013-07-22 MED ORDER — OMEGA-3-ACID ETHYL ESTERS 1 G PO CAPS
1.0000 g | ORAL_CAPSULE | Freq: Two times a day (BID) | ORAL | Status: DC
  Administered 2013-07-22 – 2013-07-24 (×3): 1 g via ORAL
  Filled 2013-07-22 (×6): qty 1

## 2013-07-22 MED ORDER — SODIUM CHLORIDE 0.9 % IV SOLN: 250.0000 mL | INTRAVENOUS | Status: DC | PRN

## 2013-07-22 MED ORDER — INSULIN ASPART 100 UNIT/ML ~~LOC~~ SOLN
0.0000 [IU] | Freq: Three times a day (TID) | SUBCUTANEOUS | Status: DC
  Administered 2013-07-23: 3 [IU] via SUBCUTANEOUS
  Administered 2013-07-23 – 2013-07-24 (×2): 4 [IU] via SUBCUTANEOUS

## 2013-07-22 MED ORDER — HEPARIN BOLUS VIA INFUSION
4000.0000 [IU] | Freq: Once | INTRAVENOUS | Status: AC
  Administered 2013-07-22: 4000 [IU] via INTRAVENOUS
  Filled 2013-07-22: qty 4000

## 2013-07-22 MED ORDER — HEPARIN (PORCINE) IN NACL 100-0.45 UNIT/ML-% IJ SOLN
1950.0000 [IU]/h | INTRAMUSCULAR | Status: DC
  Administered 2013-07-22: 1550 [IU]/h via INTRAVENOUS
  Administered 2013-07-23: 1950 [IU]/h via INTRAVENOUS
  Filled 2013-07-22 (×3): qty 250

## 2013-07-22 MED ORDER — ASPIRIN 81 MG PO CHEW
81.0000 mg | CHEWABLE_TABLET | ORAL | Status: AC
  Administered 2013-07-23: 81 mg via ORAL
  Filled 2013-07-22: qty 1

## 2013-07-22 MED ORDER — ASPIRIN 81 MG PO CHEW
324.0000 mg | CHEWABLE_TABLET | ORAL | Status: AC
Start: 2013-07-22 — End: 2013-07-22
  Administered 2013-07-22: 324 mg via ORAL
  Filled 2013-07-22: qty 4

## 2013-07-22 MED ORDER — SERTRALINE HCL 50 MG PO TABS
50.0000 mg | ORAL_TABLET | Freq: Two times a day (BID) | ORAL | Status: DC
  Administered 2013-07-22 – 2013-07-24 (×3): 50 mg via ORAL
  Filled 2013-07-22 (×6): qty 1

## 2013-07-22 MED ORDER — ZOLPIDEM TARTRATE 5 MG PO TABS
10.0000 mg | ORAL_TABLET | Freq: Every day | ORAL | Status: DC
  Administered 2013-07-22 – 2013-07-23 (×2): 10 mg via ORAL
  Filled 2013-07-22 (×2): qty 2

## 2013-07-22 MED ORDER — ASPIRIN 300 MG RE SUPP: 300.0000 mg | RECTAL | Status: AC

## 2013-07-22 MED ORDER — FAMOTIDINE 20 MG PO TABS
20.0000 mg | ORAL_TABLET | Freq: Every day | ORAL | Status: DC
  Administered 2013-07-24: 20 mg via ORAL
  Filled 2013-07-22 (×2): qty 1

## 2013-07-22 MED ORDER — NITROGLYCERIN 0.4 MG SL SUBL
0.4000 mg | SUBLINGUAL_TABLET | SUBLINGUAL | Status: DC | PRN
  Administered 2013-07-22: 0.4 mg via SUBLINGUAL
  Filled 2013-07-22: qty 1

## 2013-07-22 MED ORDER — LISINOPRIL 20 MG PO TABS
20.0000 mg | ORAL_TABLET | Freq: Every day | ORAL | Status: DC
  Filled 2013-07-22: qty 1

## 2013-07-22 MED ORDER — ASPIRIN EC 81 MG PO TBEC: 81.0000 mg | DELAYED_RELEASE_TABLET | Freq: Every day | ORAL | Status: DC

## 2013-07-22 MED ORDER — SODIUM CHLORIDE 0.9 % IJ SOLN: 3.0000 mL | Freq: Two times a day (BID) | INTRAMUSCULAR | Status: DC

## 2013-07-22 NOTE — ED Notes (Signed)
Transporting patient to new room assignment. 

## 2013-07-22 NOTE — Progress Notes (Signed)
ANTICOAGULATION CONSULT NOTE - Initial Consult  Pharmacy Consult for Heparin Indication: chest pain/ACS  Allergies  Allergen Reactions  . Codeine Other (See Comments)    hyperactive  . Other Nausea And Vomiting    Mayonnaise causes nausea and vomiting  . Statins     Muscle weakness & fatigue    Patient Measurements: Height: 6\' 1"  (185.4 cm) Weight: 315 lb (142.883 kg) IBW/kg (Calculated) : 79.9 Heparin Dosing Weight: 112kg  Vital Signs: Temp: 98.3 F (36.8 C) (06/04 1822) Temp src: Oral (06/04 1822) BP: 153/62 mmHg (06/04 1822) Pulse Rate: 61 (06/04 1822)  Labs:  Recent Labs  07/22/13 1407  HGB 12.6*  HCT 36.7*  PLT 211  CREATININE 0.87    Estimated Creatinine Clearance: 140.9 ml/min (by C-G formula based on Cr of 0.87).   Medical History: Past Medical History  Diagnosis Date  . Morbidly obese   . Coronary artery disease     NSTEMI 10/12: LHC 11/24/10: mRCA 99% (diffuse), pLAD and Dx bifurcational lesion 60-70%, LAD 70-80% after Dx, dCFX 70%, OM1 70%, EF 40-45%.  Echo 11/24/10: EF normal, mild LVH;  s/p CABG with L-LAD, S-OM1/OM2, S-RV marg/RCA.  Marland Kitchen DM2 (diabetes mellitus, type 2)     TYPE II  . Hypertension   . Hyperlipidemia   . Anxiety   . GERD (gastroesophageal reflux disease)   . Back pain, chronic     s/p lumbar fusion  . IBS (irritable bowel syndrome)   . Pleural effusion, left 11/2010    Postop; resolved with p.o. diuresis  . Benign neoplasm of colon 04/01/2000    Hyperplastic  . Obesity   . Myocardial infarction     Medications:  Prescriptions prior to admission  Medication Sig Dispense Refill  . Ascorbic Acid (VITAMIN C) 1000 MG tablet Take 1,000 mg by mouth daily.      Marland Kitchen aspirin EC 81 MG tablet Take 81 mg by mouth daily.       . cholecalciferol (VITAMIN D) 1000 UNITS tablet Take 1,000 Units by mouth daily.      Marland Kitchen docusate sodium (COLACE) 100 MG capsule Take 200 mg by mouth at bedtime.        Marland Kitchen glyBURIDE-metformin (GLUCOVANCE) 5-500 MG  per tablet Take 2 tablets by mouth 2 (two) times daily with a meal. Taking a half a tablet once a day currently      . lisinopril (PRINIVIL,ZESTRIL) 20 MG tablet Take 20 mg by mouth daily.      . methocarbamol (ROBAXIN) 500 MG tablet Take 500 mg by mouth 3 (three) times daily. scheduled      . metoprolol tartrate (LOPRESSOR) 25 MG tablet Take 25 mg by mouth 2 (two) times daily.      . metroNIDAZOLE (METROGEL) 1 % gel Apply 1 application topically daily as needed (rosacea).      Marland Kitchen omega-3 acid ethyl esters (LOVAZA) 1 G capsule Take 1 g by mouth 2 (two) times daily.       . polyethylene glycol powder (MIRALAX) powder Take 17 g by mouth at bedtime as needed (constipation).       . pregabalin (LYRICA) 150 MG capsule Take 150 mg by mouth 2 (two) times daily.        Marland Kitchen pyridOXINE (VITAMIN B-6) 100 MG tablet Take 100 mg by mouth daily.       . ranitidine (ZANTAC) 300 MG tablet Take 300 mg by mouth daily.      . sertraline (ZOLOFT) 50 MG tablet Take 50  mg by mouth 2 (two) times daily.        . traMADol (ULTRAM) 50 MG tablet Take 50 mg by mouth 3 (three) times daily. scheduled      . zolpidem (AMBIEN) 10 MG tablet Take 10 mg by mouth at bedtime.         Assessment: 58 yo M admitted 07/22/2013  With chest pain.  Pharmacy consulted to dose heparin.  PMH: CAD CABG '12, DM, HTN, hyperlipidemia,   Coag: ACS, patient denies recent bleeding.  Goal of Therapy:  Heparin level 0.3-0.7 units/ml Monitor platelets by anticoagulation protocol: Yes   Plan:  Heparin 4000 units iv x 1 then 1550 units/hr Heparin level 6h after ggt starts Daily heparin level and CBC  Thank you for allowing pharmacy to be a part of this patients care team.  Rowe Robert Pharm.D., BCPS, AQ-Cardiology Clinical Pharmacist 07/22/2013 6:38 PM Pager: [phone] Phone: [phone]

## 2013-07-22 NOTE — H&P (Signed)
Chief Complaint: Chest Pain Primary Cardiologist: Dr. Verl Blalock  HPI: The patient is a 58 y/o male with known history of CAD, who presents to [facility] with a complaint of chest pain. His wife is a Equities trader and works as a Tourist information centre manager.  In October 2012, he suffered a NSTEMI. He underwent a cardiac catheterization on  November 23, 2010, which showed a 99% stenosis of the right coronary artery, an 80% stenosis of the OM and 70-80% stenosis of the LAD diagonal. Left ventricular ejection fraction was well preserved. Subsequently, he underwent CABG x 6 by Dr. Nils Pyle. His grafting included left internal mammary artery to the left anterior descending - free graft, sequential saphenous vein graft to the first and second obtuse marginals, sequential saphenous vein graft to the right ventricular marginal in the right coronary artery, saphenous vein graft to the first diagonal. His other PMH is significant for poorly controlled T2DM (last reported hemoglobin A1c was 7.6), HTN and HLD (intolerant to statins). He is followed medically by Dr. Joya Salm. His last office visit with Dr. Verl Blalock was over a year ago in April 2014. He has not followed up with a cardiologist since his retirement.  He states that he had been doing fairly well post CABG, up until 2 weeks ago when he developed intermittent left-sided chest discomfort, described as a tight/squeezing sensation. This is slightly different from the anginal pain he experienced prior to his MI in 2012. At that time his chest discomfort was more of a burning sensation. His recent symptoms occur at rest and are relieved with sublingual nitroglycerin. He denies associated dyspnea. However, he does endorse associated diaphoresis. No nausea or vomiting, dizziness, syncope or near-syncope. No orthopnea, PND or lower extremity edema area At its worst, his chest discomfort has been a 2/10. However, due to ongoing symptoms x 2 weeks, he decided to present to the ED for evaluation  to rule out cardiac etiologies. He states he has been fully compliant with his home medications.      In the ER, he was given sublingual nitroglycerin and his pain completely resolved. His EKG is nonischemic. POC troponin is negative.  Chest x-ray is unremarkable.                              Past Medical History  Diagnosis Date  . Morbidly obese   . Coronary artery disease     NSTEMI 10/12: LHC 11/24/10: mRCA 99% (diffuse), pLAD and Dx bifurcational lesion 60-70%, LAD 70-80% after Dx, dCFX 70%, OM1 70%, EF 40-45%.  Echo 11/24/10: EF normal, mild LVH;  s/p CABG with L-LAD, S-OM1/OM2, S-RV marg/RCA.  Marland Kitchen DM2 (diabetes mellitus, type 2)     TYPE II  . Hypertension   . Hyperlipidemia   . Anxiety   . GERD (gastroesophageal reflux disease)   . Back pain, chronic     s/p lumbar fusion  . IBS (irritable bowel syndrome)   . Pleural effusion, left 11/2010    Postop; resolved with p.o. diuresis  . Benign neoplasm of colon 04/01/2000    Hyperplastic  . Obesity   . Myocardial infarction     Past Surgical History  Procedure Laterality Date  . Coronary artery bypass graft  11/26/10    x 6 SURGEON DR. PETER VAN TRIGT  . Lumbar fusion      MULTIPLE; DR. Ellene Route  . Hernia repair    . Rotator cuff repair Bilateral  Family History  Problem Relation Age of Onset  . Hypertension Neg Hx   . Heart disease Neg Hx   . Peripheral vascular disease Neg Hx   . Diabetes Neg Hx    Social History:  reports that he has never smoked. He has never used smokeless tobacco. He reports that he does not drink alcohol or use illicit drugs.  Allergies:  Allergies  Allergen Reactions  . Codeine Other (See Comments)    hyperactive  . Other Nausea And Vomiting    Mayonnaise causes nausea and vomiting  . Statins     Muscle weakness & fatigue    Prior to Admission medications   Medication Sig Start Date End Date Taking? Authorizing Provider  Ascorbic Acid (VITAMIN C) 1000 MG tablet Take 1,000 mg by mouth  daily.   Yes Historical Provider, MD  aspirin EC 81 MG tablet Take 81 mg by mouth daily.    Yes Historical Provider, MD  cholecalciferol (VITAMIN D) 1000 UNITS tablet Take 1,000 Units by mouth daily.   Yes Historical Provider, MD  docusate sodium (COLACE) 100 MG capsule Take 200 mg by mouth at bedtime.     Yes Historical Provider, MD  glyBURIDE-metformin (GLUCOVANCE) 5-500 MG per tablet Take 2 tablets by mouth 2 (two) times daily with a meal. Taking a half a tablet once a day currently   Yes Historical Provider, MD  lisinopril (PRINIVIL,ZESTRIL) 20 MG tablet Take 20 mg by mouth daily.   Yes Historical Provider, MD  methocarbamol (ROBAXIN) 500 MG tablet Take 500 mg by mouth 3 (three) times daily. scheduled   Yes Historical Provider, MD  metoprolol tartrate (LOPRESSOR) 25 MG tablet Take 25 mg by mouth 2 (two) times daily.   Yes Historical Provider, MD  metroNIDAZOLE (METROGEL) 1 % gel Apply 1 application topically daily as needed (rosacea).   Yes Historical Provider, MD  omega-3 acid ethyl esters (LOVAZA) 1 G capsule Take 1 g by mouth 2 (two) times daily.    Yes Historical Provider, MD  polyethylene glycol powder (MIRALAX) powder Take 17 g by mouth at bedtime as needed (constipation).    Yes Historical Provider, MD  pregabalin (LYRICA) 150 MG capsule Take 150 mg by mouth 2 (two) times daily.     Yes Historical Provider, MD  pyridOXINE (VITAMIN B-6) 100 MG tablet Take 100 mg by mouth daily.    Yes Historical Provider, MD  ranitidine (ZANTAC) 300 MG tablet Take 300 mg by mouth daily.   Yes Historical Provider, MD  sertraline (ZOLOFT) 50 MG tablet Take 50 mg by mouth 2 (two) times daily.     Yes Historical Provider, MD  traMADol (ULTRAM) 50 MG tablet Take 50 mg by mouth 3 (three) times daily. scheduled   Yes Historical Provider, MD  zolpidem (AMBIEN) 10 MG tablet Take 10 mg by mouth at bedtime.    Yes Historical Provider, MD     Results for orders placed during the hospital encounter of 07/22/13 (from  the past 48 hour(s))  CBC     Status: Abnormal   Collection Time    07/22/13  2:07 PM      Result Value Ref Range   WBC 8.0  4.0 - 10.5 K/uL   RBC 4.28  4.22 - 5.81 MIL/uL   Hemoglobin 12.6 (*) 13.0 - 17.0 g/dL   HCT 36.7 (*) 39.0 - 52.0 %   MCV 85.7  78.0 - 100.0 fL   MCH 29.4  26.0 - 34.0 pg   MCHC  34.3  30.0 - 36.0 g/dL   RDW 13.6  11.5 - 15.5 %   Platelets 211  150 - 400 K/uL  BASIC METABOLIC PANEL     Status: Abnormal   Collection Time    07/22/13  2:07 PM      Result Value Ref Range   Sodium 137  137 - 147 mEq/L   Potassium 4.7  3.7 - 5.3 mEq/L   Chloride 99  96 - 112 mEq/L   CO2 22  19 - 32 mEq/L   Glucose, Bld 251 (*) 70 - 99 mg/dL   BUN 19  6 - 23 mg/dL   Creatinine, Ser 0.87  0.50 - 1.35 mg/dL   Calcium 9.0  8.4 - 10.5 mg/dL   GFR calc non Af Amer >90  >90 mL/min   GFR calc Af Amer >90  >90 mL/min   Comment: (NOTE)     The eGFR has been calculated using the CKD EPI equation.     This calculation has not been validated in all clinical situations.     eGFR's persistently <90 mL/min signify possible Chronic Kidney     Disease.  Randolm Idol, ED     Status: None   Collection Time    07/22/13  2:13 PM      Result Value Ref Range   Troponin i, poc 0.01  0.00 - 0.08 ng/mL   Comment 3            Comment: Due to the release kinetics of cTnI,     a negative result within the first hours     of the onset of symptoms does not rule out     myocardial infarction with certainty.     If myocardial infarction is still suspected,     repeat the test at appropriate intervals.   Dg Chest 2 View  07/22/2013   CLINICAL DATA:  58 year old male with chest pain.  EXAM: CHEST  2 VIEW  COMPARISON:  12/26/2010 and prior chest radiographs  FINDINGS: Upper limits normal heart size and CABG changes again identified.  There is no evidence of focal airspace disease, pulmonary edema, suspicious pulmonary nodule/mass, pleural effusion, or pneumothorax. No acute bony abnormalities are  identified.  IMPRESSION: No active cardiopulmonary disease.   Electronically Signed   By: Hassan Rowan M.D.   On: 07/22/2013 13:46   Dg Knee Complete 4 Views Left  07/22/2013   CLINICAL DATA:  Fall  EXAM: LEFT KNEE - COMPLETE 4+ VIEW  COMPARISON:  None.  FINDINGS: Generalized osteopenia. No acute fracture or dislocation. No joint effusion. 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 Surgical clips along the medial aspect of the proximal left lower leg. Unremarkable soft tissues.  IMPRESSION: No acute osseous injury of the left knee.   Electronically Signed   By: Kathreen Devoid   On: 07/22/2013 13:46    Review of Systems  Constitutional: Positive for diaphoresis.  Respiratory: Negative for shortness of breath.   Cardiovascular: Positive for chest pain. Negative for palpitations, orthopnea, leg swelling and PND.  Gastrointestinal: Negative for nausea and vomiting.  Musculoskeletal: Positive for back pain (chronic low back pain).  Neurological: Negative for dizziness and loss of consciousness.  All other systems reviewed and are negative.   Blood pressure 155/62, pulse 67, temperature 97.7 F (36.5 C), temperature source Oral, resp. rate 18, height '6\' 1"'  (1.854 m), weight 315 lb (142.883 kg), SpO2 99.00%. Physical Exam  Constitutional: He is oriented to person, place,  and time. He appears well-developed and well-nourished. No distress.  Neck: No JVD present. Carotid bruit is not present.  Cardiovascular: Normal rate, regular rhythm, normal heart sounds and intact distal pulses.  Exam reveals no gallop and no friction rub.   No murmur heard. Pulses:      Radial pulses are 2+ on the right side, and 2+ on the left side.       Dorsalis pedis pulses are 2+ on the right side, and 2+ on the left side.  Respiratory: Effort normal and breath sounds normal.  GI: Soft. Bowel sounds are normal. He exhibits no distension and no mass. There is no tenderness.  Obese    Musculoskeletal: He exhibits no edema.  Neurological: He is alert and oriented to person, place, and time.  Skin: Skin is warm and dry. He is not diaphoretic.  Psychiatric: He has a normal mood and affect. His behavior is normal.     Assessment/Plan Principal Problem:   Angina at rest Active Problems:   Coronary artery disease   Hypertension   Hyperlipidemia   DM2 (diabetes mellitus, type 2)   Morbid obesity  The patient is a 58 year old morbidly obese male with a history of CAD, status post MI and coronary artery bypass grafting x6 in 2012, also with several other cardiac risk factors including hypertension, diabetes and nontreated hyperlipidemia who presents with complaint of intermittent left-sided chest discomfort x2 weeks.  1. Angina at Rest: Currently chest pain-free after dose of sublingual nitroglycerin. EKG is ischemic. Point of care troponin is negative. Given known history of CAD and multiple cardiac risk factors, will recommend admitting for observation and rule out. Will start IV heparin. Will cycle cardiac enzymes x3. Will  make n.p.o. at midnight, and will plan for a diagnostic LHC +/- PCI.   2. CAD:  continue aspirin, beta blocker and ACE inhibitor. Plan for LHC in the am.   3. Hypertension: Moderately elevated with systolic blood pressures in the 150s. Keep chest pain controlled. Will order his home meds which includes  25 mg of Lopressor twice a day and 20 mg of lisinopril. We'll continue to monitor blood pressure as he may need titration of his meds.  4. Type 2 diabetes: Patient states last hemoglobin A1c was checked almost 6 months ago was elevated at 7.6. Serum glucose levels in the ED are elevated in the 200s. Will hold metformin, in anticipation of LHC in the am. Will use sliding scale insulin. Will check hemoglobin A1c.  5: Hyperlipidemia: Patient notes intolerance to statins. His last lipid profile was in January 2013 and notable for an elevated LDL of 114 mg/dL,  low LDL of 30 mg/dL and elevated triglycerides at 197 mg/dL. ? Re-trial of a statin. ? Crestor.  6: Morbid obesity: We will need to address the importance of weight loss.    Lyda Jester, PA-C 07/22/2013, 4:11 PM  Patient was seen in the emergency room with [facility] PA-C.  All aspects of the patient's case was discussed in detail.  I personally reviewed his electrocardiograms which did not show any acute ischemic changes.  His chest pain is somewhat atypical.  It has been occurring at rest.  It does respond promptly to sublingual nitroglycerin.  There has been no radiation of pain to the left arm but he has noted what he describes as an intermittent pulsatile feeling in his left wrist.  The patient has morbid obesity with present weight of 315 pounds.  His physical examination reveals no evidence  of congestive heart failure.  Heart sounds are of good quality and there is no murmur gallop rub or click.  The lungs are clear.  He has good radial and pedal pulses. We will cycle enzymes.  We will plan for left heart cardiac catheterization and possible angioplasty tomorrow.  His previous cardiac catheterization was done by the right radial approach by Dr. Martinique. Trial of a different statin such as Crestor may be indicated at discharge in view of his known coronary artery disease and his diabetes.  He may be able to tolerate alternate day Crestor etc.

## 2013-07-22 NOTE — ED Notes (Signed)
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

## 2013-07-22 NOTE — ED Provider Notes (Signed)
CSN: [id number]     Arrival date & time 07/22/13  1232 History   First MD Initiated Contact with Patient 07/22/13 1244     No chief complaint on file.    (Consider location/radiation/quality/duration/timing/severity/associated sxs/prior Treatment) HPI Comments: 58 year old morbidly obese male with a past medical history of CAD, NSTEMI 11/2010 s/p 6x bipass, type 2 diabetes, hypertension, anxiety, hyperlipidemia, GERD, IBS and chronic back pain presents to the emergency department complaining of intermittent chest tightness x2 weeks. Patient states he has a tightness on the left side of his chest that comes and goes at random. States when he has the chest tightness, he feels a pulsing sensation in his left wrist with associated diaphoresis. Denies shortness of breath, nausea, vomiting, abdominal pain, headache, vision change, dizziness, numbness or weakness. States this does not feel the same as his prior MI back in 2012. At that time he only presented with wrist pain. He saw his cardiologist about one year ago. He has not had a stress test since before his MI which was normal. He has not had any alleviating factors for his symptoms. Patient is also complaining of bilateral knee pain, left worse than right for almost 6 months. Patient states around Christmas time he fell directly onto his knees and since then he has had occasional intermittent pain with walking. States his left knee is slightly swollen. He has not tried any alleviating factors for his symptoms.  The history is provided by the patient and the spouse.    Past Medical History  Diagnosis Date  . Morbidly obese   . Coronary artery disease     NSTEMI 10/12: LHC 11/24/10: mRCA 99% (diffuse), pLAD and Dx bifurcational lesion 60-70%, LAD 70-80% after Dx, dCFX 70%, OM1 70%, EF 40-45%.  Echo 11/24/10: EF normal, mild LVH;  s/p CABG with L-LAD, S-OM1/OM2, S-RV marg/RCA.  Marland Kitchen DM2 (diabetes mellitus, type 2)     TYPE II  . Hypertension   .  Hyperlipidemia   . Anxiety   . GERD (gastroesophageal reflux disease)   . Back pain, chronic     s/p lumbar fusion  . IBS (irritable bowel syndrome)   . Pleural effusion, left 11/2010    Postop; resolved with p.o. diuresis  . Benign neoplasm of colon 04/01/2000    Hyperplastic  . Obesity   . Myocardial infarction    Past Surgical History  Procedure Laterality Date  . Coronary artery bypass graft  11/26/10    x 6 SURGEON DR. PETER VAN TRIGT  . Lumbar fusion      MULTIPLE; DR. Ellene Route  . Hernia repair    . Rotator cuff repair Bilateral    Family History  Problem Relation Age of Onset  . Hypertension Neg Hx   . Heart disease Neg Hx   . Peripheral vascular disease Neg Hx   . Diabetes Neg Hx    History  Substance Use Topics  . Smoking status: Never Smoker   . Smokeless tobacco: Never Used  . Alcohol Use: No    Review of Systems  Constitutional: Positive for diaphoresis.  Cardiovascular: Positive for chest pain.  Musculoskeletal:       Positive for bilateral knee pain.  All other systems reviewed and are negative.     Allergies  Codeine and Statins  Home Medications   Prior to Admission medications   Medication Sig Start Date End Date Taking? Authorizing Provider  ALPRAZolam Duanne Moron) 0.5 MG tablet Take 0.5 mg by mouth every 6 (six) hours as  needed.      Historical Provider, MD  aspirin EC 81 MG tablet Take 81 mg by mouth every other day.     Historical Provider, MD  docusate sodium (COLACE) 100 MG capsule Take 200 mg by mouth at bedtime.      Historical Provider, MD  glyBURIDE-metformin (GLUCOVANCE) 5-500 MG per tablet Take 1 tablet by mouth daily with breakfast. Taking a half a tablet once a day currently    Historical Provider, MD  lisinopril (PRINIVIL,ZESTRIL) 20 MG tablet TAKE ONE TABLET BY MOUTH ONE TIME DAILY 05/18/11   Ivin Poot, MD  methocarbamol (ROBAXIN) 500 MG tablet Take 500 mg by mouth 3 (three) times daily as needed.      Historical Provider, MD   metoprolol tartrate (LOPRESSOR) 25 MG tablet TAKE 1 TABLET BY MOUTH TWICE DAILY 04/17/12   Renella Cunas, MD  omega-3 acid ethyl esters (LOVAZA) 1 G capsule Take 2 g by mouth 2 (two) times daily.      Historical Provider, MD  polyethylene glycol powder (MIRALAX) powder Take 17 g by mouth at bedtime as needed.      Historical Provider, MD  pregabalin (LYRICA) 150 MG capsule Take 150 mg by mouth 2 (two) times daily.      Historical Provider, MD  pyridOXINE (VITAMIN B-6) 100 MG tablet Take 100 mg by mouth daily.     Historical Provider, MD  ranitidine (ZANTAC) 150 MG tablet Take 150 mg by mouth daily.      Historical Provider, MD  sertraline (ZOLOFT) 50 MG tablet Take 50 mg by mouth 2 (two) times daily.      Historical Provider, MD  traMADol (ULTRAM) 50 MG tablet Take 50 mg by mouth every 6 (six) hours as needed.      Historical Provider, MD  vitamin C (ASCORBIC ACID) 500 MG tablet Take 1,000 mg by mouth daily.      Historical Provider, MD  zolpidem (AMBIEN) 10 MG tablet Take 10 mg by mouth at bedtime as needed.      Historical Provider, MD   BP 188/72  Pulse 78  Temp(Src) 97.7 F (36.5 C) (Oral)  Resp 18  Ht 6\' 1"  (1.854 m)  Wt 315 lb (142.883 kg)  BMI 41.57 kg/m2  SpO2 99% Physical Exam  Nursing note and vitals reviewed. Constitutional: He is oriented to person, place, and time. He appears well-developed and well-nourished. No distress.  Morbidly obese.  HENT:  Head: Normocephalic and atraumatic.  Mouth/Throat: Oropharynx is clear and moist.  Eyes: Conjunctivae and EOM are normal. Pupils are equal, round, and reactive to light.  Neck: Normal range of motion. Neck supple. No JVD present.  Cardiovascular: Normal rate, regular rhythm, normal heart sounds and intact distal pulses.   No extremity edema.  Pulmonary/Chest: Effort normal and breath sounds normal. No respiratory distress.  Abdominal: Soft. Bowel sounds are normal. There is no tenderness.  Musculoskeletal: Normal range of  motion.       Right knee: Normal.       Left knee: He exhibits normal range of motion and no deformity.  Left knee- TTP lateral joint line with mild swelling. No erythema or warmth.   Neurological: He is alert and oriented to person, place, and time. He has normal strength. No sensory deficit.  Speech fluent, goal oriented. Moves limbs without ataxia. Equal grip strength bilateral.  Skin: Skin is warm and dry. He is not diaphoretic.  Psychiatric: He has a normal mood and affect. His behavior  is normal.    ED Course  Procedures (including critical care time) Labs Review Labs Reviewed  [place], ED    Imaging Review No results found.   EKG Interpretation   Date/Time:  Thursday July 22 2013 12:38:09 EDT Ventricular Rate:  75 PR Interval:  148 QRS Duration: 102 QT Interval:  406 QTC Calculation: 453 R Axis:   50 Text Interpretation:  Normal sinus rhythm Incomplete right bundle branch  block Borderline ECG No significant change since last tracing Confirmed by  WARD,  DO, KRISTEN ([id number]) on 07/22/2013 12:47:06 PM      MDM   Final diagnoses:  Chest pain at rest  Angina at rest  Coronary artery disease   Patient presenting with 2 weeks of intermittent chest pain and diaphoresis. He is well appearing and in no apparent distress. Afebrile. Hypertensive at 188/72, vitals otherwise stable. Labs pending. EKG without any acute findings. Chest x-ray clear. Given patient's history of NSTEMI with a 6x bypass in 2012, will consult cardiology.  Pt signed out to Coleman, PA-C at shift change. Cardiology consult pending.  Illene Labrador, PA-C 07/26/13 2335

## 2013-07-22 NOTE — ED Notes (Signed)
Pt presents with 2 week h/o chest tightness.  Pt reports pain to L side, has not radiated.  Pt denies any shortness of breath, reports episodes of diaphoresis.

## 2013-07-22 NOTE — ED Notes (Signed)
MD at bedside. 

## 2013-07-22 NOTE — ED Notes (Signed)
Report called to Jessica, RN 3 W 

## 2013-07-23 ENCOUNTER — Encounter (HOSPITAL_COMMUNITY): Payer: Self-pay | Admitting: General Practice

## 2013-07-23 ENCOUNTER — Encounter (HOSPITAL_COMMUNITY)
Admission: EM | Disposition: A | Discharge status: Home / Self Care | Payer: 59 | Source: Home / Self Care | Attending: Cardiology

## 2013-07-23 DIAGNOSIS — I517 Cardiomegaly: Secondary | ICD-10-CM | POA: Diagnosis not present

## 2013-07-23 DIAGNOSIS — I200 Unstable angina: Secondary | ICD-10-CM | POA: Diagnosis present

## 2013-07-23 DIAGNOSIS — I25810 Atherosclerosis of coronary artery bypass graft(s) without angina pectoris: Secondary | ICD-10-CM | POA: Diagnosis not present

## 2013-07-23 DIAGNOSIS — R079 Chest pain, unspecified: Secondary | ICD-10-CM | POA: Diagnosis not present

## 2013-07-23 HISTORY — PX: LEFT HEART CATHETERIZATION WITH CORONARY/GRAFT ANGIOGRAM: SHX5450

## 2013-07-23 HISTORY — PX: CORONARY ANGIOPLASTY WITH STENT PLACEMENT: SHX49

## 2013-07-23 LAB — GLUCOSE, CAPILLARY
Glucose-Capillary: 153 mg/dL — ABNORMAL HIGH (ref 70–99)
Glucose-Capillary: 129 mg/dL — ABNORMAL HIGH (ref 70–99)
Glucose-Capillary: 185 mg/dL — ABNORMAL HIGH (ref 70–99)

## 2013-07-23 LAB — LIPID PANEL
Cholesterol: 181 mg/dL (ref 0–200)
HDL: 26 mg/dL — ABNORMAL LOW (ref >39)
LDL CALC: 96 mg/dL (ref 0–99)
TRIGLYCERIDES: 297 mg/dL — AB (ref <150)
Total CHOL/HDL Ratio: 7 RATIO
VLDL: 59 mg/dL — ABNORMAL HIGH (ref 0–40)

## 2013-07-23 LAB — CBC
HCT: 35.2 % — ABNORMAL LOW (ref 39.0–52.0)
Hemoglobin: 11.8 g/dL — ABNORMAL LOW (ref 13.0–17.0)
MCH: 28.7 pg (ref 26.0–34.0)
MCHC: 33.5 g/dL (ref 30.0–36.0)
MCV: 85.6 fL (ref 78.0–100.0)
Platelets: 194 10*3/uL (ref 150–400)
RBC: 4.11 MIL/uL — ABNORMAL LOW (ref 4.22–5.81)
RDW: 13.8 % (ref 11.5–15.5)
WBC: 6.4 10*3/uL (ref 4.0–10.5)

## 2013-07-23 LAB — PROTIME-INR
INR: 1.08 (ref 0.00–1.49)
PROTHROMBIN TIME: 13.8 s (ref 11.6–15.2)

## 2013-07-23 LAB — TROPONIN I: Troponin I: <0.3 ng/mL (ref <0.30)

## 2013-07-23 LAB — BASIC METABOLIC PANEL
BUN: 18 mg/dL (ref 6–23)
CO2: 24 mEq/L (ref 19–32)
Calcium: 8.9 mg/dL (ref 8.4–10.5)
Chloride: 99 mEq/L (ref 96–112)
Creatinine, Ser: 0.82 mg/dL (ref 0.50–1.35)
Glucose, Bld: 119 mg/dL — ABNORMAL HIGH (ref 70–99)
Potassium: 4 mEq/L (ref 3.7–5.3)
Sodium: 138 mEq/L (ref 137–147)

## 2013-07-23 LAB — HEMOGLOBIN A1C
Hgb A1c MFr Bld: 8.3 % — ABNORMAL HIGH (ref <5.7)
Mean Plasma Glucose: 192 mg/dL — ABNORMAL HIGH (ref <117)

## 2013-07-23 LAB — POCT ACTIVATED CLOTTING TIME: Activated Clotting Time: 326 seconds

## 2013-07-23 LAB — HEPARIN LEVEL (UNFRACTIONATED): Heparin Unfractionated: <0.1 IU/mL — ABNORMAL LOW (ref 0.30–0.70)

## 2013-07-23 SURGERY — LEFT HEART CATHETERIZATION WITH CORONARY/GRAFT ANGIOGRAM
Anesthesia: LOCAL

## 2013-07-23 MED ORDER — HYDRALAZINE HCL 20 MG/ML IJ SOLN
10.0000 mg | Freq: Once | INTRAMUSCULAR | Status: AC
  Administered 2013-07-23: 10 mg via INTRAVENOUS
  Filled 2013-07-23: qty 1

## 2013-07-23 MED ORDER — METOPROLOL TARTRATE 25 MG PO TABS
25.0000 mg | ORAL_TABLET | Freq: Two times a day (BID) | ORAL | Status: DC
  Administered 2013-07-23 – 2013-07-24 (×2): 25 mg via ORAL
  Filled 2013-07-23 (×5): qty 1

## 2013-07-23 MED ORDER — TICAGRELOR 90 MG PO TABS
ORAL_TABLET | ORAL | Status: AC
  Filled 2013-07-23: qty 1

## 2013-07-23 MED ORDER — TICAGRELOR 90 MG PO TABS
90.0000 mg | ORAL_TABLET | Freq: Two times a day (BID) | ORAL | Status: DC
  Administered 2013-07-23 – 2013-07-24 (×2): 90 mg via ORAL
  Filled 2013-07-23 (×4): qty 1

## 2013-07-23 MED ORDER — MIDAZOLAM HCL 2 MG/2ML IJ SOLN
INTRAMUSCULAR | Status: AC
  Filled 2013-07-23: qty 2

## 2013-07-23 MED ORDER — HEPARIN (PORCINE) IN NACL 2-0.9 UNIT/ML-% IJ SOLN
INTRAMUSCULAR | Status: AC
Start: 2013-07-23 — End: 2013-07-23
  Filled 2013-07-23: qty 1500

## 2013-07-23 MED ORDER — SODIUM CHLORIDE 0.9 % IV SOLN
1.0000 mL/kg/h | INTRAVENOUS | Status: AC
  Administered 2013-07-23: 13:00:00 1 mL/kg/h via INTRAVENOUS

## 2013-07-23 MED ORDER — HYDROMORPHONE HCL PF 1 MG/ML IJ SOLN
INTRAMUSCULAR | Status: AC
  Filled 2013-07-23: qty 1

## 2013-07-23 MED ORDER — BIVALIRUDIN 250 MG IV SOLR
INTRAVENOUS | Status: AC
  Filled 2013-07-23: qty 250

## 2013-07-23 MED ORDER — VERAPAMIL HCL 2.5 MG/ML IV SOLN
INTRAVENOUS | Status: AC
  Filled 2013-07-23: qty 2

## 2013-07-23 MED ORDER — LIDOCAINE HCL (PF) 1 % IJ SOLN
INTRAMUSCULAR | Status: AC
  Filled 2013-07-23: qty 30

## 2013-07-23 MED ORDER — HEPARIN BOLUS VIA INFUSION
4000.0000 [IU] | Freq: Once | INTRAVENOUS | Status: AC
  Administered 2013-07-23: 4000 [IU] via INTRAVENOUS
  Filled 2013-07-23: qty 4000

## 2013-07-23 MED ORDER — TRAMADOL HCL 50 MG PO TABS
50.0000 mg | ORAL_TABLET | Freq: Three times a day (TID) | ORAL | Status: DC
  Administered 2013-07-23 – 2013-07-24 (×3): 50 mg via ORAL
  Filled 2013-07-23 (×3): qty 1

## 2013-07-23 MED ORDER — NITROGLYCERIN 0.2 MG/ML ON CALL CATH LAB
INTRAVENOUS | Status: AC
  Filled 2013-07-23: qty 1

## 2013-07-23 MED ORDER — FENTANYL CITRATE 0.05 MG/ML IJ SOLN
INTRAMUSCULAR | Status: AC
  Filled 2013-07-23: qty 2

## 2013-07-23 MED ORDER — PERFLUTREN LIPID MICROSPHERE
1.0000 mL | INTRAVENOUS | Status: AC | PRN
  Administered 2013-07-23: 17:00:00 5 mL via INTRAVENOUS
  Filled 2013-07-23: qty 10

## 2013-07-23 MED ORDER — LISINOPRIL 20 MG PO TABS
20.0000 mg | ORAL_TABLET | Freq: Every day | ORAL | Status: DC
  Administered 2013-07-23: 18:00:00 20 mg via ORAL
  Filled 2013-07-23 (×3): qty 1

## 2013-07-23 NOTE — CV Procedure (Signed)
Cardiac Catheterization Procedure Note  Name: Spencer [name] MRN: [id number] DOB: [birth year]  Procedure: Left Heart Cath, Selective Coronary Angiography, graft angiography, PTCA and stenting of the SVG to the RCA and SVG to the OM2 anastomosis.  Indication: 58 yo WM with history of DM, HTN, HL and prior CABG in 2012 presents with unstable angina.   Procedural Details:  The right wrist was prepped, draped, and anesthetized with 1% lidocaine. Using the modified Seldinger technique, a 6 French slender sheath was introduced into the right radial artery. 3 mg of verapamil was administered through the sheath, weight-based unfractionated heparin was administered intravenously. Standard Judkins catheters were used for selective coronary angiography and left ventriculography. Catheter exchanges were performed over an exchange length guidewire.  PROCEDURAL FINDINGS Hemodynamics: AO 158/82 mean 112 mmHg LV 161/21 mm Hg   Coronary angiography: Coronary dominance: right  Left mainstem: Mild irregularities less than 10%.   Left anterior descending (LAD): 70-80% proximal LAD. Severe 90% disease in the mid LAD with competitive flow from the IMA graft. The first diagonal is severely diseased to 90% with competitive flow from the SVG.  Ramus intermediate: moderate sized vessel with 30% ostial stenosis.   Left circumflex (LCx): 40-50% proximal disease. The mid LCx is severely diseased to 90%. The first OM is severely and diffusely diseased to 90%. The second OM is occluded.  Right coronary artery (RCA): 100% occlusion proximally.   The SVG sequential to the OM 1 and OM 2 is widely patent. At the anastomosis with OM2 there is a focal 90% stenosis.   The LIMA graft to the LAD is a free graft that comes off the hood of the SVG to OM1/OM2. It is widely patent.  The SVG to the diagonal is widely patent.   The SVG to the RV marginal/ RCA is patent but there is a 90% lesion in the proximal SVG.    Left ventriculography: Not done to conserve contrast load.   PCI Note:  Following the diagnostic procedure, the decision was made to proceed with PCI.  Weight-based bivalirudin was given for anticoagulation. Brilinta 180 mg was given orally. Once a therapeutic ACT was achieved, a 6 [place] FR4 guide catheter was inserted into the SVG to RCA.  A filter coronary guidewire was used to cross the lesion and the filter was positioned in the SVG body.  The lesion was predilated with a 2.5 mm balloon.  The lesion was then stented with a 3.0 x 23 mm Xience Alpine stent.  The stent was postdilated with a 3.25 mm noncompliant balloon.  Following PCI, there was 0% residual stenosis and TIMI-3 flow. Final angiography confirmed an excellent result.  We next proceeded with PCI of the SVG to OM2 anastomosis. We attempted to use the FR4 guide but this gave inadequate backup. An AL1 guide was used and still backup was difficult. The lesion was initially crossed with a Prowater wire. The lesion was predilated with a 2.5 mm noncompliant balloon. We were unable to cross with a stent at this point with relatively poor guide support. The lesion was wired with an Asahi medium wire and the lesion was dilated with a 2.5 mm noncompliant balloon to 16 atm. We were then able to cross with a 2.5 x 12 mm Xience Alpine stent and it was deployed. The stent was postdilated with a 2.5 mm noncompliant balloon to 18 atm. Following PCI, there was 0% residual stenosis and TIMI 3 flow. Final angiography confirmed an excellent result.  The patient tolerated the procedure well. There were no immediate procedural complications. A TR band was used for radial hemostasis. The patient was transferred to the post catheterization recovery area for further monitoring.  PCI Data: Vessel - SVG to RV marginal/ RCA /Segment - proximal Percent Stenosis (pre)  90% TIMI-flow 3 Stent 3.0 x 23 mm Xience Alpine Percent Stenosis (post) 0% TIMI-flow (post)  3  Vessel #2- SVG to OM1/OM2 anastomosis with OM2 Percent stenosis (pre) 90% TIMI flow (pre) 3 Stent: 2.5 x 12 mm Xience Alpine stent Percent stenosis (post) 0% TIMI flow post 3  Final Conclusions:   1. Severe 3 vessel obstructive CAD 2. Patent free IMA graft to the LAD 3. Patent sequential graft to OM1/OM2 with high grade anastomotic stenosis at insertion into OM2 4. Patent SVG to the diagonal 5. Patent SVG to RV marginal/ RCA with severe stenosis in proximal SVG 6. Elevated EDP 7. Successful stenting of SVG to RCA with DES 8. Successful stenting of SVG to OM2 with DES  Recommendations:  Continue dual antiplatelet therapy for one year. Will get Echocardiogram to assess LV function. Anticipate DC in am.  Spencer [name] Spencer [name], Spencer [name]  07/23/2013, 9:56 AM

## 2013-07-23 NOTE — Progress Notes (Signed)
UR completed Korbin Mapps K. Divinity Kyler, RN, BSN, MSHL, CCM  07/23/2013 11:01 AM

## 2013-07-23 NOTE — Progress Notes (Signed)
Inpatient Diabetes Program Recommendations  AACE/ADA: New Consensus Statement on Inpatient Glycemic Control (2013)  Target Ranges:  Prepandial:   less than 140 mg/dL      Peak postprandial:   less than 180 mg/dL (1-2 hours)      Critically ill patients:  140 - 180 mg/dL     Results for Spencer [name], Spencer [name] (MRN [id number]) as of 07/23/2013 12:38  Ref. Range 07/22/2013 18:55  Hemoglobin A1C Latest Range: <5.7 % 8.3 (H)     Noted patient's A1c above goal.  Patient sees Dr. Reynaldo Minium with [facility] as an outpatient.  Has follow-up appointment with Dr. Reynaldo Minium in September.  Spoke to patient about his current A1c of 8.3%.  Explained what an A1c is and what it measures.  Reminded patient that his goal A1c is 7% or less per ADA standards to prevent both acute and long-term complications.  Encouraged patient to check his CBGs at home and to record all CBGs in a logbook for his PCP to review.  Patient had several dietary questions for me.  Answered all questions.  Encouraged patient to ask his PCP about adding an additional DM medication to his home regimen.  Also encouraged patient to be careful with his portion sizes of carbohydrates at meal times.   Will follow [name] RN, MSN, CDE Diabetes Coordinator Inpatient Diabetes Program Team Pager: [phone] (8a-10p)

## 2013-07-23 NOTE — Progress Notes (Signed)
TR BAND REMOVAL  LOCATION:    right radial  DEFLATED PER PROTOCOL:    yes  TIME BAND OFF / DRESSING APPLIED:    1400   SITE UPON ARRIVAL:    Level 0  SITE AFTER BAND REMOVAL:    Level 0  REVERSE ALLEN'S TEST:     positive  CIRCULATION SENSATION AND MOVEMENT:    Within Normal Limits   yes  COMMENTS:   Tolerated procedure well

## 2013-07-23 NOTE — Interval H&P Note (Signed)
History and Physical Interval Note:  07/23/2013 7:43 AM  Spencer [name]  has presented today for surgery, with the diagnosis of cp  The various methods of treatment have been discussed with the patient and family. After consideration of risks, benefits and other options for treatment, the patient has consented to  Procedure(s): LEFT HEART CATHETERIZATION WITH CORONARY/GRAFT ANGIOGRAM (N/A) as a surgical intervention .  The patient's history has been reviewed, patient examined, no change in status, stable for surgery.  I have reviewed the patient's chart and labs.  Questions were answered to the patient's satisfaction.   Cath Lab Visit (complete for each Cath Lab visit)  Clinical Evaluation Leading to the Procedure:   ACS: yes  Non-ACS:    Anginal Classification: CCS III  Anti-ischemic medical therapy: Minimal Therapy (1 class of medications)  Non-Invasive Test Results: No non-invasive testing performed  Prior CABG: Previous CABG        Ander Slade [facility] 07/23/2013 7:43 AM

## 2013-07-23 NOTE — Progress Notes (Signed)
ANTICOAGULATION CONSULT NOTE - Initial Consult  Pharmacy Consult for Heparin Indication: chest pain/ACS  Allergies  Allergen Reactions  . Codeine Other (See Comments)    hyperactive  . Other Nausea And Vomiting    Mayonnaise causes nausea and vomiting  . Statins     Muscle weakness & fatigue    Patient Measurements: Height: 6\' 1"  (185.4 cm) Weight: 315 lb (142.883 kg) IBW/kg (Calculated) : 79.9 Heparin Dosing Weight: 112kg  Vital Signs: Temp: 98.1 F (36.7 C) (06/04 2113) Temp src: Oral (06/04 2113) BP: 175/68 mmHg (06/04 2113) Pulse Rate: 63 (06/04 2113)  Labs:  Recent Labs  07/22/13 1407 07/22/13 2228 07/23/13 0155  HGB 12.6*  --  11.8*  HCT 36.7*  --  35.2*  PLT 211  --  194  LABPROT  --   --  13.8  INR  --   --  1.08  HEPARINUNFRC  --   --  <0.10*  CREATININE 0.87  --   --   TROPONINI  --  <0.30  --     Estimated Creatinine Clearance: 140.9 ml/min (by C-G formula based on Cr of 0.87).   Medical History: Past Medical History  Diagnosis Date  . Morbidly obese   . Coronary artery disease     NSTEMI 10/12: LHC 11/24/10: mRCA 99% (diffuse), pLAD and Dx bifurcational lesion 60-70%, LAD 70-80% after Dx, dCFX 70%, OM1 70%, EF 40-45%.  Echo 11/24/10: EF normal, mild LVH;  s/p CABG with L-LAD, S-OM1/OM2, S-RV marg/RCA.  Marland Kitchen DM2 (diabetes mellitus, type 2)     TYPE II  . Hypertension   . Hyperlipidemia   . Anxiety   . GERD (gastroesophageal reflux disease)   . Back pain, chronic     s/p lumbar fusion  . IBS (irritable bowel syndrome)   . Pleural effusion, left 11/2010    Postop; resolved with p.o. diuresis  . Benign neoplasm of colon 04/01/2000    Hyperplastic  . Obesity   . Myocardial infarction     Medications:  Prescriptions prior to admission  Medication Sig Dispense Refill  . Ascorbic Acid (VITAMIN C) 1000 MG tablet Take 1,000 mg by mouth daily.      Marland Kitchen aspirin EC 81 MG tablet Take 81 mg by mouth daily.       . cholecalciferol (VITAMIN D) 1000  UNITS tablet Take 1,000 Units by mouth daily.      Marland Kitchen docusate sodium (COLACE) 100 MG capsule Take 200 mg by mouth at bedtime.        Marland Kitchen glyBURIDE-metformin (GLUCOVANCE) 5-500 MG per tablet Take 2 tablets by mouth 2 (two) times daily with a meal. Taking a half a tablet once a day currently      . lisinopril (PRINIVIL,ZESTRIL) 20 MG tablet Take 20 mg by mouth daily.      . methocarbamol (ROBAXIN) 500 MG tablet Take 500 mg by mouth 3 (three) times daily. scheduled      . metoprolol tartrate (LOPRESSOR) 25 MG tablet Take 25 mg by mouth 2 (two) times daily.      . metroNIDAZOLE (METROGEL) 1 % gel Apply 1 application topically daily as needed (rosacea).      Marland Kitchen omega-3 acid ethyl esters (LOVAZA) 1 G capsule Take 1 g by mouth 2 (two) times daily.       . polyethylene glycol powder (MIRALAX) powder Take 17 g by mouth at bedtime as needed (constipation).       . pregabalin (LYRICA) 150 MG capsule Take 150  mg by mouth 2 (two) times daily.        Marland Kitchen pyridOXINE (VITAMIN B-6) 100 MG tablet Take 100 mg by mouth daily.       . ranitidine (ZANTAC) 300 MG tablet Take 300 mg by mouth daily.      . sertraline (ZOLOFT) 50 MG tablet Take 50 mg by mouth 2 (two) times daily.        . traMADol (ULTRAM) 50 MG tablet Take 50 mg by mouth 3 (three) times daily. scheduled      . zolpidem (AMBIEN) 10 MG tablet Take 10 mg by mouth at bedtime.         Assessment: Heparin level undetectable after 4000 unit bolus and 1550/hr. No infusion related issues. Level drawn appropriately.   Goal of Therapy:  Heparin level 0.3-0.7 units/ml Monitor platelets by anticoagulation protocol: Yes   Plan:  Repeat Heparin 4000 units iv x 1 then increase to 1950 units/hr with 6 hours f/u level    Curlene Dolphin

## 2013-07-23 NOTE — Care Management Note (Addendum)
    Page 1 of 1   07/24/2013     8:10:04 AM CARE MANAGEMENT NOTE 07/24/2013  Patient:  Spencer [name], Spencer [name]   Account Number:  [id number]  Date Initiated:  07/23/2013  Documentation initiated by:  Mariann Laster  Subjective/Objective Assessment:   Admitted with Chest pain and cath     Action/Plan:   CM to follow for disposition needs   Anticipated DC Date:  07/24/2013   Anticipated DC Plan:  [place]  CM consult      Choice offered to / List presented to:             Status of service:  Completed, signed off Medicare Important Message given?   (If response is "NO", the following Medicare IM given date fields will be blank) Date Medicare IM given:   Date Additional Medicare IM given:    Discharge Disposition:  HOME/SELF CARE  Per UR Regulation:  Reviewed for med. necessity/level of care/duration of stay  If discussed at [place] of Stay Meetings, dates discussed:    Comments:  07/24/13 07:50 CM received call from MD requesting arrangement for 4-point cane.  CM called DME delivery rep to deliver 4-point cane to room prior to discharge.  No other CM needs werre communicated.  Mariane Masters, BSN, CM [phone].  Crystal Hutchinson RN, BSN, MSHL, CCM  Nurse - Case Manager, (Unit [phone]  07/22/2013 Procedure: Left Heart Cath, Selective Coronary Angiography, graft angiography, PTCA and stenting of the SVG to the RCA and SVG to the OM2 anastomosis on 07/23/2013 Social:  From home with wife/Debbie Nelida Gores who is also a Marine scientist. Med Review:  ticagrelor (BRILINTA) tablet 90 mg  :  Dose 90 mg  :  Oral  :  2 times daily (Benefits check sent) PER OUTPATIENT PHARM COPAY WILL BE $18 CM provided patient with 30 day free Brilinta card. Disposition Plan:  Home / Self Care Recommendations:  Per MD Note:  Continue dual antiplatelet therapy for one year. Will get Echocardiogram to assess LV function. Anticipate DC in am.

## 2013-07-23 NOTE — Progress Notes (Signed)
  Echocardiogram 2D Echocardiogram with Definity has been performed.  [place] 07/23/2013, 4:41 PM

## 2013-07-24 ENCOUNTER — Encounter (HOSPITAL_COMMUNITY): Payer: Self-pay | Admitting: Nurse Practitioner

## 2013-07-24 DIAGNOSIS — I209 Angina pectoris, unspecified: Secondary | ICD-10-CM

## 2013-07-24 DIAGNOSIS — I2510 Atherosclerotic heart disease of native coronary artery without angina pectoris: Secondary | ICD-10-CM

## 2013-07-24 LAB — CBC
HEMATOCRIT: 35.5 % — AB (ref 39.0–52.0)
Hemoglobin: 11.6 g/dL — ABNORMAL LOW (ref 13.0–17.0)
MCH: 28 pg (ref 26.0–34.0)
MCHC: 32.7 g/dL (ref 30.0–36.0)
MCV: 85.7 fL (ref 78.0–100.0)
Platelets: 186 10*3/uL (ref 150–400)
RBC: 4.14 MIL/uL — ABNORMAL LOW (ref 4.22–5.81)
RDW: 13.7 % (ref 11.5–15.5)
WBC: 6.1 10*3/uL (ref 4.0–10.5)

## 2013-07-24 LAB — GLUCOSE, CAPILLARY: GLUCOSE-CAPILLARY: 197 mg/dL — AB (ref 70–99)

## 2013-07-24 LAB — BASIC METABOLIC PANEL
BUN: 12 mg/dL (ref 6–23)
CHLORIDE: 100 meq/L (ref 96–112)
CO2: 25 mEq/L (ref 19–32)
CREATININE: 0.79 mg/dL (ref 0.50–1.35)
Calcium: 8.9 mg/dL (ref 8.4–10.5)
GFR calc Af Amer: >90 mL/min (ref >90)
GFR calc non Af Amer: >90 mL/min (ref >90)
Glucose, Bld: 185 mg/dL — ABNORMAL HIGH (ref 70–99)
Potassium: 3.9 mEq/L (ref 3.7–5.3)
Sodium: 137 mEq/L (ref 137–147)

## 2013-07-24 MED ORDER — NITROGLYCERIN 0.4 MG SL SUBL: 0.4000 mg | SUBLINGUAL_TABLET | SUBLINGUAL | Status: DC | PRN

## 2013-07-24 MED ORDER — GLYBURIDE-METFORMIN 5-500 MG PO TABS: 2.0000 | ORAL_TABLET | Freq: Two times a day (BID) | ORAL | Status: DC

## 2013-07-24 MED ORDER — TICAGRELOR 90 MG PO TABS: 90.0000 mg | ORAL_TABLET | Freq: Two times a day (BID) | ORAL | Status: DC

## 2013-07-24 MED ORDER — LISINOPRIL 40 MG PO TABS: 40.0000 mg | ORAL_TABLET | Freq: Every day | ORAL | Status: AC

## 2013-07-24 MED ORDER — LISINOPRIL 40 MG PO TABS
40.0000 mg | ORAL_TABLET | Freq: Every day | ORAL | Status: DC
  Administered 2013-07-24: 09:00:00 40 mg via ORAL
  Filled 2013-07-24: qty 1

## 2013-07-24 NOTE — Discharge Summary (Signed)
Discharge Summary   Patient ID: Spencer [name],  MRN: [id number], DOB/AGE: [birth year] 58 y.o.  Admit date: 07/22/2013 Discharge date: 07/24/2013  Primary Care Provider: ARONSON,RICHARD A Primary Cardiologist: Vaughan Browner, MD   Discharge Diagnoses Principal Problem:   Unstable angina  **Status-post PCI and drug-eluting stent placement within the vein graft to the RCA, and at the anastomosis of the vein graft to the obtuse marginal this admission.  Active Problems:   Coronary artery disease   DM2 (diabetes mellitus, type 2)   Morbid obesity   Hypertension   Hyperlipidemia   Allergies Allergies  Allergen Reactions  . Codeine Other (See Comments)    hyperactive  . Other Nausea And Vomiting    Mayonnaise causes nausea and vomiting  . Statins     Muscle weakness & fatigue    Procedures  Cardiac Catheterization and Percutaneous Coronary Intervention 6.5.2015  Hemodynamics: AO 158/82 mean 112 mmHg LV 161/21 mm Hg              Coronary angiography: Coronary dominance: right  Left mainstem: Mild irregularities less than 10%.    Left anterior descending (LAD): 70-80% proximal LAD. Severe 90% disease in the mid LAD with competitive flow from the IMA graft. The first diagonal is severely diseased to 90% with competitive flow from the SVG.  Ramus intermediate: moderate sized vessel with 30% ostial stenosis.    Left circumflex (LCx): 40-50% proximal disease. The mid LCx is severely diseased to 90%. The first OM is severely and diffusely diseased to 90%. The second OM is occluded.  Right coronary artery (RCA): 100% occlusion proximally.   The SVG sequential to the OM 1 and OM 2 is widely patent. At the anastomosis with OM2 there is a focal 90% stenosis.    **Anastamosis successfully stented using a 2.5 x 63mm Xience Alpine DES.**  The LIMA graft to the LAD is a free graft that comes off the hood of the SVG to OM1/OM2. It is widely patent.  The SVG to the diagonal is widely  patent.   The SVG to the RV marginal/ RCA is patent but there is a 90% lesion in the proximal SVG.     **Successfully stented using a 3.0 x 12mm Xience Alpine DES.**  Left ventriculography: Not done to conserve contrast load.  _____________   2D Echocardiogram 6.5.2015  Study Conclusions  - Procedure narrative: Transthoracic echocardiography. Image   quality was suboptimal. The study was technically difficult, as a   result of body habitus. Intravenous contrast (Definity) was   administered. - Left ventricle: The cavity size was normal. Wall thickness was   increased in a pattern of mild LVH. Systolic function was normal.   The estimated ejection fraction was in the range of 55% to 60%.   Definity contrast was given to enhance wall definition, however,   wall motion abnormalities cannot be excluded. No apical thrombus   was identified. The study is not technically sufficient to allow   evaluation of LV diastolic function. - Aortic valve: Trileaflet. No stenosis or regurgitation. - Left atrium: The atrium was normal in size. _____________   History of Present Illness  58 year old male with prior history of coronary artery disease status post non-ST segment elevation myocardial infarction October 2012 with catheterization at that time revealing severe native three-vessel coronary artery disease requiring coronary artery bypass grafting x6. He did well following his bypass surgery until approximately 2 weeks prior to this admission when he began to experience intermittent left-sided chest  discomfort and tightness. Symptoms progressed prompting him to present to the ED on June 4 where ECG was nonacute and troponin was normal. He was admitted for further evaluation.  Hospital Course  Patient ruled out for myocardial infarction. Given progressive symptoms, decision was made to pursue diagnostic catheterization. This was performed on June 5 revealing native multivessel coronary artery  disease with a 90% stenosis within the vein graft to the right coronary artery along with a 90% stenosis at the anastomosis of the vein graft to the obtuse marginal. Both areas were successfully treated using drug-eluting stents as outlined above. Patient tolerated procedure well and post procedure has been ambulating without recurrent chest pain or dyspnea.  During this admission, patient reported chronic bilateral knee pain with worsening of left knee pain recently. Plain films were performed and showed no acute findings. He has been able to ambulate. He plans to followup with orthopedics as an outpatient.  Patient will be discharged home today in good condition. We will arrange for early followup in approximately 2 weeks in our office.  Discharge Vitals Blood pressure 185/147, pulse 79, temperature 97.6 F (36.4 C), temperature source Oral, resp. rate 16, height 6\' 1"  (1.854 m), weight 318 lb 5.5 oz (144.4 kg), SpO2 98.00%.  Filed Weights   07/22/13 1239 07/23/13 0412 07/24/13 0004  Weight: 315 lb (142.883 kg) 317 lb 4.8 oz (143.926 kg) 318 lb 5.5 oz (144.4 kg)    Labs  CBC  Recent Labs  07/23/13 0155 07/24/13 0540  WBC 6.4 6.1  HGB 11.8* 11.6*  HCT 35.2* 35.5*  MCV 85.6 85.7  PLT 194 850   Basic Metabolic Panel  Recent Labs  07/23/13 0155 07/24/13 0540  NA 138 137  K 4.0 3.9  CL 99 100  CO2 24 25  GLUCOSE 119* 185*  BUN 18 12  CREATININE 0.82 0.79  CALCIUM 8.9 8.9   Cardiac Enzymes  Recent Labs  07/22/13 2228 07/23/13 0523 07/23/13 1057  TROPONINI <0.30 <0.30 <0.30   Hemoglobin A1C  Recent Labs  07/22/13 1855  HGBA1C 8.3*   Fasting Lipid Panel  Recent Labs  07/23/13 0155  CHOL 181  HDL 26*  LDLCALC 96  TRIG 297*  CHOLHDL 7.0   Disposition  Pt is being discharged home today in good condition.  Follow-up Plans & Appointments  Follow-up Information   Follow up with ARONSON,RICHARD A, MD. (as scheduled.)    Specialty:  Internal Medicine    Contact information:   [facility] [place] [address] [phone]       Follow up with Darlin Coco, MD In 2 weeks. (We will arrange for follow-up and contact you.)    Specialty:  Cardiology   Contact information:   [address] [place] [address] [phone]       Discharge Medications    Medication List         aspirin EC 81 MG tablet  Take 81 mg by mouth daily.     cholecalciferol 1000 UNITS tablet  Commonly known as:  VITAMIN D  Take 1,000 Units by mouth daily.     docusate sodium 100 MG capsule  Commonly known as:  COLACE  Take 200 mg by mouth at bedtime.     glyBURIDE-metformin 5-500 MG per tablet  Commonly known as:  GLUCOVANCE  Take 2 tablets by mouth 2 (two) times daily with a meal. Taking a half a tablet once a day currently  lisinopril 40 MG tablet  Commonly known as:  PRINIVIL,ZESTRIL  Take 1 tablet (40 mg total) by mouth daily.     methocarbamol 500 MG tablet  Commonly known as:  ROBAXIN  Take 500 mg by mouth 3 (three) times daily. scheduled     metoprolol tartrate 25 MG tablet  Commonly known as:  LOPRESSOR  Take 25 mg by mouth 2 (two) times daily.     metroNIDAZOLE 1 % gel  Commonly known as:  METROGEL  Apply 1 application topically daily as needed (rosacea).     MIRALAX powder  Generic drug:  polyethylene glycol powder  Take 17 g by mouth at bedtime as needed (constipation).     nitroGLYCERIN 0.4 MG SL tablet  Commonly known as:  NITROSTAT  Place 1 tablet (0.4 mg total) under the tongue every 5 (five) minutes as needed for chest pain (CP or SOB).     omega-3 acid ethyl esters 1 G capsule  Commonly known as:  LOVAZA  Take 1 g by mouth 2 (two) times daily.     pregabalin 150 MG capsule  Commonly known as:  LYRICA  Take 150 mg by mouth 2 (two) times daily.     pyridOXINE 100 MG tablet  Commonly known as:  VITAMIN B-6  Take 100 mg by mouth daily.     ranitidine 300  MG tablet  Commonly known as:  ZANTAC  Take 300 mg by mouth daily.     sertraline 50 MG tablet  Commonly known as:  ZOLOFT  Take 50 mg by mouth 2 (two) times daily.     ticagrelor 90 MG Tabs tablet  Commonly known as:  BRILINTA  Take 1 tablet (90 mg total) by mouth 2 (two) times daily.     traMADol 50 MG tablet  Commonly known as:  ULTRAM  Take 50 mg by mouth 3 (three) times daily. scheduled     vitamin C 1000 MG tablet  Take 1,000 mg by mouth daily.     zolpidem 10 MG tablet  Commonly known as:  AMBIEN  Take 10 mg by mouth at bedtime.        Outstanding Labs/Studies  None  Duration of Discharge Encounter   Greater than 30 minutes including physician time.  Signed, Rogelia Mire NP 07/24/2013, 8:17 AM

## 2013-07-24 NOTE — Discharge Instructions (Signed)

## 2013-07-24 NOTE — Progress Notes (Signed)
Patient Name: Spencer [name] Date of Encounter: 07/24/2013   Principal Problem:   Unstable angina Active Problems:   Coronary artery disease   DM2 (diabetes mellitus, type 2)   Morbid obesity   Hypertension   Hyperlipidemia    SUBJECTIVE  No c/p or sob overnight.  Ambulating this morning.  Left knee continues to hurt - chronic since 01/2013 - worse recently.  CURRENT MEDS . aspirin EC  81 mg Oral Daily  . cholecalciferol  1,000 Units Oral Daily  . famotidine  20 mg Oral Daily  . insulin aspart  0-20 Units Subcutaneous TID WC  . lisinopril  20 mg Oral Daily  . methocarbamol  500 mg Oral TID  . metoprolol tartrate  25 mg Oral BID  . omega-3 acid ethyl esters  1 g Oral BID  . pregabalin  150 mg Oral BID  . sertraline  50 mg Oral BID  . ticagrelor  90 mg Oral BID  . traMADol  50 mg Oral TID  . zolpidem  10 mg Oral QHS    OBJECTIVE  Filed Vitals:   07/23/13 2150 07/24/13 0004 07/24/13 0414 07/24/13 0733  BP: 169/41 163/49 159/49 189/86  Pulse:  66 70 79  Temp:  98.4 F (36.9 C) 97.6 F (36.4 C) 97.6 F (36.4 C)  TempSrc:  Oral Oral Oral  Resp:  18 17 16   Height:      Weight:  318 lb 5.5 oz (144.4 kg)    SpO2:  97% 98% 98%    Intake/Output Summary (Last 24 hours) at 07/24/13 0803 Last data filed at 07/23/13 1900  Gross per 24 hour  Intake    720 ml  Output      1 ml  Net    719 ml   Filed Weights   07/22/13 1239 07/23/13 0412 07/24/13 0004  Weight: 315 lb (142.883 kg) 317 lb 4.8 oz (143.926 kg) 318 lb 5.5 oz (144.4 kg)    PHYSICAL EXAM  General: Pleasant, NAD. Neuro: Alert and oriented X 3. Moves all extremities spontaneously. Psych: Normal affect. HEENT:  Normal  Neck: Supple without bruits or JVD. Lungs:  Resp regular and unlabored, CTA. Heart: RRR no s3, s4, or murmurs. Abdomen: Soft, non-tender, non-distended, BS + x 4.  Extremities: No clubbing, cyanosis or edema. DP/PT/Radials 2+ and equal bilaterally.  Left knee is swollen and tender to  touch.  R wrist cath site w/o bleeding/bruit/hematoma.  Accessory Clinical Findings  CBC  Recent Labs  07/23/13 0155 07/24/13 0540  WBC 6.4 6.1  HGB 11.8* 11.6*  HCT 35.2* 35.5*  MCV 85.6 85.7  PLT 194 751   Basic Metabolic Panel  Recent Labs  07/23/13 0155 07/24/13 0540  NA 138 137  K 4.0 3.9  CL 99 100  CO2 24 25  GLUCOSE 119* 185*  BUN 18 12  CREATININE 0.82 0.79  CALCIUM 8.9 8.9   Cardiac Enzymes  Recent Labs  07/22/13 2228 07/23/13 0523 07/23/13 1057  TROPONINI <0.30 <0.30 <0.30   Hemoglobin A1C  Recent Labs  07/22/13 1855  HGBA1C 8.3*   Fasting Lipid Panel  Recent Labs  07/23/13 0155  CHOL 181  HDL 26*  LDLCALC 96  TRIG 297*  CHOLHDL 7.0    TELE  RSR   ECG  Rsr, pvc's no acute st/t changes.  Radiology/Studies  Dg Chest 2 View  07/22/2013   CLINICAL DATA:  58 year old male with chest pain.  EXAM: CHEST  2 VIEW  COMPARISON:  12/26/2010 and prior chest radiographs  FINDINGS: Upper limits normal heart size and CABG changes again identified.  There is no evidence of focal airspace disease, pulmonary edema, suspicious pulmonary nodule/mass, pleural effusion, or pneumothorax. No acute bony abnormalities are identified.  IMPRESSION: No active cardiopulmonary disease.   Electronically Signed   By: Hassan Rowan M.D.   On: 07/22/2013 13:46   Dg Knee Complete 4 Views Left  07/22/2013   CLINICAL DATA:  Fall  EXAM: LEFT KNEE - COMPLETE 4+ VIEW  COMPARISON:  None.  FINDINGS: Generalized osteopenia. No acute fracture or dislocation. No joint effusion. 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 Surgical clips along the medial aspect of the proximal left lower leg. Unremarkable soft tissues.  IMPRESSION: No acute osseous injury of the left knee.   Electronically Signed   By: Kathreen Devoid   On: 07/22/2013 13:46   ASSESSMENT AND PLAN  1.  USA/CAD:  S/p cath revealing severe VG->RCA dzs and anastomotic dzs involving the  VG->OM.  S/P PCI and DES to both areas.  No c/p overnight.  Ambulating w/o Ss (except left knee pain).  Cont asa, brilinta, bb.  Titrate ACEI for elevated BP's.  Intolerant to statins.  2.  HTN:  BP's trending up.  Increase lisinopril to 40mg  daily.  3.  HL:  Intolerant to statins.  LDL 96.  Could consider adding zetia.  4.  DM:  A1c 8.3.  On Glucovance @ home.  Resume in 48hrs.  Needs outpt f/u and adjustment.  Needs weight loss.  5.  L Knee Pain:  Ongoing since December.  X ray this admission w/o acute findings.  Knee is slightly swollen/tender.  Says pain overall better since admission.  He plans to f/u with Dr. Maureen Ralphs.  6.  Morbid Obesity:  Needs cardiac rehab and aggressive lifestyle/diet modification and education.  7. Dispo:  D/C home today.  Prev seen by T. Wall.  Needs f/u in 1-2 wks.  Signed, Rogelia Mire NP

## 2013-07-24 NOTE — Progress Notes (Addendum)
CARDIAC REHAB PHASE I   PRE:  Rate/Rhythm: Sinus 67  BP:    Sitting: 156/50     SaO2: 98% on room air  MODE:  Ambulation: 100 ft   POST:  Rate/Rhythem: Sinus 75  BP:    Sitting: 177/66     SaO2: 98% Room air  [id number] Patient ambulated in hallway using a quad cane a short distance with assistance times 1. Mr Simonetti 's knee has been bothering him. The patient rates his pain a 4/10. Danna Hefty NP and patient's RN notified about elevated blood pressure. Mr Rossano said he missed some of his pain medications yesterday.  Mr Hurrell plans to follow up with an orthopedic doctor concerning his knee. Mr Manseau says he does not want to participate in outpatient cardiac rehab at this time due to his chronic back pain. [facility]. Heart healthy diabetic diet reviewed with the patient. Reviewed signs and symptoms of chest pain, sublingual nitroglycerin and when to call 911. Patient assisted back to recliner ice pack reapplied to his knee. Recheck blood pressure 171/48.   Magda Kiel RN BSN

## 2013-07-24 NOTE — Discharge Summary (Signed)
See my earlier note. He is ready for discharge and will see Dr. Mare Ferrari as OP.

## 2013-07-24 NOTE — Progress Notes (Signed)
He has ambulated with no chest discomfort. Both knees hurt.  He wants to be followed by Dr. Mare Ferrari. He denies med side effects./  Labs are stable.  Plan discharge today.

## 2013-07-26 MED FILL — Sodium Chloride IV Soln 0.9%: INTRAVENOUS | Qty: 50 | Status: AC

## 2013-07-26 NOTE — ED Notes (Signed)
Medical screening examination/treatment/procedure(s) were performed by non-physician practitioner and as supervising physician I was immediately available for consultation/collaboration.   EKG Interpretation   Date/Time:  Thursday July 22 2013 12:38:09 EDT Ventricular Rate:  75 PR Interval:  148 QRS Duration: 102 QT Interval:  406 QTC Calculation: 453 R Axis:   50 Text Interpretation:  Normal sinus rhythm Incomplete right bundle branch  block Borderline ECG No significant change since last tracing Confirmed by  Tenelle Andreason,  DO, Stevie Ertle [phone]) on 07/22/2013 12:47:06 PM        [place], DO 07/26/13 2345

## 2013-08-02 ENCOUNTER — Telehealth: Payer: Self-pay | Admitting: Cardiology

## 2013-08-02 MED ORDER — PRASUGREL HCL 10 MG PO TABS: 10.0000 mg | ORAL_TABLET | Freq: Every day | ORAL | Status: DC

## 2013-08-02 NOTE — Telephone Encounter (Signed)
New Message:  Pt's wife is calling to let us know that the pt has been anxious and SOB.. They believe it may be due to [facility]... They are requesting to speak with a nurse.Marland KitchenMarland Kitchen

## 2013-08-02 NOTE — Telephone Encounter (Signed)
Pt had drug eluding stent 9 days ago - per wife pt has been having SOB intermittently for the past week.  The SOB does not seem to be related to any activity and can occur while sitting and at rest.  It causes him to feel panicky. Advised I will forward information to Dr Mare Ferrari for recommendations and call her back as soon as I hear from him.  She is in agreement.

## 2013-08-02 NOTE — Telephone Encounter (Signed)
The dyspnea is likely from the [place]. Stop Brilinta and switch to Effient 10 mg daily.

## 2013-08-02 NOTE — Telephone Encounter (Signed)
Wife aware and will get the RX so that he can start on it as instructed.  She has a card for a free 30 days so an RX for #30 will be sent in as well as for #90 supply.

## 2013-08-11 DIAGNOSIS — I10 Essential (primary) hypertension: Secondary | ICD-10-CM | POA: Diagnosis not present

## 2013-08-11 DIAGNOSIS — H109 Unspecified conjunctivitis: Secondary | ICD-10-CM | POA: Diagnosis not present

## 2013-08-11 DIAGNOSIS — J019 Acute sinusitis, unspecified: Secondary | ICD-10-CM | POA: Diagnosis not present

## 2013-08-18 ENCOUNTER — Ambulatory Visit (INDEPENDENT_AMBULATORY_CARE_PROVIDER_SITE_OTHER): Payer: 59 | Admitting: Cardiology

## 2013-08-18 ENCOUNTER — Encounter: Payer: Self-pay | Admitting: Cardiology

## 2013-08-18 VITALS — BP 151/81 | HR 68 | Ht 73.0 in | Wt 317.0 lb

## 2013-08-18 DIAGNOSIS — R0989 Other specified symptoms and signs involving the circulatory and respiratory systems: Secondary | ICD-10-CM

## 2013-08-18 DIAGNOSIS — E785 Hyperlipidemia, unspecified: Secondary | ICD-10-CM

## 2013-08-18 DIAGNOSIS — I10 Essential (primary) hypertension: Secondary | ICD-10-CM

## 2013-08-18 DIAGNOSIS — E119 Type 2 diabetes mellitus without complications: Secondary | ICD-10-CM

## 2013-08-18 DIAGNOSIS — I2583 Coronary atherosclerosis due to lipid rich plaque: Principal | ICD-10-CM

## 2013-08-18 DIAGNOSIS — I2510 Atherosclerotic heart disease of native coronary artery without angina pectoris: Secondary | ICD-10-CM

## 2013-08-18 NOTE — Patient Instructions (Signed)
Your physician has requested that you have a carotid duplex. This test is an ultrasound of the carotid arteries in your neck. It looks at blood flow through these arteries that supply the brain with blood. Allow one hour for this exam. There are no restrictions or special instructions.  Your physician recommends that you continue on your current medications as directed. Please refer to the Current Medication list given to you today.  Your physician wants you to follow-up in: 4 month ov/ekg  You will receive a reminder letter in the mail two months in advance. If you don't receive a letter, please call our office to schedule the follow-up appointment.

## 2013-08-18 NOTE — Assessment & Plan Note (Signed)
He has a history of morbid obesity and a history of diabetes.  He is scheduled for a checkup with his PCP soon.  He has not been having a hypoglycemic episodes.

## 2013-08-18 NOTE — Progress Notes (Signed)
Spencer [name] Date of Birth:  Oct 12, 1955 [facility] [address] [place], [place]  [address] [phone]        Fax   [phone]   History of Present Illness: This pleasant 58 year old gentleman is seen by me for the first time in the office.  I had seen him in the emergency room on 07/22/2013 when he was admitted for crescendo angina.  He has a past history of coronary artery disease. He had coronary artery bypass graft surgery in October 2012.  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  He is diabetic.  He is intolerant of statins.  He presented on 07/22/13 with unstable angina.  He underwent cardiac catheterization on 07/23/13 by Dr. Martinique.  He was found to have severe three-vessel obstructive disease.  He had a high-grade anastomotic stenosis at the insertion of the sequential graft to the obtuse marginal 2.  He underwent successful stenting of the saphenous vein graft to the obtuse marginal 2 with a drug-eluting stent.  He was also found to have a 90% lesion in the proximal saphenous vein graft to the right coronary artery.  He underwent successful drug-eluting stenting of that right coronary artery saphenous vein graft.  He was discharged on no antiplatelet therapy.  He was initially sent home on Brilinta but developed severe dyspnea and had to be switched to Effient.  Since then he has had no murmur dyspnea.  He has felt well.  He has been going to the [facility] 5 days a week and swimming in the pool.  Current Outpatient Prescriptions  Medication Sig Dispense Refill  . Ascorbic Acid (VITAMIN C) 1000 MG tablet Take 1,000 mg by mouth daily.      Marland Kitchen aspirin EC 81 MG tablet Take 81 mg by mouth daily.       . cholecalciferol (VITAMIN D) 1000 UNITS tablet Take 1,000 Units by mouth daily.      Marland Kitchen docusate sodium (COLACE) 100 MG capsule Take 200 mg by mouth at bedtime.        Marland Kitchen glyBURIDE-metformin (GLUCOVANCE) 5-500 MG per tablet  Take 2 tablets by mouth 2 (two) times daily with a meal. Taking a half a tablet once a day currently      . lisinopril (PRINIVIL,ZESTRIL) 40 MG tablet Take 1 tablet (40 mg total) by mouth daily.  30 tablet  6  . methocarbamol (ROBAXIN) 500 MG tablet Take 500 mg by mouth 3 (three) times daily. scheduled      . metoprolol tartrate (LOPRESSOR) 25 MG tablet Take 25 mg by mouth 2 (two) times daily.      . metroNIDAZOLE (METROGEL) 1 % gel Apply 1 application topically daily as needed (rosacea).      . nitroGLYCERIN (NITROSTAT) 0.4 MG SL tablet Place 1 tablet (0.4 mg total) under the tongue every 5 (five) minutes as needed for chest pain (CP or SOB).  25 tablet  3  . omega-3 acid ethyl esters (LOVAZA) 1 G capsule Take 1 g by mouth 2 (two) times daily.       . polyethylene glycol powder (MIRALAX) powder Take 17 g by mouth at bedtime as needed (constipation).       . prasugrel (EFFIENT) 10 MG TABS tablet Take 1 tablet (10 mg total) by mouth daily.  30 tablet  0  . pregabalin (LYRICA) 150 MG capsule Take 150 mg by mouth 2 (two) times daily.        Marland Kitchen  pyridOXINE (VITAMIN B-6) 100 MG tablet Take 100 mg by mouth daily.       . ranitidine (ZANTAC) 300 MG tablet Take 300 mg by mouth daily.      . sertraline (ZOLOFT) 50 MG tablet Take 50 mg by mouth 2 (two) times daily.        . traMADol (ULTRAM) 50 MG tablet Take 50 mg by mouth 3 (three) times daily. scheduled      . zolpidem (AMBIEN) 10 MG tablet Take 10 mg by mouth at bedtime.        No current facility-administered medications for this visit.    Allergies  Allergen Reactions  . Codeine Other (See Comments)    hyperactive  . Other Nausea And Vomiting    Mayonnaise causes nausea and vomiting  . Statins     Muscle weakness & fatigue    Patient Active Problem List   Diagnosis Date Noted  . Unstable angina 07/23/2013  . Angina at rest 07/22/2013  . Crescendo angina 07/22/2013  . Right carotid bruit 05/26/2012  . Morbid obesity 04/28/2012  . Hx of  colonic polyps 04/28/2012  . Shortness of breath 12/11/2010  . Coronary artery disease   . Hypertension   . Hyperlipidemia   . DM2 (diabetes mellitus, type 2)   . CONTACT DERMATITIS 04/17/2008    History  Smoking status  . Never Smoker   Smokeless tobacco  . Never Used    History  Alcohol Use No    Family History  Problem Relation Age of Onset  . Hypertension Neg Hx   . Heart disease Neg Hx   . Peripheral vascular disease Neg Hx   . Diabetes Neg Hx     Review of Systems: Constitutional: no fever chills diaphoresis or fatigue or change in weight.  Head and neck: no hearing loss, no epistaxis, no photophobia or visual disturbance. Respiratory: No cough, shortness of breath or wheezing. Cardiovascular: No chest pain peripheral edema, palpitations. Gastrointestinal: No abdominal distention, no abdominal pain, no change in bowel habits hematochezia or melena. Genitourinary: No dysuria, no frequency, no urgency, no nocturia. Musculoskeletal:No arthralgias, no back pain, no gait disturbance or myalgias. Neurological: No dizziness, no headaches, no numbness, no seizures, no syncope, no weakness, no tremors. Hematologic: No lymphadenopathy, no easy bruising. Psychiatric: No confusion, no hallucinations, no sleep disturbance.    Physical Exam: Filed Vitals:   08/18/13 1055  BP: 151/81  Pulse: 68   the general appearance reveals a large gentleman in no distress.The head and neck exam reveals pupils equal and reactive.  Extraocular movements are full.  There is no scleral icterus.  The mouth and pharynx are normal.  The neck is supple.  The carotids reveal right carotid bruit.  The jugular venous pressure is normal.  The  thyroid is not enlarged.  There is no lymphadenopathy.  The chest is clear to percussion and auscultation.  There are no rales or rhonchi.  Expansion of the chest is symmetrical.  The precordium is quiet.  The first heart sound is normal.  The second heart sound  is physiologically split.  There is no murmur gallop rub or click.  There is no abnormal lift or heave.  The abdomen is soft and nontender.  The bowel sounds are normal.  The liver and spleen are not enlarged.  There are no abdominal masses.  There are no abdominal bruits.  Extremities reveal good pedal pulses.  There is no phlebitis or edema.  There is no cyanosis  or clubbing.  Strength is normal and symmetrical in all extremities.  There is no lateralizing weakness.  There are no sensory deficits.  The skin is warm and dry.  There is no rash.     Assessment / Plan: 1. ischemic heart disease status post CABG in 2012 and acute coronary syndrome in June 2015, treated with drug-eluting stent to saphenous vein graft to obtuse marginal 2 and drug-eluting stent to saphenous vein graft to right coronary artery. 2. hyperlipidemia with statin intolerance 3. essential hypertension 4. morbid obesity 5. carotid artery disease with right carotid bruit 6. diabetes mellitus  Plan: Continue same medication.  He is overdue for his followup carotid duplex which we will order. Recheck in 4 months for office visit and EKG

## 2013-08-18 NOTE — Assessment & Plan Note (Addendum)
No further angina pectoris since he had successful stenting of saphenous vein graft to the obtuse marginal 2 and stenting of the saphenous vein graft to the right coronary artery, both on 07/23/13.  He developed dyspnea on Brilinta but is tolerating Effient.

## 2013-08-18 NOTE — Assessment & Plan Note (Signed)
The patient has lost several pounds since we last saw him.  He is going to make a concentrated effort to lose more weight

## 2013-08-23 ENCOUNTER — Ambulatory Visit (HOSPITAL_COMMUNITY): Payer: 59 | Attending: Cardiovascular Disease | Admitting: Cardiology

## 2013-08-23 DIAGNOSIS — I6529 Occlusion and stenosis of unspecified carotid artery: Secondary | ICD-10-CM | POA: Insufficient documentation

## 2013-08-23 DIAGNOSIS — R0989 Other specified symptoms and signs involving the circulatory and respiratory systems: Secondary | ICD-10-CM

## 2013-08-23 NOTE — Progress Notes (Signed)
Carotid duplex performed 

## 2013-11-08 ENCOUNTER — Encounter: Payer: Self-pay | Admitting: Internal Medicine

## 2014-01-27 ENCOUNTER — Encounter (HOSPITAL_COMMUNITY): Payer: Self-pay | Admitting: Cardiology

## 2014-02-23 ENCOUNTER — Other Ambulatory Visit: Payer: Self-pay

## 2014-02-23 ENCOUNTER — Other Ambulatory Visit (HOSPITAL_COMMUNITY): Payer: Self-pay | Admitting: Cardiology

## 2014-02-23 ENCOUNTER — Telehealth: Payer: Self-pay | Admitting: Internal Medicine

## 2014-02-23 DIAGNOSIS — I6529 Occlusion and stenosis of unspecified carotid artery: Secondary | ICD-10-CM

## 2014-02-23 DIAGNOSIS — Z1211 Encounter for screening for malignant neoplasm of colon: Secondary | ICD-10-CM

## 2014-02-23 NOTE — Telephone Encounter (Signed)
Pt states he is due for colon and that Dr. Henrene Pastor told him it had to be done at the hospital. When would you like to try and schedule him at the hospital? Please advise.

## 2014-02-23 NOTE — Telephone Encounter (Signed)
Can't do this month... Let's look toward my hospital week in March or the one after. He will need to day preparation. 2 days of clear liquids. One bottle of magnesium citrate the morning prior to the procedure to be followed by standard Movi prep. Also, will need MAC/propofol. Finally, requests the ENTEROSCOPE instead a standard colonoscope (its longer). Thanks

## 2014-02-24 NOTE — Telephone Encounter (Signed)
Given that, he should consult with his cardiologist after he has been on Effient for 1 year. He can contact our office thereafter and we will go from there. Thank you

## 2014-02-24 NOTE — Telephone Encounter (Signed)
Called pt to schedule his colon and he does not want to schedule it yet if he will have to come off of Effient. Pt had 2 stents placed and has not been on the Effient a year yet. Please advise.

## 2014-02-24 NOTE — Telephone Encounter (Signed)
Spoke with pt and he is aware. 

## 2014-02-28 ENCOUNTER — Ambulatory Visit (HOSPITAL_COMMUNITY): Payer: 59 | Attending: Cardiology | Admitting: *Deleted

## 2014-02-28 DIAGNOSIS — I6523 Occlusion and stenosis of bilateral carotid arteries: Secondary | ICD-10-CM | POA: Insufficient documentation

## 2014-02-28 DIAGNOSIS — E785 Hyperlipidemia, unspecified: Secondary | ICD-10-CM | POA: Diagnosis not present

## 2014-02-28 DIAGNOSIS — E119 Type 2 diabetes mellitus without complications: Secondary | ICD-10-CM | POA: Diagnosis not present

## 2014-02-28 DIAGNOSIS — Z951 Presence of aortocoronary bypass graft: Secondary | ICD-10-CM | POA: Diagnosis not present

## 2014-02-28 DIAGNOSIS — I6529 Occlusion and stenosis of unspecified carotid artery: Secondary | ICD-10-CM | POA: Diagnosis not present

## 2014-02-28 DIAGNOSIS — I2510 Atherosclerotic heart disease of native coronary artery without angina pectoris: Secondary | ICD-10-CM | POA: Diagnosis not present

## 2014-02-28 NOTE — Progress Notes (Signed)
Carotid Duplex Performed 

## 2014-03-24 DIAGNOSIS — I10 Essential (primary) hypertension: Secondary | ICD-10-CM | POA: Diagnosis not present

## 2014-03-24 DIAGNOSIS — M542 Cervicalgia: Secondary | ICD-10-CM | POA: Diagnosis not present

## 2014-03-24 DIAGNOSIS — G8929 Other chronic pain: Secondary | ICD-10-CM | POA: Diagnosis not present

## 2014-03-24 DIAGNOSIS — Z6841 Body Mass Index (BMI) 40.0 and over, adult: Secondary | ICD-10-CM | POA: Diagnosis not present

## 2014-03-24 DIAGNOSIS — M545 Low back pain: Secondary | ICD-10-CM | POA: Diagnosis not present

## 2014-05-03 ENCOUNTER — Ambulatory Visit (HOSPITAL_COMMUNITY): Admit: 2014-05-03 | Payer: Self-pay | Admitting: Internal Medicine

## 2014-05-03 ENCOUNTER — Encounter (HOSPITAL_COMMUNITY): Payer: Self-pay

## 2014-05-03 SURGERY — COLONOSCOPY
Anesthesia: Monitor Anesthesia Care

## 2014-05-12 DIAGNOSIS — M1712 Unilateral primary osteoarthritis, left knee: Secondary | ICD-10-CM | POA: Diagnosis not present

## 2014-05-12 DIAGNOSIS — M1711 Unilateral primary osteoarthritis, right knee: Secondary | ICD-10-CM | POA: Diagnosis not present

## 2014-08-09 ENCOUNTER — Other Ambulatory Visit: Payer: Self-pay | Admitting: Cardiology

## 2014-12-21 DIAGNOSIS — M1712 Unilateral primary osteoarthritis, left knee: Secondary | ICD-10-CM | POA: Diagnosis not present

## 2015-01-18 DIAGNOSIS — M1612 Unilateral primary osteoarthritis, left hip: Secondary | ICD-10-CM | POA: Diagnosis not present

## 2015-01-18 DIAGNOSIS — M1712 Unilateral primary osteoarthritis, left knee: Secondary | ICD-10-CM | POA: Diagnosis not present

## 2015-01-26 DIAGNOSIS — M1712 Unilateral primary osteoarthritis, left knee: Secondary | ICD-10-CM | POA: Diagnosis not present

## 2015-02-01 DIAGNOSIS — M1712 Unilateral primary osteoarthritis, left knee: Secondary | ICD-10-CM | POA: Diagnosis not present

## 2015-02-01 DIAGNOSIS — M1612 Unilateral primary osteoarthritis, left hip: Secondary | ICD-10-CM | POA: Diagnosis not present

## 2015-02-28 DIAGNOSIS — R350 Frequency of micturition: Secondary | ICD-10-CM | POA: Diagnosis not present

## 2015-02-28 DIAGNOSIS — Z6841 Body Mass Index (BMI) 40.0 and over, adult: Secondary | ICD-10-CM | POA: Diagnosis not present

## 2015-03-02 DIAGNOSIS — R300 Dysuria: Secondary | ICD-10-CM | POA: Diagnosis not present

## 2015-03-02 DIAGNOSIS — N390 Urinary tract infection, site not specified: Secondary | ICD-10-CM | POA: Diagnosis not present

## 2015-03-02 DIAGNOSIS — R350 Frequency of micturition: Secondary | ICD-10-CM | POA: Diagnosis not present

## 2015-03-13 DIAGNOSIS — R350 Frequency of micturition: Secondary | ICD-10-CM | POA: Diagnosis not present

## 2015-03-13 DIAGNOSIS — R3912 Poor urinary stream: Secondary | ICD-10-CM | POA: Diagnosis not present

## 2015-03-13 DIAGNOSIS — R310 Gross hematuria: Secondary | ICD-10-CM | POA: Diagnosis not present

## 2015-03-13 DIAGNOSIS — Z0000 Encounter for general adult medical examination without abnormal findings: Secondary | ICD-10-CM | POA: Diagnosis not present

## 2015-03-13 MED FILL — SERTRALINE HCL 50 MG TABLET: 50 | 90 days supply | Qty: 180 | Fill #0

## 2015-03-13 MED FILL — raNITIdine HCL 300 MG TABS: 300 | 90 days supply | Qty: 90 | Fill #2

## 2015-03-13 MED FILL — ZOLPIDEM TARTRATE 10 MG TAB: 10 | 90 days supply | Qty: 90 | Fill #0

## 2015-03-13 MED FILL — METHOCARBAMOL 500 MG TABLET: 500 | 90 days supply | Qty: 270 | Fill #1

## 2015-03-13 MED FILL — traMADol HCL 50 MG TABS: 50 | 90 days supply | Qty: 720 | Fill #2

## 2015-03-13 MED FILL — METFORMIN HCL ER 500 MG TAB: 500 | 90 days supply | Qty: 360 | Fill #3

## 2015-03-13 MED FILL — METOPROLOL SUCC ER 25 MG TA: 25 | 90 days supply | Qty: 180 | Fill #2

## 2015-03-13 MED FILL — LISINOPRIL 40 MG TABLET: 40 | 90 days supply | Qty: 90 | Fill #2

## 2015-03-13 MED FILL — OMEGA-3 ETHYL ESTERS 1 GM C: 1 | 90 days supply | Qty: 360 | Fill #2

## 2015-03-13 MED FILL — LYRICA 150 MG CAPSULE: 150 | 90 days supply | Qty: 180 | Fill #1

## 2015-03-15 DIAGNOSIS — Z6841 Body Mass Index (BMI) 40.0 and over, adult: Secondary | ICD-10-CM | POA: Diagnosis not present

## 2015-03-15 DIAGNOSIS — E1140 Type 2 diabetes mellitus with diabetic neuropathy, unspecified: Secondary | ICD-10-CM | POA: Diagnosis not present

## 2015-03-15 DIAGNOSIS — I10 Essential (primary) hypertension: Secondary | ICD-10-CM | POA: Diagnosis not present

## 2015-03-16 DIAGNOSIS — M1712 Unilateral primary osteoarthritis, left knee: Secondary | ICD-10-CM | POA: Diagnosis not present

## 2015-03-17 DIAGNOSIS — N200 Calculus of kidney: Secondary | ICD-10-CM | POA: Diagnosis not present

## 2015-03-17 DIAGNOSIS — R310 Gross hematuria: Secondary | ICD-10-CM | POA: Diagnosis not present

## 2015-03-29 DIAGNOSIS — Z0000 Encounter for general adult medical examination without abnormal findings: Secondary | ICD-10-CM | POA: Diagnosis not present

## 2015-03-29 DIAGNOSIS — R310 Gross hematuria: Secondary | ICD-10-CM | POA: Diagnosis not present

## 2015-03-29 DIAGNOSIS — N358 Other urethral stricture: Secondary | ICD-10-CM | POA: Diagnosis not present

## 2015-03-29 DIAGNOSIS — N200 Calculus of kidney: Secondary | ICD-10-CM | POA: Diagnosis not present

## 2015-03-29 MED FILL — SULFAMETHOXAZOLE/TMP DS TAB: 800-160 | 7 days supply | Qty: 7 | Fill #0

## 2015-04-19 DIAGNOSIS — Z0000 Encounter for general adult medical examination without abnormal findings: Secondary | ICD-10-CM | POA: Diagnosis not present

## 2015-04-19 DIAGNOSIS — R3912 Poor urinary stream: Secondary | ICD-10-CM | POA: Diagnosis not present

## 2015-05-03 DIAGNOSIS — I10 Essential (primary) hypertension: Secondary | ICD-10-CM | POA: Diagnosis not present

## 2015-05-03 DIAGNOSIS — I200 Unstable angina: Secondary | ICD-10-CM | POA: Diagnosis not present

## 2015-05-03 DIAGNOSIS — R350 Frequency of micturition: Secondary | ICD-10-CM | POA: Diagnosis not present

## 2015-05-03 DIAGNOSIS — G629 Polyneuropathy, unspecified: Secondary | ICD-10-CM | POA: Diagnosis not present

## 2015-05-03 DIAGNOSIS — Z0000 Encounter for general adult medical examination without abnormal findings: Secondary | ICD-10-CM | POA: Diagnosis not present

## 2015-05-03 DIAGNOSIS — F1320 Sedative, hypnotic or anxiolytic dependence, uncomplicated: Secondary | ICD-10-CM | POA: Diagnosis not present

## 2015-05-03 DIAGNOSIS — E1140 Type 2 diabetes mellitus with diabetic neuropathy, unspecified: Secondary | ICD-10-CM | POA: Diagnosis not present

## 2015-05-03 DIAGNOSIS — I2510 Atherosclerotic heart disease of native coronary artery without angina pectoris: Secondary | ICD-10-CM | POA: Diagnosis not present

## 2015-05-03 MED FILL — VICTOZA 18 MG/3 ML INJECT P: 18 | 90 days supply | Qty: 27 | Fill #4

## 2015-05-03 MED FILL — EFFIENT 10 MG TABLET: 10 | 90 days supply | Qty: 90 | Fill #3

## 2015-05-03 MED FILL — NOVOFINE 32G NEEDLES: 32G X 6 MM | 90 days supply | Qty: 100 | Fill #4

## 2015-05-04 MED FILL — FREESTYLE LITE TEST STRIP: 90 days supply | Qty: 200 | Fill #0

## 2015-05-08 MED FILL — TRIAMCINOLONE 0.1% CREAM: 0.1 | 30 days supply | Qty: 80 | Fill #0

## 2015-05-09 DIAGNOSIS — Z1212 Encounter for screening for malignant neoplasm of rectum: Secondary | ICD-10-CM | POA: Diagnosis not present

## 2015-06-12 MED FILL — OMEGA-3 ETHYL ESTERS 1 GM C: 1 | 90 days supply | Qty: 360 | Fill #0

## 2015-06-12 MED FILL — LYRICA 150 MG CAPSULE: 150 | 90 days supply | Qty: 180 | Fill #0

## 2015-06-12 MED FILL — LISINOPRIL 40 MG TABLET: 40 | 90 days supply | Qty: 90 | Fill #3

## 2015-06-12 MED FILL — raNITIdine HCL 300 MG TABS: 300 | 90 days supply | Qty: 90 | Fill #3

## 2015-06-12 MED FILL — METFORMIN HCL ER 500 MG TAB: 500 | 90 days supply | Qty: 360 | Fill #4

## 2015-06-12 MED FILL — traMADol HCL 50 MG TABS: 50 | 90 days supply | Qty: 720 | Fill #0

## 2015-06-12 MED FILL — SERTRALINE HCL 50 MG TABLET: 50 | 90 days supply | Qty: 180 | Fill #1

## 2015-06-12 MED FILL — ZOLPIDEM TARTRATE 10 MG TAB: 10 | 90 days supply | Qty: 90 | Fill #1

## 2015-06-12 MED FILL — METOPROLOL SUCC ER 25 MG TA: 25 | 90 days supply | Qty: 180 | Fill #3

## 2015-06-12 MED FILL — METHOCARBAMOL 500 MG TABLET: 500 | 90 days supply | Qty: 270 | Fill #0

## 2015-06-14 DIAGNOSIS — E1140 Type 2 diabetes mellitus with diabetic neuropathy, unspecified: Secondary | ICD-10-CM | POA: Diagnosis not present

## 2015-06-14 DIAGNOSIS — Z6841 Body Mass Index (BMI) 40.0 and over, adult: Secondary | ICD-10-CM | POA: Diagnosis not present

## 2015-06-14 DIAGNOSIS — I10 Essential (primary) hypertension: Secondary | ICD-10-CM | POA: Diagnosis not present

## 2015-07-20 DIAGNOSIS — N358 Other urethral stricture: Secondary | ICD-10-CM | POA: Diagnosis not present

## 2015-07-20 DIAGNOSIS — R3912 Poor urinary stream: Secondary | ICD-10-CM | POA: Diagnosis not present

## 2015-08-07 MED FILL — FREESTYLE LITE TEST STRIP: 90 days supply | Qty: 200 | Fill #1

## 2015-08-08 MED FILL — EFFIENT 10 MG TABLET: 10 | 90 days supply | Qty: 90 | Fill #0

## 2015-08-08 MED FILL — VICTOZA 18 MG/3 ML INJECT P: 18 | 90 days supply | Qty: 27 | Fill #0

## 2015-08-09 MED FILL — NOVOFINE 32G NEEDLES: 32G X 6 MM | 90 days supply | Qty: 100 | Fill #0

## 2015-08-12 ENCOUNTER — Emergency Department (HOSPITAL_BASED_OUTPATIENT_CLINIC_OR_DEPARTMENT_OTHER): Payer: 59

## 2015-08-12 ENCOUNTER — Emergency Department (HOSPITAL_BASED_OUTPATIENT_CLINIC_OR_DEPARTMENT_OTHER)
Admission: EM | Admit: 2015-08-12 | Discharge: 2015-08-12 | Disposition: A | Discharge status: Home / Self Care | Payer: 59 | Attending: Emergency Medicine | Admitting: Emergency Medicine

## 2015-08-12 ENCOUNTER — Encounter (HOSPITAL_BASED_OUTPATIENT_CLINIC_OR_DEPARTMENT_OTHER): Payer: Self-pay | Admitting: *Deleted

## 2015-08-12 DIAGNOSIS — Z7984 Long term (current) use of oral hypoglycemic drugs: Secondary | ICD-10-CM | POA: Insufficient documentation

## 2015-08-12 DIAGNOSIS — E785 Hyperlipidemia, unspecified: Secondary | ICD-10-CM | POA: Insufficient documentation

## 2015-08-12 DIAGNOSIS — Z79899 Other long term (current) drug therapy: Secondary | ICD-10-CM | POA: Insufficient documentation

## 2015-08-12 DIAGNOSIS — K859 Acute pancreatitis without necrosis or infection, unspecified: Secondary | ICD-10-CM | POA: Insufficient documentation

## 2015-08-12 DIAGNOSIS — R1013 Epigastric pain: Secondary | ICD-10-CM | POA: Diagnosis not present

## 2015-08-12 DIAGNOSIS — R1011 Right upper quadrant pain: Secondary | ICD-10-CM | POA: Diagnosis not present

## 2015-08-12 DIAGNOSIS — Z7982 Long term (current) use of aspirin: Secondary | ICD-10-CM | POA: Insufficient documentation

## 2015-08-12 DIAGNOSIS — I252 Old myocardial infarction: Secondary | ICD-10-CM | POA: Insufficient documentation

## 2015-08-12 DIAGNOSIS — E119 Type 2 diabetes mellitus without complications: Secondary | ICD-10-CM | POA: Insufficient documentation

## 2015-08-12 DIAGNOSIS — K760 Fatty (change of) liver, not elsewhere classified: Secondary | ICD-10-CM | POA: Diagnosis not present

## 2015-08-12 DIAGNOSIS — R1012 Left upper quadrant pain: Secondary | ICD-10-CM | POA: Diagnosis present

## 2015-08-12 DIAGNOSIS — I10 Essential (primary) hypertension: Secondary | ICD-10-CM | POA: Diagnosis not present

## 2015-08-12 DIAGNOSIS — I2510 Atherosclerotic heart disease of native coronary artery without angina pectoris: Secondary | ICD-10-CM | POA: Insufficient documentation

## 2015-08-12 DIAGNOSIS — M1990 Unspecified osteoarthritis, unspecified site: Secondary | ICD-10-CM | POA: Insufficient documentation

## 2015-08-12 DIAGNOSIS — R1030 Lower abdominal pain, unspecified: Secondary | ICD-10-CM | POA: Diagnosis not present

## 2015-08-12 LAB — URINALYSIS, ROUTINE W REFLEX MICROSCOPIC
Bilirubin Urine: NEGATIVE
GLUCOSE, UA: NEGATIVE mg/dL
Hgb urine dipstick: NEGATIVE
KETONES UR: NEGATIVE mg/dL
LEUKOCYTES UA: NEGATIVE
NITRITE: NEGATIVE
PH: 7 (ref 5.0–8.0)
Protein, ur: NEGATIVE mg/dL
SPECIFIC GRAVITY, URINE: 1.012 (ref 1.005–1.030)

## 2015-08-12 LAB — COMPREHENSIVE METABOLIC PANEL
ALBUMIN: 4.1 g/dL (ref 3.5–5.0)
ALT: 25 U/L (ref 17–63)
ANION GAP: 12 (ref 5–15)
AST: 24 U/L (ref 15–41)
Alkaline Phosphatase: 66 U/L (ref 38–126)
BUN: 15 mg/dL (ref 6–20)
CHLORIDE: 98 mmol/L — AB (ref 101–111)
CO2: 24 mmol/L (ref 22–32)
Calcium: 9.5 mg/dL (ref 8.9–10.3)
Creatinine, Ser: 0.76 mg/dL (ref 0.61–1.24)
GFR calc Af Amer: >60 mL/min (ref >60)
GFR calc non Af Amer: >60 mL/min (ref >60)
GLUCOSE: 159 mg/dL — AB (ref 65–99)
Potassium: 4.3 mmol/L (ref 3.5–5.1)
Sodium: 134 mmol/L — ABNORMAL LOW (ref 135–145)
Total Bilirubin: 0.8 mg/dL (ref 0.3–1.2)
Total Protein: 8.4 g/dL — ABNORMAL HIGH (ref 6.5–8.1)

## 2015-08-12 LAB — I-STAT CG4 LACTIC ACID, ED: LACTIC ACID, VENOUS: 1.9 mmol/L (ref 0.5–2.0)

## 2015-08-12 LAB — CBC
HEMATOCRIT: 41.4 % (ref 39.0–52.0)
HEMOGLOBIN: 14.1 g/dL (ref 13.0–17.0)
MCH: 29.1 pg (ref 26.0–34.0)
MCHC: 34.1 g/dL (ref 30.0–36.0)
MCV: 85.5 fL (ref 78.0–100.0)
Platelets: 261 10*3/uL (ref 150–400)
RBC: 4.84 MIL/uL (ref 4.22–5.81)
RDW: 13.4 % (ref 11.5–15.5)
WBC: 7.5 10*3/uL (ref 4.0–10.5)

## 2015-08-12 LAB — TROPONIN I
TROPONIN I: 0.03 ng/mL (ref <0.031)
Troponin I: 0.03 ng/mL (ref <0.031)

## 2015-08-12 LAB — LIPASE, BLOOD: LIPASE: 163 U/L — AB (ref 11–51)

## 2015-08-12 MED ORDER — SODIUM CHLORIDE 0.9 % IV BOLUS (SEPSIS)
1000.0000 mL | Freq: Once | INTRAVENOUS | Status: AC
  Administered 2015-08-12: 1000 mL via INTRAVENOUS

## 2015-08-12 MED ORDER — HYDROCODONE-ACETAMINOPHEN 5-325 MG PO TABS: 1.0000 | ORAL_TABLET | ORAL | Status: DC | PRN

## 2015-08-12 MED ORDER — IOPAMIDOL (ISOVUE-300) INJECTION 61%
100.0000 mL | Freq: Once | INTRAVENOUS | Status: AC | PRN
  Administered 2015-08-12: 100 mL via INTRAVENOUS

## 2015-08-12 MED ORDER — GI COCKTAIL ~~LOC~~
30.0000 mL | Freq: Once | ORAL | Status: AC
  Administered 2015-08-12: 30 mL via ORAL
  Filled 2015-08-12: qty 30

## 2015-08-12 MED ORDER — ONDANSETRON HCL 4 MG PO TABS: 4.0000 mg | ORAL_TABLET | Freq: Four times a day (QID) | ORAL | Status: DC

## 2015-08-12 MED ORDER — ONDANSETRON HCL 4 MG/2ML IJ SOLN
4.0000 mg | Freq: Once | INTRAMUSCULAR | Status: AC
  Administered 2015-08-12: 4 mg via INTRAVENOUS
  Filled 2015-08-12: qty 2

## 2015-08-12 NOTE — ED Notes (Signed)
Pt here with stomach pain.  Pain x3-4 years in epigastric area.  Pt is on victoza and they are concerned that this could be a side effect.  Pt denies any CP or sob with this but has hx of MI and CABG

## 2015-08-12 NOTE — ED Notes (Signed)
Given diet soda to drink.

## 2015-08-12 NOTE — ED Notes (Signed)
Patient transported to CT 

## 2015-08-12 NOTE — ED Notes (Signed)
MD at bedside. 

## 2015-08-12 NOTE — Discharge Instructions (Signed)
Acute Pancreatitis Stop taking victoza. Followup with Dr. Reynaldo Minium. Keep yourself hydrated. Return to the ED if you develop worsening pain, vomiting, or any other concerns. Acute pancreatitis is a disease in which the pancreas becomes suddenly inflamed. The pancreas is a large gland located behind your stomach. The pancreas produces enzymes that help digest food. The pancreas also releases the hormones glucagon and insulin that help regulate blood sugar. Damage to the pancreas occurs when the digestive enzymes from the pancreas are activated and begin attacking the pancreas before being released into the intestine. Most acute attacks last a couple of days and can cause serious complications. Some people become dehydrated and develop low blood pressure. In severe cases, bleeding into the pancreas can lead to shock and can be life-threatening. The lungs, heart, and kidneys may fail. CAUSES  Pancreatitis can happen to anyone. In some cases, the cause is unknown. Most cases are caused by:  Alcohol abuse.  Gallstones. Other less common causes are:  Certain medicines.  Exposure to certain chemicals.  Infection.  Damage caused by an accident (trauma).  Abdominal surgery. SYMPTOMS   Pain in the upper abdomen that may radiate to the back.  Tenderness and swelling of the abdomen.  Nausea and vomiting. DIAGNOSIS  Your caregiver will perform a physical exam. Blood and stool tests may be done to confirm the diagnosis. Imaging tests may also be done, such as X-rays, CT scans, or an ultrasound of the abdomen. TREATMENT  Treatment usually requires a stay in the hospital. Treatment may include:  Pain medicine.  Fluid replacement through an intravenous line (IV).  Placing a tube in the stomach to remove stomach contents and control vomiting.  Not eating for 3 or 4 days. This gives your pancreas a rest, because enzymes are not being produced that can cause further damage.  Antibiotic medicines if  your condition is caused by an infection.  Surgery of the pancreas or gallbladder. HOME CARE INSTRUCTIONS   Follow the diet advised by your caregiver. This may involve avoiding alcohol and decreasing the amount of fat in your diet.  Eat smaller, more frequent meals. This reduces the amount of digestive juices the pancreas produces.  Drink enough fluids to keep your urine clear or pale yellow.  Only take over-the-counter or prescription medicines as directed by your caregiver.  Avoid drinking alcohol if it caused your condition.  Do not smoke.  Get plenty of rest.  Check your blood sugar at home as directed by your caregiver.  Keep all follow-up appointments as directed by your caregiver. SEEK MEDICAL CARE IF:   You do not recover as quickly as expected.  You develop new or worsening symptoms.  You have persistent pain, weakness, or nausea.  You recover and then have another episode of pain. SEEK IMMEDIATE MEDICAL CARE IF:   You are unable to eat or keep fluids down.  Your pain becomes severe.  You have a fever or persistent symptoms for more than 2 to 3 days.  You have a fever and your symptoms suddenly get worse.  Your skin or the white part of your eyes turn yellow (jaundice).  You develop vomiting.  You feel dizzy, or you faint.  Your blood sugar is high (over 300 mg/dL). MAKE SURE YOU:   Understand these instructions.  Will watch your condition.  Will get help right away if you are not doing well or get worse.   This information is not intended to replace advice given to you by your  health care provider. Make sure you discuss any questions you have with your health care provider.   Document Released: 02/04/2005 Document Revised: 08/06/2011 Document Reviewed: 05/16/2011 Elsevier Interactive Patient Education [employer].

## 2015-08-12 NOTE — ED Provider Notes (Signed)
CSN: [id number]     Arrival date & time 08/12/15  1605 History  By signing my name below, I, [name], attest that this documentation has been prepared under the direction and in the presence of Ezequiel Essex, MD . Electronically Signed: [name], Scribe. 08/12/2015. 8:01 PM.     Chief Complaint  Patient presents with  . Abdominal Pain    The history is provided by the patient. No language interpreter was used.     HPI Comments:  Spencer [name] is a 60 y.o. male with a history of CAD and NIDDM, who presents to the Emergency Department complaining of gradually worsening, constant, non-radiating upper abdominal pain x 4-5 days. He rates his pain a 7/10. Pt is currently on Victoza for DM which he started ~ 1 year ago; he was warned about possible pancreatitis side effect which is why he came in. He reports associated nausea. He denies vomiting fever, appetite change, CP, SOB, diarrhea, and constipation. No alleviating factors noted. He notes pain today is dissimilar to pain felt with past MI and other cardiac episodes. His only abdominal surgery is a hernia repair.    [place]- PCP Past Medical History  Diagnosis Date  . Morbidly obese ([place])   . Coronary artery disease     a. NSTEMI 10/12: s/p CABG with L-LAD, S-OM1/OM2, S-RV marg/RCA;  b. 07/2013 Cath/PCI: LM <10, LAD 70-80p, 37m, D1 90, RI 30ost, LCX 40-50p, 45m, OM1 90, OM2 100, RCA 100p, VG->OM1->OM2 patent with 90 @ anast (2.5x12 Xience Alpine DES), RCA 100p, LIMA->LAD nl, VG->Diag nl, VG->RV marginal/RCA 90p (3.0x23 Xience Alpine DES);  c. 07/2013 Echo: EF 55-60%.  . Hypertension   . Hyperlipidemia   . GERD (gastroesophageal reflux disease)   . IBS (irritable bowel syndrome)   . Pleural effusion, left 11/2010    Postop; resolved with p.o. diuresis  . Benign neoplasm of colon 04/01/2000    Hyperplastic  . Obesity   . Myocardial infarction ([place]) 2012  . DM2 (diabetes mellitus, type 2) ([place])   .  Arthritis     "back; shoulders; knees" (07/23/2013)  . Back pain, chronic     "all over" (07/23/2013)  . Depression     "pain related"  . Anxiety attack   . Knee pain, bilateral     a. since 01/2013.   Past Surgical History  Procedure Laterality Date  . Lumbar fusion      MULTIPLE; DR. Ellene Route  . Shoulder arthroscopy w/ rotator cuff repair Bilateral   . Coronary artery bypass graft  11/26/10    x 6 SURGEON DR. PETER VAN TRIGT  . Cardiac catheterization  11/2010  . Coronary angioplasty with stent placement  07/23/2013    "2"  . Hernia repair      UHR  . Umbilical hernia repair    . Back surgery    . Carpal tunnel release Bilateral   . Left heart catheterization with coronary/graft angiogram N/A 07/23/2013    Procedure: LEFT HEART CATHETERIZATION WITH Beatrix Fetters;  Surgeon: Peter M Martinique, MD;  Location: [facility] CATH LAB;  Service: Cardiovascular;  Laterality: N/A;   Family History  Problem Relation Age of Onset  . Hypertension Neg Hx   . Heart disease Neg Hx   . Peripheral vascular disease Neg Hx   . Diabetes Neg Hx    Social History  Substance Use Topics  . Smoking status: Never Smoker   . Smokeless tobacco: Never Used  . Alcohol Use: No  Review of Systems  Constitutional: Negative for fever and chills.  Respiratory: Negative for shortness of breath.   Cardiovascular: Negative for chest pain.  Gastrointestinal: Positive for nausea and abdominal pain. Negative for vomiting.    Allergies  Brilinta; Codeine; Other; and Statins  Home Medications   Prior to Admission medications   Medication Sig Start Date End Date Taking? Authorizing Provider  Ascorbic Acid (VITAMIN C) 1000 MG tablet Take 1,000 mg by mouth daily.    Historical Provider, MD  aspirin EC 81 MG tablet Take 81 mg by mouth daily.     Historical Provider, MD  cholecalciferol (VITAMIN D) 1000 UNITS tablet Take 1,000 Units by mouth daily.    Historical Provider, MD  docusate sodium (COLACE) 100 MG  capsule Take 200 mg by mouth at bedtime.      Historical Provider, MD  EFFIENT 10 MG TABS tablet TAKE 1 TABLET BY MOUTH DAILY. 08/09/14   Darlin Coco, MD  glyBURIDE-metformin (GLUCOVANCE) 5-500 MG per tablet Take 2 tablets by mouth 2 (two) times daily with a meal. Taking a half a tablet once a day currently 07/24/13   Rogelia Mire, NP  HYDROcodone-acetaminophen (NORCO/VICODIN) 5-325 MG tablet Take 1 tablet by mouth every 4 (four) hours as needed. 08/12/15   Ezequiel Essex, MD  lisinopril (PRINIVIL,ZESTRIL) 40 MG tablet Take 1 tablet (40 mg total) by mouth daily. 07/24/13   Rogelia Mire, NP  methocarbamol (ROBAXIN) 500 MG tablet Take 500 mg by mouth 3 (three) times daily. scheduled    Historical Provider, MD  metoprolol tartrate (LOPRESSOR) 25 MG tablet Take 25 mg by mouth 2 (two) times daily.    Historical Provider, MD  metroNIDAZOLE (METROGEL) 1 % gel Apply 1 application topically daily as needed (rosacea).    Historical Provider, MD  nitroGLYCERIN (NITROSTAT) 0.4 MG SL tablet Place 1 tablet (0.4 mg total) under the tongue every 5 (five) minutes as needed for chest pain (CP or SOB). 07/24/13   Rogelia Mire, NP  omega-3 acid ethyl esters (LOVAZA) 1 G capsule Take 1 g by mouth 2 (two) times daily.     Historical Provider, MD  ondansetron (ZOFRAN) 4 MG tablet Take 1 tablet (4 mg total) by mouth every 6 (six) hours. 08/12/15   Ezequiel Essex, MD  polyethylene glycol powder (MIRALAX) powder Take 17 g by mouth at bedtime as needed (constipation).     Historical Provider, MD  prasugrel (EFFIENT) 10 MG TABS tablet Take 1 tablet (10 mg total) by mouth daily. 08/02/13   Darlin Coco, MD  pregabalin (LYRICA) 150 MG capsule Take 150 mg by mouth 2 (two) times daily.      Historical Provider, MD  pyridOXINE (VITAMIN B-6) 100 MG tablet Take 100 mg by mouth daily.     Historical Provider, MD  ranitidine (ZANTAC) 300 MG tablet Take 300 mg by mouth daily.    Historical Provider, MD  sertraline  (ZOLOFT) 50 MG tablet Take 50 mg by mouth 2 (two) times daily.      Historical Provider, MD  traMADol (ULTRAM) 50 MG tablet Take 50 mg by mouth 3 (three) times daily. scheduled    Historical Provider, MD  zolpidem (AMBIEN) 10 MG tablet Take 10 mg by mouth at bedtime.     Historical Provider, MD   BP 184/89 mmHg  Pulse 67  Temp(Src) 98.6 F (37 C) (Oral)  Resp 16  Wt 297 lb 4.8 oz (134.854 kg)  SpO2 98% Physical Exam  Constitutional: He is oriented  to person, place, and time. He appears well-developed. No distress.  Obese   HENT:  Head: Normocephalic and atraumatic.  Mouth/Throat: Oropharynx is clear and moist. No oropharyngeal exudate.  Eyes: Conjunctivae and EOM are normal. Pupils are equal, round, and reactive to light.  Neck: Normal range of motion. Neck supple.  No meningismus.  Cardiovascular: Normal rate, regular rhythm, normal heart sounds and intact distal pulses.   No murmur heard. Pulmonary/Chest: Effort normal and breath sounds normal. No respiratory distress.  Abdominal: Soft. There is tenderness. There is no rebound and no guarding.  Epigastric and RUQ tenderness No CVA tenderness  Musculoskeletal: Normal range of motion. He exhibits no edema or tenderness.  Neurological: He is alert and oriented to person, place, and time. No cranial nerve deficit. He exhibits normal muscle tone. Coordination normal.  No ataxia on finger to nose bilaterally. No pronator drift. 5/5 strength throughout. CN 2-12 intact.Equal grip strength. Sensation intact.   Skin: Skin is warm.  Psychiatric: He has a normal mood and affect. His behavior is normal.  Nursing note and vitals reviewed.   ED Course  Procedures  DIAGNOSTIC STUDIES:  Oxygen Saturation is 98% on RA, normal by my interpretation.    COORDINATION OF CARE:  7:59 PM Discussed treatment plan with pt at bedside and pt agreed to plan.  Labs Review Labs Reviewed  LIPASE, BLOOD - Abnormal; Notable for the following:     Lipase 163 (*)    All other components within normal limits  COMPREHENSIVE METABOLIC PANEL - Abnormal; Notable for the following:    Sodium 134 (*)    Chloride 98 (*)    Glucose, Bld 159 (*)    Total Protein 8.4 (*)    All other components within normal limits  CBC  URINALYSIS, ROUTINE W REFLEX MICROSCOPIC (NOT AT [facility])  TROPONIN I  TROPONIN I  I-STAT CG4 LACTIC ACID, ED  I-STAT CG4 LACTIC ACID, ED    Imaging Review Ct Abdomen Pelvis W Contrast  08/12/2015  CLINICAL DATA:  Acute onset of upper abdominal pain and nausea. Initial encounter. EXAM: CT ABDOMEN AND PELVIS WITH CONTRAST TECHNIQUE: Multidetector CT imaging of the abdomen and pelvis was performed using the standard protocol following bolus administration of intravenous contrast. CONTRAST:  138mL ISOVUE-300 IOPAMIDOL (ISOVUE-300) INJECTION 61% COMPARISON:  CT of the abdomen and pelvis performed 03/17/2015, and right upper quadrant ultrasound performed earlier today at 9:33 p.m. FINDINGS: The visualized lung bases are clear. Scattered coronary artery calcifications are seen. The liver and spleen are unremarkable in appearance. The gallbladder is within normal limits. The pancreas and adrenal glands are unremarkable. A 4 mm nonobstructing stone is noted at the interpole region of the left kidney. Mild nonspecific perinephric stranding is noted bilaterally. There is no evidence of hydronephrosis. No obstructing ureteral stones are seen. No free fluid is identified. The small bowel is unremarkable in appearance. The stomach is within normal limits. No acute vascular abnormalities are seen. Scattered calcification is seen along the abdominal aorta and its branches. The appendix is not definitely characterized; there is no evidence for appendicitis. The colon is grossly unremarkable in appearance. The bladder is mildly distended and grossly unremarkable. The prostate remains normal in size. No inguinal lymphadenopathy is seen. No acute osseous  abnormalities are identified. The patient is status post lumbosacral spinal at L2-S1. IMPRESSION: 1. No acute abnormality seen within the abdomen or pelvis. 2. Scattered calcification along the abdominal aorta and its branches. 3. Scattered coronary artery calcifications seen. 4. 4  mm nonobstructing stone at the interpole region of the left kidney. Electronically Signed   By: Garald Balding M.D.   On: 08/12/2015 22:11   US Abdomen Limited Ruq  08/12/2015  CLINICAL DATA:  Acute onset of upper abdominal pain and nausea. Initial encounter. EXAM: US ABDOMEN LIMITED - RIGHT UPPER QUADRANT COMPARISON:  CT of the abdomen and pelvis from 03/17/2015 FINDINGS: Gallbladder: No gallstones or wall thickening visualized. No sonographic Murphy sign noted by sonographer. Common bile duct: Diameter: 0.5 cm, within normal limits in caliber. Liver: No focal lesion identified. Diffusely increased parenchymal echogenicity likely reflects fatty infiltration. IMPRESSION: 1. No acute abnormality seen at the right upper quadrant. 2. Diffuse fatty infiltration within the liver. Electronically Signed   By: Garald Balding M.D.   On: 08/12/2015 21:50   I have personally reviewed and evaluated these images and lab results as part of my medical decision-making.   EKG Interpretation   Date/Time:  Saturday August 12 2015 16:41:18 EDT Ventricular Rate:  79 PR Interval:  156 QRS Duration: 96 QT Interval:  390 QTC Calculation: 447 R Axis:   56 Text Interpretation:  Normal sinus rhythm Normal ECG No significant change  was found Confirmed by Wyvonnia Dusky  MD, Patriece Archbold [phone]) on 08/12/2015 5:03:53  PM      MDM   Final diagnoses:  RUQ pain  Acute pancreatitis, unspecified pancreatitis type   4 days of constant epigastric pain with nausea. No vomiting. No chest pain or shortness of breath but does have history of CABG. No fever. Patient on Victoza which their concern is causing pancreatitis.  Lipase is 163. LFTs are normal. Troponin  negative 2 EKG unchanged.  RUQ [place] negative for gallstones. CT without acute pathology as well.  Suspect mild pancreatitis possibly due to victoza.  Patient denies alcohol  Use.  Well appearing, he want to go home. Tolerating PO in the ED.  Pain improved with GI cocktail. Start PPI.  Will advise clear liquid diet, pain and nausea control, stop victoza. Follow up with PCP on Monday. Return precautions discussed.   I personally performed the services described in this documentation, which was scribed in my presence. The recorded information has been reviewed and is accurate.   Ezequiel Essex, MD 08/13/15 [phone]

## 2015-08-30 MED FILL — ALPRAZolam 0.5 MG TABS: 0.5 | 30 days supply | Qty: 30 | Fill #0

## 2015-09-11 MED FILL — METHOCARBAMOL 500 MG TABLET: 500 | 90 days supply | Qty: 270 | Fill #1

## 2015-09-11 MED FILL — LISINOPRIL 40 MG TABLET: 40 | 90 days supply | Qty: 90 | Fill #0

## 2015-09-11 MED FILL — SERTRALINE HCL 50 MG TABLET: 50 | 90 days supply | Qty: 180 | Fill #2

## 2015-09-11 MED FILL — METOPROLOL SUCC ER 25 MG TA: 25 | 90 days supply | Qty: 180 | Fill #0

## 2015-09-11 MED FILL — METFORMIN HCL ER 500 MG TAB: 500 | 90 days supply | Qty: 360 | Fill #0

## 2015-09-11 MED FILL — OMEGA-3 ETHYL ESTERS 1 GM C: 1 | 90 days supply | Qty: 360 | Fill #1

## 2015-09-11 MED FILL — raNITIdine HCL 300 MG TABS: 300 | 90 days supply | Qty: 90 | Fill #0

## 2015-09-11 MED FILL — LYRICA 150 MG CAPSULE: 150 | 90 days supply | Qty: 180 | Fill #1

## 2015-09-11 MED FILL — traMADol HCL 50 MG TABS: 50 | 90 days supply | Qty: 720 | Fill #1

## 2015-09-11 MED FILL — ZOLPIDEM TARTRATE 10 MG TAB: 10 | 90 days supply | Qty: 90 | Fill #0

## 2015-09-13 DIAGNOSIS — Z6839 Body mass index (BMI) 39.0-39.9, adult: Secondary | ICD-10-CM | POA: Diagnosis not present

## 2015-09-13 DIAGNOSIS — E1140 Type 2 diabetes mellitus with diabetic neuropathy, unspecified: Secondary | ICD-10-CM | POA: Diagnosis not present

## 2015-09-13 DIAGNOSIS — I2510 Atherosclerotic heart disease of native coronary artery without angina pectoris: Secondary | ICD-10-CM | POA: Diagnosis not present

## 2015-09-13 DIAGNOSIS — R1084 Generalized abdominal pain: Secondary | ICD-10-CM | POA: Diagnosis not present

## 2015-09-13 DIAGNOSIS — I10 Essential (primary) hypertension: Secondary | ICD-10-CM | POA: Diagnosis not present

## 2015-09-13 MED FILL — JARDIANCE 25 MG TABLET: 25 | 30 days supply | Qty: 30 | Fill #0

## 2015-10-09 MED FILL — JARDIANCE 25 MG TABLET: 25 | 90 days supply | Qty: 90 | Fill #1

## 2015-11-01 DIAGNOSIS — E784 Other hyperlipidemia: Secondary | ICD-10-CM | POA: Diagnosis not present

## 2015-11-01 DIAGNOSIS — I2510 Atherosclerotic heart disease of native coronary artery without angina pectoris: Secondary | ICD-10-CM | POA: Diagnosis not present

## 2015-11-01 DIAGNOSIS — E1140 Type 2 diabetes mellitus with diabetic neuropathy, unspecified: Secondary | ICD-10-CM | POA: Diagnosis not present

## 2015-11-01 DIAGNOSIS — F418 Other specified anxiety disorders: Secondary | ICD-10-CM | POA: Diagnosis not present

## 2015-11-01 DIAGNOSIS — F1320 Sedative, hypnotic or anxiolytic dependence, uncomplicated: Secondary | ICD-10-CM | POA: Diagnosis not present

## 2015-11-01 DIAGNOSIS — E1151 Type 2 diabetes mellitus with diabetic peripheral angiopathy without gangrene: Secondary | ICD-10-CM | POA: Diagnosis not present

## 2015-11-01 DIAGNOSIS — G629 Polyneuropathy, unspecified: Secondary | ICD-10-CM | POA: Diagnosis not present

## 2015-11-01 DIAGNOSIS — J302 Other seasonal allergic rhinitis: Secondary | ICD-10-CM | POA: Diagnosis not present

## 2015-11-06 MED FILL — PRASUGREL 10 MG TABLET: 10 | 90 days supply | Qty: 90 | Fill #1

## 2015-11-22 DIAGNOSIS — I10 Essential (primary) hypertension: Secondary | ICD-10-CM | POA: Diagnosis not present

## 2015-11-22 DIAGNOSIS — E1140 Type 2 diabetes mellitus with diabetic neuropathy, unspecified: Secondary | ICD-10-CM | POA: Diagnosis not present

## 2015-11-22 DIAGNOSIS — Z6838 Body mass index (BMI) 38.0-38.9, adult: Secondary | ICD-10-CM | POA: Diagnosis not present

## 2015-11-22 MED FILL — LANTUS SOLOSTAR 100 UNITS/M: 100 | 90 days supply | Qty: 15 | Fill #0

## 2015-11-24 ENCOUNTER — Ambulatory Visit: Payer: 59

## 2015-11-24 ENCOUNTER — Other Ambulatory Visit: Payer: Self-pay | Admitting: Podiatry

## 2015-11-24 ENCOUNTER — Ambulatory Visit (INDEPENDENT_AMBULATORY_CARE_PROVIDER_SITE_OTHER): Payer: 59 | Admitting: Podiatry

## 2015-11-24 ENCOUNTER — Encounter: Payer: Self-pay | Admitting: Podiatry

## 2015-11-24 ENCOUNTER — Ambulatory Visit (INDEPENDENT_AMBULATORY_CARE_PROVIDER_SITE_OTHER): Payer: 59

## 2015-11-24 VITALS — BP 116/62 | HR 57 | Resp 14

## 2015-11-24 DIAGNOSIS — R52 Pain, unspecified: Secondary | ICD-10-CM

## 2015-11-24 DIAGNOSIS — E1149 Type 2 diabetes mellitus with other diabetic neurological complication: Secondary | ICD-10-CM

## 2015-11-24 DIAGNOSIS — Q828 Other specified congenital malformations of skin: Secondary | ICD-10-CM

## 2015-11-24 DIAGNOSIS — L84 Corns and callosities: Secondary | ICD-10-CM

## 2015-11-24 NOTE — Progress Notes (Signed)
   Subjective:    Patient ID: Spencer [name], male    DOB: 07-07-1955, 60 y.o.   MRN: [id number]  HPI    Review of Systems     Objective:   Physical Exam        Assessment & Plan:

## 2015-11-24 NOTE — Patient Instructions (Signed)
Diabetes and Foot Care Diabetes may cause you to have problems because of poor blood supply (circulation) to your feet and legs. This may cause the skin on your feet to become thinner, break easier, and heal more slowly. Your skin may become dry, and the skin may peel and crack. You may also have nerve damage in your legs and feet causing decreased feeling in them. You may not notice minor injuries to your feet that could lead to infections or more serious problems. Taking care of your feet is one of the most important things you can do for yourself.  HOME CARE INSTRUCTIONS  Wear shoes at all times, even in the house. Do not go barefoot. Bare feet are easily injured.  Check your feet daily for blisters, cuts, and redness. If you cannot see the bottom of your feet, use a mirror or ask someone for help.  Wash your feet with warm water (do not use hot water) and mild soap. Then pat your feet and the areas between your toes until they are completely dry. Do not soak your feet as this can dry your skin.  Apply a moisturizing lotion or petroleum jelly (that does not contain alcohol and is unscented) to the skin on your feet and to dry, brittle toenails. Do not apply lotion between your toes.  Trim your toenails straight across. Do not dig under them or around the cuticle. File the edges of your nails with an emery board or nail file.  Do not cut corns or calluses or try to remove them with medicine.  Wear clean socks or stockings every day. Make sure they are not too tight. Do not wear knee-high stockings since they may decrease blood flow to your legs.  Wear shoes that fit properly and have enough cushioning. To break in new shoes, wear them for just a few hours a day. This prevents you from injuring your feet. Always look in your shoes before you put them on to be sure there are no objects inside.  Do not cross your legs. This may decrease the blood flow to your feet.  If you find a minor scrape,  cut, or break in the skin on your feet, keep it and the skin around it clean and dry. These areas may be cleansed with mild soap and water. Do not cleanse the area with peroxide, alcohol, or iodine.  When you remove an adhesive bandage, be sure not to damage the skin around it.  If you have a wound, look at it several times a day to make sure it is healing.  Do not use heating pads or hot water bottles. They may burn your skin. If you have lost feeling in your feet or legs, you may not know it is happening until it is too late.  Make sure your health care provider performs a complete foot exam at least annually or more often if you have foot problems. Report any cuts, sores, or bruises to your health care provider immediately. SEEK MEDICAL CARE IF:   You have an injury that is not healing.  You have cuts or breaks in the skin.  You have an ingrown nail.  You notice redness on your legs or feet.  You feel burning or tingling in your legs or feet.  You have pain or cramps in your legs and feet.  Your legs or feet are numb.  Your feet always feel cold. SEEK IMMEDIATE MEDICAL CARE IF:   There is increasing redness,   swelling, or pain in or around a wound.  There is a red line that goes up your leg.  Pus is coming from a wound.  You develop a fever or as directed by your health care provider.  You notice a bad smell coming from an ulcer or wound.   This information is not intended to replace advice given to you by your health care provider. Make sure you discuss any questions you have with your health care provider.   Document Released: 02/02/2000 Document Revised: 10/07/2012 Document Reviewed: 07/14/2012 Elsevier Interactive Patient Education 2016 [employer]  

## 2015-11-24 NOTE — Progress Notes (Signed)
   Subjective:    Patient ID: GARIN KOPERSKI, male    DOB: Sep 17, 1955, 60 y.o.   MRN: [id number]  HPI  60 year old male presents the office today for diabetic risk assessment as well as for painful calluses on his left big toe as well as for a possible wart was left foot. He gets calluses to both of his big toes as well as the bottom of his right foot on the ball of his foot. He states his blood sugar has been somewhat elevated recently due to change in medicine. His last A1c was 8. He has previously been diagnosed with diabetic neuropathy takes Lyrica for this. He denies any claudication symptoms. No other complaints at this time.  Review of Systems  All other systems reviewed and are negative.      Objective:   Physical Exam General: AAO x3, NAD  Dermatological: Hyperkeratotic lesion is present bilateral medial hallux and upon debridement there is no ongoing ulceration however on left side there is evidence of old blood underneath the callus. Is also hyperkeratotic lesion right foot some metatarsal 5. No underlying ulceration, drainage or any signs of infection. On the left foot submetatarsal 5 areas of pinpoint annular hyperkeratotic lesion consistent with a porokeratosis. Upon debridement there was no pinpoint bleeding or evidence of verruca. There are no other open lesions or pre-ulcer lesions identified.  Vascular: Dorsalis Pedis artery and Posterior Tibial artery pedal pulses are 2/4 bilateral with immedate capillary fill time. Pedal hair growth present.  There is no pain with calf compression, swelling, warmth, erythema.   Neruologic: Grossly intact via light touch bilateral. Vibratory intact via tuning fork bilateral. Protective threshold with Semmes Wienstein monofilament intact to all pedal sites bilateral. Subjectively he does have neuropathy symptoms as previously been diagnosed with neuropathy.  Musculoskeletal: No gross boney pedal deformities bilateral. No pain, crepitus, or  limitation noted with foot and ankle range of motion bilateral. Muscular strength 5/5 in all groups tested bilateral.  Gait: Unassisted, Nonantalgic.      Assessment & Plan:  60 year old male symptomatic hyperkeratotic lesions 4, neuropathy -Treatment options discussed including all alternatives, risks, and complications -Etiology of symptoms were discussed -Keratotic lesions debrided 4 without complications or bleeding. -On the porokeratosis left foot area was debrided without applications. Pad was placed followed by salicylic acid and a bandage. Post procedure sections were discussed. Monitor for infection. -Recommend diabetic shoes. Paperwork was completed today for precertification. -Daily foot inspection.  -Follow up in 9 weeks or sooner if any issues are to arise.

## 2015-12-06 ENCOUNTER — Other Ambulatory Visit: Payer: Self-pay | Admitting: Podiatry

## 2015-12-06 DIAGNOSIS — R52 Pain, unspecified: Secondary | ICD-10-CM

## 2015-12-11 MED FILL — LYRICA 150 MG CAPSULE: 150 | 90 days supply | Qty: 180 | Fill #0

## 2015-12-11 MED FILL — METFORMIN HCL ER 500 MG TAB: 500 | 90 days supply | Qty: 360 | Fill #1

## 2015-12-11 MED FILL — traMADol HCL 50 MG TABS: 50 | 90 days supply | Qty: 720 | Fill #0

## 2015-12-11 MED FILL — METHOCARBAMOL 500 MG TABLET: 500 | 90 days supply | Qty: 270 | Fill #2

## 2015-12-11 MED FILL — ZOLPIDEM TARTRATE 10 MG TAB: 10 | 90 days supply | Qty: 90 | Fill #1

## 2015-12-11 MED FILL — raNITIdine HCL 300 MG TABS: 300 | 90 days supply | Qty: 90 | Fill #1

## 2015-12-11 MED FILL — SERTRALINE HCL 50 MG TABLET: 50 | 90 days supply | Qty: 180 | Fill #0

## 2015-12-11 MED FILL — METOPROLOL SUCC ER 25 MG TA: 25 | 90 days supply | Qty: 180 | Fill #1

## 2015-12-11 MED FILL — LISINOPRIL 40 MG TABLET: 40 | 90 days supply | Qty: 90 | Fill #1

## 2015-12-27 MED FILL — CEFDINIR 300 MG CAPSULE: 300 | 7 days supply | Qty: 14 | Fill #0

## 2016-01-04 DIAGNOSIS — Z6839 Body mass index (BMI) 39.0-39.9, adult: Secondary | ICD-10-CM | POA: Diagnosis not present

## 2016-01-04 DIAGNOSIS — I10 Essential (primary) hypertension: Secondary | ICD-10-CM | POA: Diagnosis not present

## 2016-01-04 DIAGNOSIS — E1140 Type 2 diabetes mellitus with diabetic neuropathy, unspecified: Secondary | ICD-10-CM | POA: Diagnosis not present

## 2016-01-05 MED FILL — JARDIANCE 25 MG TABLET: 25 | 90 days supply | Qty: 90 | Fill #2

## 2016-01-05 MED FILL — FREESTYLE LITE TEST STRIP: 90 days supply | Qty: 200 | Fill #2

## 2016-01-05 MED FILL — NOVOFINE 32G NEEDLES: 32G X 6 MM | 90 days supply | Qty: 100 | Fill #1

## 2016-01-15 MED FILL — POLYMYXIN B/TMP EYE DROPS: 10000-0.1 | 7 days supply | Qty: 10 | Fill #0

## 2016-01-21 DIAGNOSIS — H538 Other visual disturbances: Secondary | ICD-10-CM | POA: Diagnosis not present

## 2016-01-21 DIAGNOSIS — H5713 Ocular pain, bilateral: Secondary | ICD-10-CM | POA: Diagnosis not present

## 2016-01-21 DIAGNOSIS — H1033 Unspecified acute conjunctivitis, bilateral: Secondary | ICD-10-CM | POA: Diagnosis not present

## 2016-01-21 DIAGNOSIS — E119 Type 2 diabetes mellitus without complications: Secondary | ICD-10-CM | POA: Diagnosis not present

## 2016-01-22 DIAGNOSIS — N35011 Post-traumatic bulbous urethral stricture: Secondary | ICD-10-CM | POA: Diagnosis not present

## 2016-01-22 DIAGNOSIS — R310 Gross hematuria: Secondary | ICD-10-CM | POA: Diagnosis not present

## 2016-01-22 MED FILL — GATIFLOXACIN 0.5% EYE DROPS: 0.5 | 6 days supply | Qty: 3 | Fill #0

## 2016-01-26 MED FILL — GATIFLOXACIN 0.5% EYE DROPS: 0.5 | 6 days supply | Qty: 3 | Fill #1

## 2016-02-05 DIAGNOSIS — N35011 Post-traumatic bulbous urethral stricture: Secondary | ICD-10-CM | POA: Diagnosis not present

## 2016-02-05 MED FILL — PRASUGREL 10 MG TABLET: 10 | 90 days supply | Qty: 90 | Fill #2

## 2016-02-23 ENCOUNTER — Ambulatory Visit (INDEPENDENT_AMBULATORY_CARE_PROVIDER_SITE_OTHER): Payer: 59 | Admitting: Podiatry

## 2016-02-23 DIAGNOSIS — E1149 Type 2 diabetes mellitus with other diabetic neurological complication: Secondary | ICD-10-CM

## 2016-02-23 DIAGNOSIS — M79675 Pain in left toe(s): Secondary | ICD-10-CM

## 2016-02-23 DIAGNOSIS — M79674 Pain in right toe(s): Secondary | ICD-10-CM | POA: Diagnosis not present

## 2016-02-23 DIAGNOSIS — Q828 Other specified congenital malformations of skin: Secondary | ICD-10-CM

## 2016-02-23 DIAGNOSIS — L84 Corns and callosities: Secondary | ICD-10-CM

## 2016-02-23 DIAGNOSIS — B351 Tinea unguium: Secondary | ICD-10-CM

## 2016-02-23 NOTE — Progress Notes (Signed)
Subjective: 61 y.o. returns the office today for painful, elongated, thickened toenails which he cannot trim himself. Denies any redness or drainage around the nails. He has  calluses to both of his feet. He states that the callus on the right foot and he points to submetatarsal 5 started become painful when he walks and puts pressure to the area. He has not has a drainage or pus. No swelling or redness to his feet. He also presents a good measure for diabetic shoes. Denies any acute changes since last appointment and no new complaints today. Denies any systemic complaints such as fevers, chills, nausea, vomiting.   Objective: AAO 3, NAD DP/PT pulses palpable, CRT less than 3 seconds Protective sensation decreased with Simms Weinstein monofilament Nails hypertrophic, dystrophic, elongated, brittle, discolored 10. There is tenderness overlying the nails 1-5 bilaterally. There is no surrounding erythema or drainage along the nail sites. Pre-ulcer callus present on the medial aspect the left hallux. Also, hyperkeratotic lesion to the right second metatarsal 5. Upon debridement of the right second metatarsal 5 lesion is a small superficial abrasion type lesion. There is no probing. There is no swelling erythema, ascending cellulitis. There is no fractures or crepitus is no malodor. No clinical signs of infection. No open lesions or pre-ulcerative lesions are identified. No other areas of tenderness bilateral lower extremities. No overlying edema, erythema, increased warmth. No pain with calf compression, swelling, warmth, erythema.  Assessment: Patient presents with symptomatic onychomycosis  Plan: -Treatment options including alternatives, risks, complications were discussed -Nails sharply debrided 10 without complication/bleeding. -Hyperkeratotic lesion debrided 2. Upon debridement of the right foot second metatarsal 5 lesion small superficial abrasion lesion was present. There is dried blood  under the callus. Recommended Neosporin and a bandage daily. If not healed in 2 weeks call the office for follow-up. -He was measured for diabetic shoes today. *After I evaluated patient he was on his way out of the office and he cut his year with his fingernail. The bleeding stopped and he left the office. Recommended follow with his primary care physician or physician any worsening the good urgent care. -Discussed daily foot inspection. If there are any changes, to call the office immediately.  -Follow-up in 3 months or sooner if any problems are to arise. In the meantime, encouraged to call the office with any questions, concerns, changes symptoms.  Celesta Gentile, DPM

## 2016-02-26 ENCOUNTER — Telehealth: Payer: Self-pay | Admitting: *Deleted

## 2016-02-26 NOTE — Telephone Encounter (Signed)
I sent out the diabetic form box and I ordered the shoes on 02/23/16. Lattie Haw

## 2016-03-04 MED FILL — OMEGA-3 ETHYL ESTERS 1 GM C: 1 | 90 days supply | Qty: 360 | Fill #2

## 2016-03-11 MED FILL — METHOCARBAMOL 500 MG TABLET: 500 | 90 days supply | Qty: 270 | Fill #0

## 2016-03-11 MED FILL — METFORMIN HCL ER 500 MG TAB: 500 | 90 days supply | Qty: 360 | Fill #2

## 2016-03-11 MED FILL — ZOLPIDEM TARTRATE 10 MG TAB: 10 | 90 days supply | Qty: 90 | Fill #0

## 2016-03-11 MED FILL — raNITIdine HCL 300 MG TABS: 300 | 90 days supply | Qty: 90 | Fill #2

## 2016-03-11 MED FILL — traMADol HCL 50 MG TABS: 50 | 90 days supply | Qty: 720 | Fill #1

## 2016-03-11 MED FILL — METOPROLOL SUCC ER 25 MG TA: 25 | 90 days supply | Qty: 180 | Fill #2

## 2016-03-11 MED FILL — SERTRALINE HCL 50 MG TABLET: 50 | 90 days supply | Qty: 180 | Fill #1

## 2016-03-11 MED FILL — LYRICA 150 MG CAPSULE: 150 | 90 days supply | Qty: 180 | Fill #1

## 2016-03-11 MED FILL — LANTUS SOLOSTAR 100 UNITS/M: 100 | 90 days supply | Qty: 15 | Fill #1

## 2016-03-11 MED FILL — LISINOPRIL 40 MG TABLET: 40 | 90 days supply | Qty: 90 | Fill #2

## 2016-03-19 DIAGNOSIS — H52203 Unspecified astigmatism, bilateral: Secondary | ICD-10-CM | POA: Diagnosis not present

## 2016-03-25 ENCOUNTER — Ambulatory Visit (INDEPENDENT_AMBULATORY_CARE_PROVIDER_SITE_OTHER): Payer: 59 | Admitting: Podiatry

## 2016-03-25 DIAGNOSIS — E1149 Type 2 diabetes mellitus with other diabetic neurological complication: Secondary | ICD-10-CM | POA: Diagnosis not present

## 2016-03-25 DIAGNOSIS — L84 Corns and callosities: Secondary | ICD-10-CM

## 2016-03-25 DIAGNOSIS — M2040 Other hammer toe(s) (acquired), unspecified foot: Secondary | ICD-10-CM | POA: Diagnosis not present

## 2016-03-25 MED FILL — JARDIANCE 25 MG TABLET: 25 | 90 days supply | Qty: 90 | Fill #0

## 2016-03-25 NOTE — Progress Notes (Signed)
Patient ID: Spencer [name], male   DOB: 1955/04/17, 61 y.o.   MRN: [id number]  Patient presents for diabetic shoe pick up, shoes are tried on for good fit.  Patient received 1 pair Apex [id number] Knit lace up in black/gray in men's size 13 wide and 3 pairs custom molded diabetic inserts.  Verbal and written break in and wear instructions given.  Patient will follow up for scheduled routine care.

## 2016-03-25 NOTE — Patient Instructions (Signed)

## 2016-04-03 DIAGNOSIS — E1140 Type 2 diabetes mellitus with diabetic neuropathy, unspecified: Secondary | ICD-10-CM | POA: Diagnosis not present

## 2016-04-03 DIAGNOSIS — Z6839 Body mass index (BMI) 39.0-39.9, adult: Secondary | ICD-10-CM | POA: Diagnosis not present

## 2016-04-03 DIAGNOSIS — I10 Essential (primary) hypertension: Secondary | ICD-10-CM | POA: Diagnosis not present

## 2016-04-11 MED FILL — UNIFINE PENTIPS 31GX3/16": 31G X 5 MM | 90 days supply | Qty: 100 | Fill #0

## 2016-04-11 MED FILL — FREESTYLE LITE TEST STRIP: 90 days supply | Qty: 200 | Fill #0

## 2016-04-11 MED FILL — UNIFINE PENTIPS 31GX3/16: 31G X 5 MM | 90 days supply | Qty: 100 | Fill #0

## 2016-04-12 MED FILL — CIPROFLOXACIN HCL 500 MG TA: 500 | 7 days supply | Qty: 14 | Fill #0

## 2016-05-06 MED FILL — PRASUGREL 10 MG TABLET: 10 | 90 days supply | Qty: 90 | Fill #3

## 2016-05-13 DIAGNOSIS — E1140 Type 2 diabetes mellitus with diabetic neuropathy, unspecified: Secondary | ICD-10-CM | POA: Diagnosis not present

## 2016-05-13 DIAGNOSIS — M545 Low back pain: Secondary | ICD-10-CM | POA: Diagnosis not present

## 2016-05-13 DIAGNOSIS — Z0000 Encounter for general adult medical examination without abnormal findings: Secondary | ICD-10-CM | POA: Diagnosis not present

## 2016-05-13 DIAGNOSIS — F418 Other specified anxiety disorders: Secondary | ICD-10-CM | POA: Diagnosis not present

## 2016-05-13 DIAGNOSIS — G629 Polyneuropathy, unspecified: Secondary | ICD-10-CM | POA: Diagnosis not present

## 2016-05-13 DIAGNOSIS — F1320 Sedative, hypnotic or anxiolytic dependence, uncomplicated: Secondary | ICD-10-CM | POA: Diagnosis not present

## 2016-05-13 DIAGNOSIS — E784 Other hyperlipidemia: Secondary | ICD-10-CM | POA: Diagnosis not present

## 2016-05-13 DIAGNOSIS — Z125 Encounter for screening for malignant neoplasm of prostate: Secondary | ICD-10-CM | POA: Diagnosis not present

## 2016-05-13 DIAGNOSIS — I2510 Atherosclerotic heart disease of native coronary artery without angina pectoris: Secondary | ICD-10-CM | POA: Diagnosis not present

## 2016-05-16 DIAGNOSIS — N35011 Post-traumatic bulbous urethral stricture: Secondary | ICD-10-CM | POA: Diagnosis not present

## 2016-05-16 DIAGNOSIS — R3915 Urgency of urination: Secondary | ICD-10-CM | POA: Diagnosis not present

## 2016-05-16 DIAGNOSIS — N50812 Left testicular pain: Secondary | ICD-10-CM | POA: Diagnosis not present

## 2016-05-21 DIAGNOSIS — N50812 Left testicular pain: Secondary | ICD-10-CM | POA: Diagnosis not present

## 2016-05-24 ENCOUNTER — Encounter: Payer: Self-pay | Admitting: Podiatry

## 2016-05-24 ENCOUNTER — Ambulatory Visit (INDEPENDENT_AMBULATORY_CARE_PROVIDER_SITE_OTHER): Payer: 59 | Admitting: Podiatry

## 2016-05-24 DIAGNOSIS — B351 Tinea unguium: Secondary | ICD-10-CM | POA: Diagnosis not present

## 2016-05-24 DIAGNOSIS — M79674 Pain in right toe(s): Secondary | ICD-10-CM | POA: Diagnosis not present

## 2016-05-24 DIAGNOSIS — M79675 Pain in left toe(s): Secondary | ICD-10-CM

## 2016-05-24 DIAGNOSIS — S91102A Unspecified open wound of left great toe without damage to nail, initial encounter: Secondary | ICD-10-CM | POA: Diagnosis not present

## 2016-05-24 DIAGNOSIS — E1149 Type 2 diabetes mellitus with other diabetic neurological complication: Secondary | ICD-10-CM

## 2016-05-27 NOTE — Progress Notes (Signed)
Subjective: 61 y.o. returns the office today for painful, elongated, thickened toenails which he cannot trim himself. Denies any redness or drainage around the nails. He has  calluses to both of his feet with a right side worse than left. She is otherwise a very gets sore he gets a wound underneath the callus at times in the area has blood in the callus. Denies any drainage or pus recently redness or red streaks. Denies any acute changes since last appointment and no new complaints today. Denies any systemic complaints such as fevers, chills, nausea, vomiting.   Objective: AAO 3, NAD DP/PT pulses palpable, CRT less than 3 seconds Protective sensation decreased with Simms Weinstein monofilament Nails hypertrophic, dystrophic, elongated, brittle, discolored 10. There is tenderness overlying the nails 1-5 bilaterally. There is no surrounding erythema or drainage along the nail sites. On the medial aspect left hallux is a hyperkeratotic lesion with evidence of dried blood under the callus. Upon debridement there is a superficial wound present there is no probing to bone, undermining or tunneling. There is no swelling erythema, ascending synovitis. There is no fluctuance, crepitus, malodor. No other areas of tenderness bilateral lower extremities. No overlying edema, erythema, increased warmth. No pain with calf compression, swelling, warmth, erythema.  Assessment: Patient presents with symptomatic onychomycosis; superficial wound left medial hallux  Plan: -Treatment options including alternatives, risks, complications were discussed -Nails sharply debrided 10 without complication/bleeding. -Wound sharply debrided and left medial hallux without complications. Recommended iodoform dressing changes daily. This was given him today. Offloading pads were also dispensed today.Monitor for any clinical signs or symptoms of infection and directed to call the office immediately should any occur or go to the  ER. -RTC as scheduled or sooner if needed.   Celesta Gentile, DPM

## 2016-06-03 MED FILL — OMEGA-3 ETHYL ESTERS 1 GM C: 1 | 90 days supply | Qty: 360 | Fill #3

## 2016-06-05 MED FILL — LANTUS SOLOSTAR 100 UNITS/M: 100 | 90 days supply | Qty: 15 | Fill #2

## 2016-06-10 DIAGNOSIS — Z1212 Encounter for screening for malignant neoplasm of rectum: Secondary | ICD-10-CM | POA: Diagnosis not present

## 2016-06-10 MED FILL — ZOLPIDEM TARTRATE 10 MG TAB: 10 | 90 days supply | Qty: 90 | Fill #1

## 2016-06-10 MED FILL — METHOCARBAMOL 500 MG TABLET: 500 | 90 days supply | Qty: 270 | Fill #1

## 2016-06-10 MED FILL — METOPROLOL SUCC ER 25 MG TA: 25 | 90 days supply | Qty: 180 | Fill #3

## 2016-06-10 MED FILL — traMADol HCL 50 MG TABS: 50 | 90 days supply | Qty: 720 | Fill #0

## 2016-06-10 MED FILL — LYRICA 150 MG CAPSULE: 150 | 90 days supply | Qty: 180 | Fill #0

## 2016-06-10 MED FILL — LISINOPRIL 40 MG TABLET: 40 | 90 days supply | Qty: 90 | Fill #3

## 2016-06-10 MED FILL — SERTRALINE HCL 50 MG TABLET: 50 | 90 days supply | Qty: 180 | Fill #2

## 2016-06-10 MED FILL — raNITIdine HCL 300 MG TABS: 300 | 90 days supply | Qty: 90 | Fill #3

## 2016-06-10 MED FILL — METFORMIN HCL ER 500 MG TAB: 500 | 90 days supply | Qty: 360 | Fill #3

## 2016-06-14 ENCOUNTER — Encounter: Payer: Self-pay | Admitting: Podiatry

## 2016-06-14 ENCOUNTER — Ambulatory Visit (INDEPENDENT_AMBULATORY_CARE_PROVIDER_SITE_OTHER): Payer: 59 | Admitting: Podiatry

## 2016-06-14 DIAGNOSIS — L84 Corns and callosities: Secondary | ICD-10-CM | POA: Diagnosis not present

## 2016-06-14 DIAGNOSIS — E1149 Type 2 diabetes mellitus with other diabetic neurological complication: Secondary | ICD-10-CM | POA: Diagnosis not present

## 2016-06-17 NOTE — Progress Notes (Signed)
Subjective: 61 year old male presents the office today for follow-up evaluation of wound to the left medial hallux. He states the area is improved but is still tender to put pressure to the area. He does like to check area on the right foot submetatarsal 5 that he points to is a serious painful swell. Denies any swelling or redness or any drainage. He has continued with Betadine to the wound on the left hallux as well as offloading pads daily. Denies any systemic complaints such as fevers, chills, nausea, vomiting. No acute changes since last appointment, and no other complaints at this time.   Objective: AAO x3, NAD DP/PT pulses palpable bilaterally, CRT less than 3 seconds Along the medial aspect of the left hallux hyperkeratotic lesion with evidence of dried blood underneath the callus. Upon debridement there is no definitive open sores identified save however it is pre-ulcerative. There is no swelling erythema, ascending synovitis. Is no fluctuance, crepitus, malodor. On the right foot second metatarsal 5 the hyperkeratotic lesion. No underlying ulceration, drainage or any clinical signs of infection.  No other open lesions present lesions identified today. There is no pain with calf compression, swelling, warmth, erythema.  Assessment: Healing wound left medial hallux, pre-ulcerative lesion right second metatarsal 5  Plan: -Treatment options discussed including all alternatives, risks, and complications -Etiology of symptoms were discussed -Lesions were sharply debrided bilaterally without any complications. No wound of the right foot but continues to monitor closely. On the left foot the area has improved there is still some mild tenderness on the central aspect of the areas pre-ulcerative. Recommended continue with Betadine wet-to-dry dressing changes this area daily as well as offloading pads. Dispensed further offloading pads. Monitor for any clinical signs or symptoms of infection and  directed to call the office immediately should any occur or go to the ER. -RTC as scheduled or sooner if needed.  Celesta Gentile, DPM

## 2016-06-24 MED FILL — JARDIANCE 25 MG TABLET: 25 | 90 days supply | Qty: 90 | Fill #1

## 2016-06-25 MED FILL — metroNIDAZOLE 1 % GEL: 1 | 30 days supply | Qty: 60 | Fill #0

## 2016-07-03 DIAGNOSIS — E1140 Type 2 diabetes mellitus with diabetic neuropathy, unspecified: Secondary | ICD-10-CM | POA: Diagnosis not present

## 2016-07-03 DIAGNOSIS — I10 Essential (primary) hypertension: Secondary | ICD-10-CM | POA: Diagnosis not present

## 2016-07-03 DIAGNOSIS — Z6839 Body mass index (BMI) 39.0-39.9, adult: Secondary | ICD-10-CM | POA: Diagnosis not present

## 2016-07-05 ENCOUNTER — Encounter: Payer: Self-pay | Admitting: Podiatry

## 2016-07-05 ENCOUNTER — Ambulatory Visit (INDEPENDENT_AMBULATORY_CARE_PROVIDER_SITE_OTHER): Payer: 59 | Admitting: Podiatry

## 2016-07-05 DIAGNOSIS — M79675 Pain in left toe(s): Secondary | ICD-10-CM

## 2016-07-05 DIAGNOSIS — L84 Corns and callosities: Secondary | ICD-10-CM

## 2016-07-05 DIAGNOSIS — B351 Tinea unguium: Secondary | ICD-10-CM | POA: Diagnosis not present

## 2016-07-05 DIAGNOSIS — E1149 Type 2 diabetes mellitus with other diabetic neurological complication: Secondary | ICD-10-CM | POA: Diagnosis not present

## 2016-07-05 DIAGNOSIS — M79674 Pain in right toe(s): Secondary | ICD-10-CM

## 2016-07-05 NOTE — Progress Notes (Signed)
Subjective: 61 y.o. returns the office today for painful, elongated, thickened toenails which he cannot trim himself. Denies any redness or drainage around the nails. He has presents today for follow-up evaluation of a pre-ulcerative callus to the left hallux. Denies any drainage or pus. Denies any acute changes since last appointment and no new complaints today. Denies any systemic complaints such as fevers, chills, nausea, vomiting.   Objective: AAO 3, NAD DP/PT pulses palpable, CRT less than 3 seconds Protective sensation decreased with Simms Weinstein monofilament Nails hypertrophic, dystrophic, elongated, brittle, discolored 10. There is tenderness overlying the nails 1-5 bilaterally. There is no surrounding erythema or drainage along the nail sites. On the medial aspect left hallux is a hyperkeratotic lesion with evidence of dried blood under the callus. Upon debridement there is no undermining ulceration, drainage or any clinical signs of infection. There is no swelling erythema, ascending cellulitis. There is no fluctuance, crepitus, malodor. No other areas of tenderness bilateral lower extremities. No overlying edema, erythema, increased warmth. No pain with calf compression, swelling, warmth, erythema.  Assessment: Patient presents with symptomatic onychomycosis; superficial wound left medial hallux  Plan: -Treatment options including alternatives, risks, complications were discussed -Nails sharply debrided 10 without complication/bleeding. -Pre-ulcerative callus sharply debrided and left medial hallux without complications.Continue dressing changes daily.Offloading pads were also dispensed today.Monitor for any clinical signs or symptoms of infection and directed to call the office immediately should any occur or go to the ER. -RTC as scheduled or sooner if needed.   Celesta Gentile, DPM

## 2016-07-22 DIAGNOSIS — N35011 Post-traumatic bulbous urethral stricture: Secondary | ICD-10-CM | POA: Diagnosis not present

## 2016-07-22 DIAGNOSIS — R102 Pelvic and perineal pain: Secondary | ICD-10-CM | POA: Diagnosis not present

## 2016-07-30 MED FILL — FREESTYLE LITE TEST STRIP: 90 days supply | Qty: 200 | Fill #1

## 2016-07-30 MED FILL — UNIFINE PENTIPS 31GX3/16: 31G X 5 MM | 90 days supply | Qty: 100 | Fill #1

## 2016-07-30 MED FILL — PRASUGREL 10 MG TABLET: 10 | 90 days supply | Qty: 90 | Fill #0

## 2016-07-30 MED FILL — UNIFINE PENTIPS 31GX3/16": 31G X 5 MM | 90 days supply | Qty: 100 | Fill #1

## 2016-08-22 MED FILL — CIPROFLOXACIN HCL 500 MG TA: 500 | 7 days supply | Qty: 14 | Fill #0

## 2016-08-27 ENCOUNTER — Encounter: Payer: Self-pay | Admitting: Podiatry

## 2016-08-27 ENCOUNTER — Ambulatory Visit (INDEPENDENT_AMBULATORY_CARE_PROVIDER_SITE_OTHER): Payer: 59 | Admitting: Podiatry

## 2016-08-27 DIAGNOSIS — B351 Tinea unguium: Secondary | ICD-10-CM

## 2016-08-27 DIAGNOSIS — M79674 Pain in right toe(s): Secondary | ICD-10-CM | POA: Diagnosis not present

## 2016-08-27 DIAGNOSIS — E1149 Type 2 diabetes mellitus with other diabetic neurological complication: Secondary | ICD-10-CM

## 2016-08-27 DIAGNOSIS — L84 Corns and callosities: Secondary | ICD-10-CM

## 2016-08-27 DIAGNOSIS — M79675 Pain in left toe(s): Secondary | ICD-10-CM

## 2016-08-27 NOTE — Progress Notes (Signed)
Subjective: 61 y.o. returns the office today for painful, elongated, thickened toenails which he cannot trim himself. Denies any redness or drainage around the nails. He has presents today for follow-up evaluation of a pre-ulcerative callus to the left hallux. He states that the left foot is doing much better Denies any drainage or pus. Denies any acute changes since last appointment and no new complaints today. Denies any systemic complaints such as fevers, chills, nausea, vomiting.   Objective: AAO 3, NAD DP/PT pulses palpable, CRT less than 3 seconds Protective sensation decreased with Simms Weinstein monofilament Nails hypertrophic, dystrophic, elongated, brittle, discolored 10. There is tenderness overlying the nails 1-5 bilaterally. There is no surrounding erythema or drainage along the nail sites. On the medial aspect left hallux is a hyperkeratotic lesion with evidence of dried blood under the callus. Upon debridement there is no underlying ulceration, drainage or any clinical signs of infection. There area of the dried blood is much improved. There is no surrounding erythema, ascending cellulitis. There is no fluctuance, crepitus, malodor. Small hyperkeratic lesion right medial hallux. NO underlying ulceration, drainage, signs of infection.  No other areas of tenderness bilateral lower extremities. No overlying edema, erythema, increased warmth. No pain with calf compression, swelling, warmth, erythema.  Assessment: Patient presents with symptomatic onychomycosis; pre-ulcerative callus   Plan: -Treatment options including alternatives, risks, complications were discussed -Nails sharply debrided 10 without complication/bleeding. -Pre-ulcerative callus sharply debrided and left and right medial hallux without complications.Continue offloading daily. . Monitor for any clinical signs or symptoms of infection and directed to call the office immediately should any occur or go to the ER. -RTC  as scheduled or sooner if needed.   Celesta Gentile, DPM

## 2016-09-06 MED FILL — METFORMIN HCL ER 500 MG TAB: 500 | 90 days supply | Qty: 360 | Fill #0

## 2016-09-06 MED FILL — OMEGA-3 ETHYL ESTERS 1 GM C: 1 | 90 days supply | Qty: 360 | Fill #0

## 2016-09-06 MED FILL — SERTRALINE HCL 50 MG TABLET: 50 | 90 days supply | Qty: 180 | Fill #3

## 2016-09-06 MED FILL — LANTUS SOLOSTAR 100 UNITS/M: 100 | 90 days supply | Qty: 15 | Fill #3

## 2016-09-06 MED FILL — LYRICA 150 MG CAPSULE: 150 | 90 days supply | Qty: 180 | Fill #1

## 2016-09-06 MED FILL — traMADol HCL 50 MG TABS: 50 | 90 days supply | Qty: 720 | Fill #1

## 2016-09-06 MED FILL — METOPROLOL SUCC ER 25 MG TA: 25 | 90 days supply | Qty: 180 | Fill #0

## 2016-09-06 MED FILL — LISINOPRIL 40 MG TAB: 40 | 90 days supply | Qty: 90 | Fill #0

## 2016-09-06 MED FILL — METHOCARBAMOL 500 MG TABLET: 500 | 90 days supply | Qty: 270 | Fill #2

## 2016-09-06 MED FILL — raNITIdine HCL 300 MG TABS: 300 | 90 days supply | Qty: 90 | Fill #0

## 2016-09-06 MED FILL — ZOLPIDEM TARTRATE 10 MG TAB: 10 | 90 days supply | Qty: 90 | Fill #0

## 2016-09-10 DIAGNOSIS — E1140 Type 2 diabetes mellitus with diabetic neuropathy, unspecified: Secondary | ICD-10-CM | POA: Diagnosis not present

## 2016-09-10 DIAGNOSIS — Z6839 Body mass index (BMI) 39.0-39.9, adult: Secondary | ICD-10-CM | POA: Diagnosis not present

## 2016-09-10 DIAGNOSIS — I10 Essential (primary) hypertension: Secondary | ICD-10-CM | POA: Diagnosis not present

## 2016-09-24 DIAGNOSIS — N3000 Acute cystitis without hematuria: Secondary | ICD-10-CM | POA: Diagnosis not present

## 2016-09-24 DIAGNOSIS — L0100 Impetigo, unspecified: Secondary | ICD-10-CM | POA: Diagnosis not present

## 2016-09-24 DIAGNOSIS — Z23 Encounter for immunization: Secondary | ICD-10-CM | POA: Diagnosis not present

## 2016-09-30 MED FILL — JARDIANCE 25 MG TABLET: 25 | 90 days supply | Qty: 90 | Fill #0

## 2016-10-02 DIAGNOSIS — F1320 Sedative, hypnotic or anxiolytic dependence, uncomplicated: Secondary | ICD-10-CM | POA: Diagnosis not present

## 2016-10-02 DIAGNOSIS — E1151 Type 2 diabetes mellitus with diabetic peripheral angiopathy without gangrene: Secondary | ICD-10-CM | POA: Diagnosis not present

## 2016-10-02 DIAGNOSIS — M545 Low back pain: Secondary | ICD-10-CM | POA: Diagnosis not present

## 2016-10-02 DIAGNOSIS — E784 Other hyperlipidemia: Secondary | ICD-10-CM | POA: Diagnosis not present

## 2016-10-02 DIAGNOSIS — E11621 Type 2 diabetes mellitus with foot ulcer: Secondary | ICD-10-CM | POA: Diagnosis not present

## 2016-10-02 DIAGNOSIS — E1140 Type 2 diabetes mellitus with diabetic neuropathy, unspecified: Secondary | ICD-10-CM | POA: Diagnosis not present

## 2016-10-02 DIAGNOSIS — G4700 Insomnia, unspecified: Secondary | ICD-10-CM | POA: Diagnosis not present

## 2016-10-02 DIAGNOSIS — I10 Essential (primary) hypertension: Secondary | ICD-10-CM | POA: Diagnosis not present

## 2016-10-02 DIAGNOSIS — G629 Polyneuropathy, unspecified: Secondary | ICD-10-CM | POA: Diagnosis not present

## 2016-10-02 MED FILL — CLOTRIMAZOLE-BETAMETHASONE: 1-0.05 | 20 days supply | Qty: 30 | Fill #0

## 2016-10-15 ENCOUNTER — Ambulatory Visit (INDEPENDENT_AMBULATORY_CARE_PROVIDER_SITE_OTHER): Payer: 59 | Admitting: Podiatry

## 2016-10-15 DIAGNOSIS — B351 Tinea unguium: Secondary | ICD-10-CM | POA: Diagnosis not present

## 2016-10-15 DIAGNOSIS — M79674 Pain in right toe(s): Secondary | ICD-10-CM

## 2016-10-15 DIAGNOSIS — M79675 Pain in left toe(s): Secondary | ICD-10-CM | POA: Diagnosis not present

## 2016-10-15 DIAGNOSIS — E1149 Type 2 diabetes mellitus with other diabetic neurological complication: Secondary | ICD-10-CM | POA: Diagnosis not present

## 2016-10-15 DIAGNOSIS — L84 Corns and callosities: Secondary | ICD-10-CM

## 2016-10-15 NOTE — Progress Notes (Signed)
Subjective: 61 y.o. returns the office today for painful, elongated, thickened toenails which he cannot trim himself. Denies any redness or drainage around the nails. He also gets a callus left big toe a states he is doing well. He denies any swelling or redness or any drainage. Denies any acute changes since last appointment and no new complaints today. Denies any systemic complaints such as fevers, chills, nausea, vomiting.   Objective: AAO 3, NAD DP/PT pulses palpable, CRT less than 3 seconds Protective sensation decreased with Simms Weinstein monofilament Nails hypertrophic, dystrophic, elongated, brittle, discolored 10. There is tenderness overlying the nails 1-5 bilaterally. There is no surrounding erythema or drainage along the nail sites. Hyperkeratotic lesion left medial hallux. Upon debridement there is no ongoing ulceration, drainage or any signs of infection area patient be healed. No open lesions or pre-ulcerative lesions are identified. No other areas of tenderness bilateral lower extremities. No overlying edema, erythema, increased warmth. No pain with calf compression, swelling, warmth, erythema.  Assessment: Patient presents with symptomatic onychomycosis  Plan: -Treatment options including alternatives, risks, complications were discussed -Nails sharply debrided 10 without complication/bleeding. -Hyperkeratotic lesion sharply debrided 1 without complications or bleeding. -Discussed daily foot inspection. If there are any changes, to call the office immediately.  -Follow-up in 9 weeks or sooner if any problems are to arise. In the meantime, encouraged to call the office with any questions, concerns, changes symptoms.  Celesta Gentile, DPM

## 2016-10-28 MED FILL — CEFDINIR 300 MG CAPSULE: 300 | 7 days supply | Qty: 14 | Fill #0

## 2016-10-29 MED FILL — UNIFINE PENTIPS 31GX3/16": 31G X 5 MM | 90 days supply | Qty: 100 | Fill #0

## 2016-10-29 MED FILL — FREESTYLE LITE TEST STRIP: 90 days supply | Qty: 200 | Fill #2

## 2016-10-29 MED FILL — UNIFINE PENTIPS 31GX3/16: 31G X 5 MM | 90 days supply | Qty: 100 | Fill #0

## 2016-10-29 MED FILL — PRASUGREL 10 MG TABLET: 10 | 90 days supply | Qty: 90 | Fill #1

## 2016-11-07 DIAGNOSIS — N35011 Post-traumatic bulbous urethral stricture: Secondary | ICD-10-CM | POA: Diagnosis not present

## 2016-11-13 DIAGNOSIS — N359 Urethral stricture, unspecified: Secondary | ICD-10-CM | POA: Diagnosis not present

## 2016-11-13 DIAGNOSIS — R339 Retention of urine, unspecified: Secondary | ICD-10-CM | POA: Diagnosis not present

## 2016-12-03 MED FILL — METFORMIN HCL ER 500 MG TAB: 500 | 90 days supply | Qty: 360 | Fill #1

## 2016-12-03 MED FILL — LYRICA 150 MG CAPSULE: 150 | 90 days supply | Qty: 180 | Fill #0

## 2016-12-03 MED FILL — ZOLPIDEM TARTRATE 10 MG TAB: 10 | 90 days supply | Qty: 90 | Fill #1

## 2016-12-03 MED FILL — SERTRALINE HCL 50 MG TABLET: 50 | 90 days supply | Qty: 180 | Fill #0

## 2016-12-03 MED FILL — METOPROLOL SUCC ER 25 MG TA: 25 | 90 days supply | Qty: 180 | Fill #1

## 2016-12-03 MED FILL — traMADol HCL 50 MG TABS: 50 | 90 days supply | Qty: 720 | Fill #0

## 2016-12-03 MED FILL — raNITIdine HCL 300 MG TABS: 300 | 90 days supply | Qty: 90 | Fill #1

## 2016-12-03 MED FILL — OMEGA-3 ETHYL ESTERS 1 GM C: 1 | 90 days supply | Qty: 360 | Fill #1

## 2016-12-03 MED FILL — LISINOPRIL 40 MG TABLET: 40 | 90 days supply | Qty: 90 | Fill #1

## 2016-12-03 MED FILL — METHOCARBAMOL 500 MG TABS: 500 | 90 days supply | Qty: 270 | Fill #0

## 2016-12-03 MED FILL — LANTUS SOLOSTAR 100 UNITS/M: 100 | 60 days supply | Qty: 15 | Fill #0

## 2016-12-17 ENCOUNTER — Ambulatory Visit (INDEPENDENT_AMBULATORY_CARE_PROVIDER_SITE_OTHER): Payer: 59 | Admitting: Podiatry

## 2016-12-17 ENCOUNTER — Encounter: Payer: Self-pay | Admitting: Podiatry

## 2016-12-17 DIAGNOSIS — M79675 Pain in left toe(s): Secondary | ICD-10-CM | POA: Diagnosis not present

## 2016-12-17 DIAGNOSIS — M79674 Pain in right toe(s): Secondary | ICD-10-CM

## 2016-12-17 DIAGNOSIS — E1149 Type 2 diabetes mellitus with other diabetic neurological complication: Secondary | ICD-10-CM

## 2016-12-17 DIAGNOSIS — B351 Tinea unguium: Secondary | ICD-10-CM | POA: Diagnosis not present

## 2016-12-17 DIAGNOSIS — L84 Corns and callosities: Secondary | ICD-10-CM

## 2016-12-17 NOTE — Progress Notes (Signed)
Subjective: 61 y.o. returns the office today for painful, elongated, thickened toenails which he cannot trim himself. Denies any redness or drainage around the nails. He also gets a callus left big toe a states he is doing well and he has not has any blood spots. He also has a callus in the right submetatarsal 5 area which he can feel when he walks. He denies any swelling or redness or any drainage. Denies any acute changes since last appointment and no new complaints today. Denies any systemic complaints such as fevers, chills, nausea, vomiting.   Objective: AAO 3, NAD DP/PT pulses palpable, CRT less than 3 seconds Protective sensation decreased with Simms Weinstein monofilament Nails hypertrophic, dystrophic, elongated, brittle, discolored 10. There is tenderness overlying the nails 1-5 bilaterally. There is no surrounding erythema or drainage along the nail sites. Hyperkeratotic lesion left medial hallux. Upon debridement there is no ongoing ulceration, drainage or any signs of infection area patient be healed. Hyperkeratotic lesion right cementless metatarsal 5. No underlying ulceration, drainage or any signs of infection present. No open lesions or pre-ulcerative lesions are identified. No other areas of tenderness bilateral lower extremities. No overlying edema, erythema, increased warmth. No pain with calf compression, swelling, warmth, erythema.  Assessment: Patient presents with symptomatic onychomycosis; hyperkeratotic lesions  Plan: -Treatment options including alternatives, risks, complications were discussed -Nails sharply debrided 10 without complication/bleeding. -Hyperkeratotic lesion sharply debrided 2 without complications or bleeding. -Discussed daily foot inspection. If there are any changes, to call the office immediately.  -Follow-up in 9 weeks or sooner if any problems are to arise. In the meantime, encouraged to call the office with any questions, concerns, changes  symptoms.  Celesta Gentile, DPM

## 2017-01-06 MED FILL — JARDIANCE 25 MG TABLET: 25 | 90 days supply | Qty: 90 | Fill #1

## 2017-01-15 DIAGNOSIS — Z794 Long term (current) use of insulin: Secondary | ICD-10-CM | POA: Diagnosis not present

## 2017-01-15 DIAGNOSIS — I10 Essential (primary) hypertension: Secondary | ICD-10-CM | POA: Diagnosis not present

## 2017-01-15 DIAGNOSIS — Z6839 Body mass index (BMI) 39.0-39.9, adult: Secondary | ICD-10-CM | POA: Diagnosis not present

## 2017-01-15 DIAGNOSIS — E1140 Type 2 diabetes mellitus with diabetic neuropathy, unspecified: Secondary | ICD-10-CM | POA: Diagnosis not present

## 2017-01-27 MED FILL — PRASUGREL 10 MG TABLET: 10 | 90 days supply | Qty: 90 | Fill #2

## 2017-02-14 MED FILL — UNIFINE PENTIPS 31GX3/16: 31G X 5 MM | 90 days supply | Qty: 100 | Fill #1

## 2017-02-14 MED FILL — UNIFINE PENTIPS 31GX3/16": 31G X 5 MM | 90 days supply | Qty: 100 | Fill #1

## 2017-02-27 MED FILL — raNITIdine HCL 300 MG TABS: 300 | 90 days supply | Qty: 90 | Fill #2

## 2017-02-27 MED FILL — SERTRALINE HCL 50 MG TABLET: 50 | 90 days supply | Qty: 180 | Fill #1

## 2017-02-27 MED FILL — LISINOPRIL 40 MG TABLET: 40 | 90 days supply | Qty: 90 | Fill #2

## 2017-02-27 MED FILL — LANTUS SOLOSTAR 100 UNITS/M: 100 | 60 days supply | Qty: 15 | Fill #1

## 2017-02-27 MED FILL — METHOCARBAMOL 500 MG TABS: 500 | 90 days supply | Qty: 270 | Fill #1

## 2017-02-27 MED FILL — METOPROLOL SUCC ER 25 MG TA: 25 | 90 days supply | Qty: 180 | Fill #2

## 2017-02-27 MED FILL — OMEGA-3 ETHYL ESTERS 1 GM C: 1 | 90 days supply | Qty: 360 | Fill #2

## 2017-02-27 MED FILL — METFORMIN HCL ER 500 MG TAB: 500 | 90 days supply | Qty: 360 | Fill #2

## 2017-02-27 MED FILL — LYRICA 150 MG CAPSULE: 150 | 90 days supply | Qty: 180 | Fill #1

## 2017-03-03 MED FILL — ZOLPIDEM TARTRATE 10 MG TAB: 10 | 90 days supply | Qty: 90 | Fill #0

## 2017-03-03 MED FILL — traMADol HCL 50 MG TABS: 50 | 90 days supply | Qty: 720 | Fill #1

## 2017-03-12 DIAGNOSIS — N3000 Acute cystitis without hematuria: Secondary | ICD-10-CM | POA: Diagnosis not present

## 2017-03-12 DIAGNOSIS — N35011 Post-traumatic bulbous urethral stricture: Secondary | ICD-10-CM | POA: Diagnosis not present

## 2017-03-12 DIAGNOSIS — R3912 Poor urinary stream: Secondary | ICD-10-CM | POA: Diagnosis not present

## 2017-03-12 MED FILL — CEPHALEXIN 500 MG CAPSULE: 500 | 7 days supply | Qty: 14 | Fill #0

## 2017-03-18 DIAGNOSIS — H52201 Unspecified astigmatism, right eye: Secondary | ICD-10-CM | POA: Diagnosis not present

## 2017-03-18 DIAGNOSIS — H524 Presbyopia: Secondary | ICD-10-CM | POA: Diagnosis not present

## 2017-03-18 DIAGNOSIS — H5212 Myopia, left eye: Secondary | ICD-10-CM | POA: Diagnosis not present

## 2017-04-01 MED FILL — JARDIANCE 25 MG TABLET: 25 | 90 days supply | Qty: 90 | Fill #0 | Status: TO

## 2017-04-02 DIAGNOSIS — R339 Retention of urine, unspecified: Secondary | ICD-10-CM | POA: Diagnosis not present

## 2017-04-02 DIAGNOSIS — N35919 Unspecified urethral stricture, male, unspecified site: Secondary | ICD-10-CM | POA: Diagnosis not present

## 2017-04-02 MED FILL — CIPROFLOXACIN HCL 500 MG TA: 500 | 7 days supply | Qty: 14 | Fill #0

## 2017-04-11 ENCOUNTER — Encounter: Payer: Self-pay | Admitting: Podiatry

## 2017-04-11 ENCOUNTER — Ambulatory Visit (INDEPENDENT_AMBULATORY_CARE_PROVIDER_SITE_OTHER): Payer: 59 | Admitting: Podiatry

## 2017-04-11 DIAGNOSIS — E1149 Type 2 diabetes mellitus with other diabetic neurological complication: Secondary | ICD-10-CM | POA: Diagnosis not present

## 2017-04-11 DIAGNOSIS — L600 Ingrowing nail: Secondary | ICD-10-CM

## 2017-04-11 DIAGNOSIS — L84 Corns and callosities: Secondary | ICD-10-CM | POA: Diagnosis not present

## 2017-04-11 NOTE — Progress Notes (Signed)
Subjective: Spencer [name] presents the office today for concerns of a painful pre-ulcerative callus on left big toe.  States he has not been here some time as he delayed calling to get an appointment.  He states the left big toe is painful with pressure in shoes but denies any redness or drainage or any swelling.  As it has been sometime since I saw him last his wife to trim the toenails herself 2 weeks ago.  He says the corners of the right big toe occasionally get discomfort however there is no swelling or redness or drainage.  No other concerns today. Denies any systemic complaints such as fevers, chills, nausea, vomiting. No acute changes since last appointment, and no other complaints at this time.   Objective: AAO x3, NAD DP/PT pulses palpable bilaterally, CRT less than 3 seconds Thick hyperkeratotic lesion left medial hallux IPJ.  There is evidence of dried blood within the callus.  Upon debridement there was no underlying ulceration, drainage or any clinical signs of infection noted today.   Nails are recently caught her there is mild incurvation along both the medial lateral aspects of the right hallux toenail.  Upon debridement there is no drainage or pus or any clinical signs of infection there is resolution of discomfort. No open lesions or pre-ulcerative lesions.  No pain with calf compression, swelling, warmth, erythema  Assessment: Pre-ulcerative callus left hallux, right hallux ingrown toenail  Plan: -All treatment options discussed with the patient including all alternatives, risks, complications.  -Debrided the hyperkeratotic lesion left foot without any complications or bleeding.  The area is pre-ulcerative.  Offloading pads were dispensed.  Discussed to monitor daily for any skin breakdown. -I debrided the right hallux toenail without any complications or bleeding to remove the symptomatic portion of ingrown toenails.  We can discuss partial nail avulsion. -Patient encouraged to call  the office with any questions, concerns, change in symptoms.  -RTC 4 weeks or sooner if needed.  Trula Slade DPM

## 2017-04-24 MED FILL — PRASUGREL 10 MG TABLET: 10 | 90 days supply | Qty: 90 | Fill #3 | Status: TO

## 2017-04-24 MED FILL — LANTUS SOLOSTAR 100 UNITS/M: 100 | 60 days supply | Qty: 15 | Fill #2

## 2017-05-13 ENCOUNTER — Encounter: Payer: Self-pay | Admitting: Podiatry

## 2017-05-13 ENCOUNTER — Ambulatory Visit (INDEPENDENT_AMBULATORY_CARE_PROVIDER_SITE_OTHER): Payer: 59 | Admitting: Podiatry

## 2017-05-13 DIAGNOSIS — M79675 Pain in left toe(s): Secondary | ICD-10-CM

## 2017-05-13 DIAGNOSIS — L84 Corns and callosities: Secondary | ICD-10-CM

## 2017-05-13 DIAGNOSIS — B351 Tinea unguium: Secondary | ICD-10-CM

## 2017-05-13 DIAGNOSIS — E1149 Type 2 diabetes mellitus with other diabetic neurological complication: Secondary | ICD-10-CM | POA: Diagnosis not present

## 2017-05-13 DIAGNOSIS — M79674 Pain in right toe(s): Secondary | ICD-10-CM

## 2017-05-13 NOTE — Progress Notes (Signed)
Subjective: Spencer [name] presents the office today for concerns of a painful pre-ulcerative callus on left big toe and for painful, elongated toenails that get ingrown, mostly the hallux toenails. Denies any redness, drainage, or signs of infection. He recently went to [employer]. Denies any systemic complaints such as fevers, chills, nausea, vomiting. No acute changes since last appointment, and no other complaints at this time.   Objective: AAO x3, NAD DP/PT pulses palpable bilaterally, CRT less than 3 seconds Thick hyperkeratotic lesion left medial hallux IPJ.  There is evidence of dried blood within the callus.  Upon debridement there was no underlying ulceration, drainage or any clinical signs of infection noted today.   Nails are hypertrophic, dystropic, discolored with yellow discoloration without any surrounding erythema, edema or signs of infection. There is mild incurvation of both the medial and lateral corners. There is mild tenderness to the distal corners but not along the proximal aspects.  No open lesions or pre-ulcerative lesions.  No pain with calf compression, swelling, warmth, erythema  Assessment: Pre-ulcerative callus left hallux, hallux ingrown toenail  Plan: -All treatment options discussed with the patient including all alternatives, risks, complications.  -Debrided the hyperkeratotic lesion left foot without any complications or bleeding.  The area is pre-ulcerative.  Offloading pads were dispensed.  Discussed to monitor daily for any skin breakdown. -Nails sharply debrided x 10 without any complications or bleeding.  -Daily foot inspection -RTC 6 weeks (at his request) or sooner if needed.  Trula Slade DPM

## 2017-05-19 DIAGNOSIS — E7849 Other hyperlipidemia: Secondary | ICD-10-CM | POA: Diagnosis not present

## 2017-05-19 DIAGNOSIS — Z1389 Encounter for screening for other disorder: Secondary | ICD-10-CM | POA: Diagnosis not present

## 2017-05-19 DIAGNOSIS — I2510 Atherosclerotic heart disease of native coronary artery without angina pectoris: Secondary | ICD-10-CM | POA: Diagnosis not present

## 2017-05-19 DIAGNOSIS — E1140 Type 2 diabetes mellitus with diabetic neuropathy, unspecified: Secondary | ICD-10-CM | POA: Diagnosis not present

## 2017-05-19 DIAGNOSIS — I10 Essential (primary) hypertension: Secondary | ICD-10-CM | POA: Diagnosis not present

## 2017-05-19 DIAGNOSIS — Z0000 Encounter for general adult medical examination without abnormal findings: Secondary | ICD-10-CM | POA: Diagnosis not present

## 2017-05-19 DIAGNOSIS — E1151 Type 2 diabetes mellitus with diabetic peripheral angiopathy without gangrene: Secondary | ICD-10-CM | POA: Diagnosis not present

## 2017-05-19 DIAGNOSIS — G629 Polyneuropathy, unspecified: Secondary | ICD-10-CM | POA: Diagnosis not present

## 2017-05-19 DIAGNOSIS — Z125 Encounter for screening for malignant neoplasm of prostate: Secondary | ICD-10-CM | POA: Diagnosis not present

## 2017-05-19 DIAGNOSIS — M545 Low back pain: Secondary | ICD-10-CM | POA: Diagnosis not present

## 2017-05-19 DIAGNOSIS — Z23 Encounter for immunization: Secondary | ICD-10-CM | POA: Diagnosis not present

## 2017-05-19 DIAGNOSIS — R82998 Other abnormal findings in urine: Secondary | ICD-10-CM | POA: Diagnosis not present

## 2017-05-23 MED FILL — OMEGA-3 ETHYL ESTERS 1 GM C: 1 | 90 days supply | Qty: 360 | Fill #0 | Status: TO

## 2017-05-23 MED FILL — LISINOPRIL 40 MG TABLET: 40 | 90 days supply | Qty: 90 | Fill #3 | Status: TO

## 2017-05-23 MED FILL — UNIFINE PENTIPS 31GX3/16: 31G X 5 MM | 90 days supply | Qty: 100 | Fill #2 | Status: TO

## 2017-05-23 MED FILL — FREESTYLE LITE TEST STRIP: 90 days supply | Qty: 200 | Fill #0 | Status: TO

## 2017-05-23 MED FILL — SERTRALINE HCL 50 MG TABLET: 50 | 90 days supply | Qty: 180 | Fill #2 | Status: TO

## 2017-05-23 MED FILL — METFORMIN HCL ER 500 MG TAB: 500 | 90 days supply | Qty: 360 | Fill #3 | Status: TO

## 2017-05-23 MED FILL — METOPROLOL SUCCINATE ER 25: 25 | 90 days supply | Qty: 180 | Fill #3

## 2017-05-23 MED FILL — UNIFINE PENTIPS 31GX3/16": 31G X 5 MM | 90 days supply | Qty: 100 | Fill #2 | Status: TO

## 2017-05-23 MED FILL — raNITIdine HCL 300 MG TABS: 300 | 90 days supply | Qty: 90 | Fill #3 | Status: TO

## 2017-05-26 MED FILL — LYRICA 150 MG CAPSULE: 150 | 90 days supply | Qty: 180 | Fill #0

## 2017-05-27 MED FILL — METHOCARBAMOL 500 MG TABS: 500 | 90 days supply | Qty: 270 | Fill #2 | Status: TO

## 2017-05-30 MED FILL — ZOLPIDEM TARTRATE 10 MG TAB: 10 | 90 days supply | Qty: 90 | Fill #1 | Status: TO

## 2017-05-30 MED FILL — traMADol HCL 50 MG TABS: 50 | 90 days supply | Qty: 720 | Fill #0 | Status: TO

## 2017-06-10 DIAGNOSIS — Z1212 Encounter for screening for malignant neoplasm of rectum: Secondary | ICD-10-CM | POA: Diagnosis not present

## 2017-06-16 DIAGNOSIS — N35011 Post-traumatic bulbous urethral stricture: Secondary | ICD-10-CM | POA: Diagnosis not present

## 2017-06-18 DIAGNOSIS — I10 Essential (primary) hypertension: Secondary | ICD-10-CM | POA: Diagnosis not present

## 2017-06-18 DIAGNOSIS — Z6838 Body mass index (BMI) 38.0-38.9, adult: Secondary | ICD-10-CM | POA: Diagnosis not present

## 2017-06-18 DIAGNOSIS — Z794 Long term (current) use of insulin: Secondary | ICD-10-CM | POA: Diagnosis not present

## 2017-06-18 DIAGNOSIS — E1140 Type 2 diabetes mellitus with diabetic neuropathy, unspecified: Secondary | ICD-10-CM | POA: Diagnosis not present

## 2017-06-23 MED FILL — LANTUS SOLOSTAR 100 UNITS/M: 100 | 84 days supply | Qty: 21 | Fill #0 | Status: TO

## 2017-06-24 ENCOUNTER — Encounter: Payer: Self-pay | Admitting: Podiatry

## 2017-06-24 ENCOUNTER — Ambulatory Visit (INDEPENDENT_AMBULATORY_CARE_PROVIDER_SITE_OTHER): Payer: 59 | Admitting: Podiatry

## 2017-06-24 ENCOUNTER — Telehealth: Payer: Self-pay | Admitting: *Deleted

## 2017-06-24 DIAGNOSIS — M79674 Pain in right toe(s): Secondary | ICD-10-CM

## 2017-06-24 DIAGNOSIS — L600 Ingrowing nail: Secondary | ICD-10-CM | POA: Diagnosis not present

## 2017-06-24 DIAGNOSIS — R0989 Other specified symptoms and signs involving the circulatory and respiratory systems: Secondary | ICD-10-CM

## 2017-06-24 DIAGNOSIS — M79675 Pain in left toe(s): Secondary | ICD-10-CM

## 2017-06-24 DIAGNOSIS — I739 Peripheral vascular disease, unspecified: Secondary | ICD-10-CM

## 2017-06-24 DIAGNOSIS — E1149 Type 2 diabetes mellitus with other diabetic neurological complication: Secondary | ICD-10-CM

## 2017-06-24 DIAGNOSIS — B351 Tinea unguium: Secondary | ICD-10-CM

## 2017-06-24 DIAGNOSIS — Q828 Other specified congenital malformations of skin: Secondary | ICD-10-CM

## 2017-06-24 NOTE — Telephone Encounter (Signed)
-----   Message from Trula Slade, DPM sent at 06/24/2017  9:21 AM EDT ----- Can you please order an arterial duplex due to abnormal ABI in the office and decreased pulses? Thanks.

## 2017-06-24 NOTE — Telephone Encounter (Signed)
Orders faxed to [facility]. 

## 2017-06-24 NOTE — Patient Instructions (Signed)
Today your pulses to your feet were decreased. I did an ABI (ankle brachial index) in the office which was abnormal. Due to this I am going to order an arterial duplex to look at your circulation in better detail.

## 2017-06-26 NOTE — Progress Notes (Signed)
Subjective: Spencer [name] presents the office today for concerns of a painful pre-ulcerative callus on left big toe and for painful, elongated toenails that get ingrown, mostly the hallux toenails.  He states that the toenails are continue to be ingrown he would like to go ahead and have both corners of both big toenails removed.  He states the wounds have been removed to the ongoing pain.  He has no other concerns today.  Objective: AAO x3, NAD DP/PT pulses palpable however decreased today, CRT less than 3 seconds Thick hyperkeratotic lesion left medial hallux IPJ.  There is evidence of dried blood within the callus.  Upon debridement there was no underlying ulceration, drainage or any clinical signs of infection noted today.  The callus to be somewhat larger today but there is no underlying ulceration. Nails are hypertrophic, dystropic, discolored with yellow discoloration without any surrounding erythema, edema or signs of infection. There is mild incurvation of both the medial and lateral corners. There is mild tenderness to the distal corners but not along the proximal aspects.  No open lesions or pre-ulcerative lesions.  No pain with calf compression, swelling, warmth, erythema  Assessment: Pre-ulcerative callus left hallux, hallux ingrown toenail, PAD  Plan: -All treatment options discussed with the patient including all alternatives, risks, complications.  -Nail avulsion.  However today on exam his pulses appear to be decreased compared to what it has been side the ABI which should come back abnormal.  Because this I ordered arterial duplex and hold off on the procedure as there is no signs of infection and he agrees with this. -Debrided the hyperkeratotic lesion left foot without any complications or bleeding.  The area is pre-ulcerative.  Offloading pads were dispensed.  Discussed to monitor daily for any skin breakdown. -Nails sharply debrided x 10 without any complications or bleeding.  -Daily  foot inspection -RTC 6 weeks  or sooner if needed.  Trula Slade DPM

## 2017-07-04 ENCOUNTER — Other Ambulatory Visit: Payer: Self-pay | Admitting: Podiatry

## 2017-07-04 DIAGNOSIS — R0989 Other specified symptoms and signs involving the circulatory and respiratory systems: Secondary | ICD-10-CM

## 2017-07-08 MED FILL — JARDIANCE 25 MG TABLET: 25 | 90 days supply | Qty: 90 | Fill #0 | Status: TO

## 2017-07-10 ENCOUNTER — Ambulatory Visit (HOSPITAL_COMMUNITY)
Admission: RE | Admit: 2017-07-10 | Discharge: 2017-07-10 | Disposition: A | Discharge status: Home / Self Care | Payer: 59 | Source: Ambulatory Visit | Attending: Internal Medicine | Admitting: Internal Medicine

## 2017-07-10 DIAGNOSIS — I771 Stricture of artery: Secondary | ICD-10-CM | POA: Insufficient documentation

## 2017-07-10 DIAGNOSIS — I70203 Unspecified atherosclerosis of native arteries of extremities, bilateral legs: Secondary | ICD-10-CM | POA: Insufficient documentation

## 2017-07-10 DIAGNOSIS — R0989 Other specified symptoms and signs involving the circulatory and respiratory systems: Secondary | ICD-10-CM | POA: Diagnosis not present

## 2017-07-11 ENCOUNTER — Telehealth: Payer: Self-pay | Admitting: *Deleted

## 2017-07-11 NOTE — Telephone Encounter (Signed)
-----   Message from Trula Slade, DPM sent at 07/10/2017  9:05 PM EDT ----- Val- please let him know that his study was abnormal and he should keep his appointment with Dr. Fletcher Anon. Thanks.

## 2017-07-11 NOTE — Telephone Encounter (Signed)
Pt' s wife, Neoma Laming states pt is napping. I told her to make sure pt keep his appt with Dr. Fletcher Anon and she stated he would.

## 2017-07-22 ENCOUNTER — Encounter: Payer: Self-pay | Admitting: Cardiovascular Disease

## 2017-07-22 ENCOUNTER — Other Ambulatory Visit: Payer: Self-pay | Admitting: Cardiovascular Disease

## 2017-07-22 ENCOUNTER — Ambulatory Visit (INDEPENDENT_AMBULATORY_CARE_PROVIDER_SITE_OTHER): Payer: 59 | Admitting: Cardiovascular Disease

## 2017-07-22 VITALS — BP 156/72 | HR 66 | Ht 73.0 in | Wt 290.2 lb

## 2017-07-22 DIAGNOSIS — I10 Essential (primary) hypertension: Secondary | ICD-10-CM | POA: Diagnosis not present

## 2017-07-22 DIAGNOSIS — E785 Hyperlipidemia, unspecified: Secondary | ICD-10-CM | POA: Diagnosis not present

## 2017-07-22 DIAGNOSIS — I6523 Occlusion and stenosis of bilateral carotid arteries: Secondary | ICD-10-CM

## 2017-07-22 DIAGNOSIS — I779 Disorder of arteries and arterioles, unspecified: Secondary | ICD-10-CM | POA: Diagnosis not present

## 2017-07-22 DIAGNOSIS — I739 Peripheral vascular disease, unspecified: Secondary | ICD-10-CM

## 2017-07-22 DIAGNOSIS — I2510 Atherosclerotic heart disease of native coronary artery without angina pectoris: Secondary | ICD-10-CM | POA: Diagnosis not present

## 2017-07-22 MED FILL — PRASUGREL 10 MG TABLET: 10 | 90 days supply | Qty: 90 | Fill #0

## 2017-07-22 NOTE — Patient Instructions (Signed)
Medication Instructions: Your physician recommends that you continue on your current medications as directed. Please refer to the Current Medication list given to you today.  If you need a refill on your cardiac medications before your next appointment, please call your pharmacy.   Labwork: None  Procedures/Testing: Your physician has requested that you have a carotid duplex. This test is an ultrasound of the carotid arteries in your neck. It looks at blood flow through these arteries that supply the brain with blood. Allow one hour for this exam. There are no restrictions or special instructions. This will take place at [address], [address].   Follow-Up: Your physician wants you to follow-up in 3 months with Dr. Fletcher Anon. You will receive a reminder letter in the mail two months in advance. If you don't receive a letter, please call our office at [phone] to schedule this follow-up appointment.   Thank you for choosing [facility] at [facility]!!

## 2017-07-22 NOTE — Progress Notes (Signed)
Cardiology Office Note   Date:  07/22/2017   ID:  Spencer [name], DOB 21-Dec-1955, MRN [id number]  PCP:  Burnard Bunting, MD  Cardiologist:   Kathlyn Sacramento, MD   No chief complaint on file.     History of Present Illness: Spencer [name] is a 62 y.o. male who was referred by Dr. Jacqualyn Posey for evaluation and management of peripheral arterial disease.  He is to be followed in our office by Dr. Mare Ferrari and most recently seen in 2015.  He has known history of coronary artery disease status post CABG in 2012, mild ischemic cardiomyopathy with an EF of 45%, diabetes mellitus, bilateral carotid disease, obesity hyperlipidemia with intolerance to statins and recently diagnosed peripheral arterial disease.  He is status post PCI and drug-eluting stent placement to both SVG to OM and SVG to RCA in 2015. He is being followed by Dr. Jacqualyn Posey for pre-ulcerative callus on the left big toe and painful elongated toenails.  He underwent noninvasive vascular studies.  ABI were not reliable due to calcifications.  Duplex showed moderate bilateral SFA disease.  There was two-vessel runoff on the right side.  On the left side, no flow was detected in the anterior tibial, posterior tibial or peroneal arteries.  He has been doing reasonably well from a cardiac standpoint with no recent chest pain or shortness of breath.  He denies leg claudication.  He has known history of recurrent ingrown toenails.  He also has a callus on the left big toe.   Past Medical History:  Diagnosis Date  . Anxiety attack   . Arthritis    "back; shoulders; knees" (07/23/2013)  . Back pain, chronic    "all over" (07/23/2013)  . Benign neoplasm of colon 04/01/2000   Hyperplastic  . Coronary artery disease    a. NSTEMI 10/12: s/p CABG with L-LAD, S-OM1/OM2, S-RV marg/RCA;  b. 07/2013 Cath/PCI: LM <10, LAD 70-80p, 66m, D1 90, RI 30ost, LCX 40-50p, 6m, OM1 90, OM2 100, RCA 100p, VG->OM1->OM2 patent with 90 @ anast (2.5x12 Xience Alpine  DES), RCA 100p, LIMA->LAD nl, VG->Diag nl, VG->RV marginal/RCA 90p (3.0x23 Xience Alpine DES);  c. 07/2013 Echo: EF 55-60%.  . Depression    "pain related"  . DM2 (diabetes mellitus, type 2) (Spencer [name])   . GERD (gastroesophageal reflux disease)   . Hyperlipidemia   . Hypertension   . IBS (irritable bowel syndrome)   . Knee pain, bilateral    a. since 01/2013.  . Morbidly obese ([place])   . Myocardial infarction ([name]) 2012  . Obesity   . Pleural effusion, left 11/2010   Postop; resolved with p.o. diuresis    Past Surgical History:  Procedure Laterality Date  . BACK SURGERY    . CARDIAC CATHETERIZATION  11/2010  . CARPAL TUNNEL RELEASE Bilateral   . CORONARY ANGIOPLASTY WITH STENT PLACEMENT  07/23/2013   "2"  . CORONARY ARTERY BYPASS GRAFT  11/26/10   x 6 SURGEON DR. PETER VAN TRIGT  . HERNIA REPAIR     UHR  . LEFT HEART CATHETERIZATION WITH CORONARY/GRAFT ANGIOGRAM N/A 07/23/2013   Procedure: LEFT HEART CATHETERIZATION WITH Beatrix Fetters;  Surgeon: Peter M Martinique, MD;  Location: [facility] CATH LAB;  Service: Cardiovascular;  Laterality: N/A;  . LUMBAR FUSION     MULTIPLE; DR. Ellene Route  . SHOULDER ARTHROSCOPY W/ ROTATOR CUFF REPAIR Bilateral   . UMBILICAL HERNIA REPAIR       Current Outpatient Medications  Medication Sig Dispense Refill  . Docusate  Calcium (STOOL SOFTENER PO) Take 1 capsule by mouth daily.    . Probiotic Product (PROBIOTIC DAILY) CAPS Take 1 capsule by mouth daily.    . sertraline (ZOLOFT) 50 MG tablet Take 50 mg by mouth 2 (two) times daily.      . Ascorbic Acid (VITAMIN C) 1000 MG tablet Take 1,000 mg by mouth daily.    Marland Kitchen aspirin EC 81 MG tablet Take 81 mg by mouth daily.     . cholecalciferol (VITAMIN D) 1000 UNITS tablet Take 1,000 Units by mouth daily.    Marland Kitchen EFFIENT 10 MG TABS tablet TAKE 1 TABLET BY MOUTH DAILY. 90 tablet 3  . JARDIANCE 25 MG TABS tablet Take 1 tablet by mouth daily.  1  . LANTUS SOLOSTAR 100 UNIT/ML Solostar Pen Inject 25 Units into the skin  daily.  3  . lisinopril (PRINIVIL,ZESTRIL) 40 MG tablet Take 1 tablet (40 mg total) by mouth daily. 30 tablet 6  . metFORMIN (GLUCOPHAGE-XR) 500 MG 24 hr tablet Take 1,000 mg by mouth 2 (two) times daily.  3  . methocarbamol (ROBAXIN) 500 MG tablet Take 500 mg by mouth 3 (three) times daily. scheduled    . metoprolol succinate (TOPROL-XL) 25 MG 24 hr tablet Take 25 mg by mouth 2 (two) times daily.  3  . metroNIDAZOLE (METROGEL) 1 % gel Apply 1 application topically daily as needed (rosacea).    . nitroGLYCERIN (NITROSTAT) 0.4 MG SL tablet Place 1 tablet (0.4 mg total) under the tongue every 5 (five) minutes as needed for chest pain (CP or SOB). 25 tablet 3  . omega-3 acid ethyl esters (LOVAZA) 1 G capsule Take 1 g by mouth 2 (two) times daily.     . polyethylene glycol powder (MIRALAX) powder Take 17 g by mouth at bedtime as needed (constipation).     . prasugrel (EFFIENT) 10 MG TABS tablet Take 1 tablet (10 mg total) by mouth daily. 30 tablet 0  . pregabalin (LYRICA) 150 MG capsule Take 150 mg by mouth 2 (two) times daily.      Marland Kitchen pyridOXINE (VITAMIN B-6) 100 MG tablet Take 100 mg by mouth daily.     . traMADol (ULTRAM) 50 MG tablet Take 50 mg by mouth 3 (three) times daily. scheduled    . zolpidem (AMBIEN) 10 MG tablet Take 10 mg by mouth at bedtime.      No current facility-administered medications for this visit.     Allergies:   Brilinta [ticagrelor]; Codeine; Other; and Statins    Social History:  The patient  reports that he has never smoked. He has never used smokeless tobacco. He reports that he does not drink alcohol or use drugs.   Family History:  The patient's family history is not on file.    ROS:  Please see the history of present illness.   Otherwise, review of systems are positive for none.   All other systems are reviewed and negative.    PHYSICAL EXAM: VS:  BP (!) 156/72   Pulse 66   Ht 6\' 1"  (1.854 m)   Wt 290 lb 3.2 oz (131.6 kg)   BMI 38.29 kg/m  , BMI Body  mass index is 38.29 kg/m. GEN: Well nourished, well developed, in no acute distress  HEENT: normal  Neck: no JVD, carotid bruits, or masses Cardiac: RRR; no murmurs, rubs, or gallops,no edema  Respiratory:  clear to auscultation bilaterally, normal work of breathing GI: soft, nontender, nondistended, + BS MS: no deformity or  atrophy  Skin: warm and dry, no rash Neuro:  Strength and sensation are intact Psych: euthymic mood, full affect Vascular: Femoral pulses are normal bilaterally.  Distal pulses are not palpable.  There is a callus on the left big toe with some collection of blood.   EKG:  EKG is not ordered today.    Recent Labs: No results found for requested labs within last 8760 hours.    Lipid Panel    Component Value Date/Time   CHOL 181 07/23/2013 0155   TRIG 297 (H) 07/23/2013 0155   HDL 26 (L) 07/23/2013 0155   CHOLHDL 7.0 07/23/2013 0155   VLDL 59 (H) 07/23/2013 0155   LDLCALC 96 07/23/2013 0155      Wt Readings from Last 3 Encounters:  07/22/17 290 lb 3.2 oz (131.6 kg)  08/12/15 297 lb 4.8 oz (134.9 kg)  08/18/13 (!) 317 lb (143.8 kg)       No flowsheet data found.    ASSESSMENT AND PLAN:  1.  Peripheral arterial disease: The patient seems to have significant below the knee disease bilaterally much worse on the left side.  However, at the present time he has no claudication.  He does have recurrent nonhealing callus on the left big toe and there are plans for surgery for ingrown toenails. I explained to him that any surgery on his feet might not heal completely due to poor circulation.  I discussed with him the option of proceeding with angiography and possible endovascular intervention .  He wanted to discuss the situation with his wife who was not present.  After discussion he called [place] back and is agreeable to proceed with angiography and possible endovascular intervention.  2.  Coronary artery disease involving native coronary arteries without  angina: He continues to be on Effient.  I suggested switching to Plavix but he wants to discuss with his wife who is a Marine scientist.  3.  Bilateral carotid disease: In the 60 to 79% range bilaterally with no evaluation since 2016.  I requested a follow-up carotid Doppler.  4.  Essential hypertension: Blood pressure is mildly elevated.  We should consider switching metoprolol to carvedilol.  5.  Hyperlipidemia: No lipid profile since 2015 in the system.    Disposition:   FU with me in 1 months  Signed,  Kathlyn Sacramento, MD  07/22/2017 9:43 AM    [place]

## 2017-07-22 NOTE — H&P (View-Only) (Signed)
Cardiology Office Note   Date:  07/22/2017   ID:  THERON CUMBIE, DOB 1955/05/25, MRN [id number]  PCP:  Burnard Bunting, MD  Cardiologist:   Kathlyn Sacramento, MD   No chief complaint on file.     History of Present Illness: Spencer [name] is a 62 y.o. male who was referred by Dr. Jacqualyn Posey for evaluation and management of peripheral arterial disease.  He is to be followed in our office by Dr. Mare Ferrari and most recently seen in 2015.  He has known history of coronary artery disease status post CABG in 2012, mild ischemic cardiomyopathy with an EF of 45%, diabetes mellitus, bilateral carotid disease, obesity hyperlipidemia with intolerance to statins and recently diagnosed peripheral arterial disease.  He is status post PCI and drug-eluting stent placement to both SVG to OM and SVG to RCA in 2015. He is being followed by Dr. Jacqualyn Posey for pre-ulcerative callus on the left big toe and painful elongated toenails.  He underwent noninvasive vascular studies.  ABI were not reliable due to calcifications.  Duplex showed moderate bilateral SFA disease.  There was two-vessel runoff on the right side.  On the left side, no flow was detected in the anterior tibial, posterior tibial or peroneal arteries.  He has been doing reasonably well from a cardiac standpoint with no recent chest pain or shortness of breath.  He denies leg claudication.  He has known history of recurrent ingrown toenails.  He also has a callus on the left big toe.   Past Medical History:  Diagnosis Date  . Anxiety attack   . Arthritis    "back; shoulders; knees" (07/23/2013)  . Back pain, chronic    "all over" (07/23/2013)  . Benign neoplasm of colon 04/01/2000   Hyperplastic  . Coronary artery disease    a. NSTEMI 10/12: s/p CABG with L-LAD, S-OM1/OM2, S-RV marg/RCA;  b. 07/2013 Cath/PCI: LM <10, LAD 70-80p, 70m, D1 90, RI 30ost, LCX 40-50p, 8m, OM1 90, OM2 100, RCA 100p, VG->OM1->OM2 patent with 90 @ anast (2.5x12 Xience Alpine  DES), RCA 100p, LIMA->LAD nl, VG->Diag nl, VG->RV marginal/RCA 90p (3.0x23 Xience Alpine DES);  c. 07/2013 Echo: EF 55-60%.  . Depression    "pain related"  . DM2 (diabetes mellitus, type 2) ([place])   . GERD (gastroesophageal reflux disease)   . Hyperlipidemia   . Hypertension   . IBS (irritable bowel syndrome)   . Knee pain, bilateral    a. since 01/2013.  . Morbidly obese ([place])   . Myocardial infarction ([place]) 2012  . Obesity   . Pleural effusion, left 11/2010   Postop; resolved with p.o. diuresis    Past Surgical History:  Procedure Laterality Date  . BACK SURGERY    . CARDIAC CATHETERIZATION  11/2010  . CARPAL TUNNEL RELEASE Bilateral   . CORONARY ANGIOPLASTY WITH STENT PLACEMENT  07/23/2013   "2"  . CORONARY ARTERY BYPASS GRAFT  11/26/10   x 6 SURGEON DR. PETER VAN TRIGT  . HERNIA REPAIR     UHR  . LEFT HEART CATHETERIZATION WITH CORONARY/GRAFT ANGIOGRAM N/A 07/23/2013   Procedure: LEFT HEART CATHETERIZATION WITH Beatrix Fetters;  Surgeon: Peter M Martinique, MD;  Location[facility] CATH LAB;  Service: Cardiovascular;  Laterality: N/A;  . LUMBAR FUSION     MULTIPLE; DR. Ellene Route  . SHOULDER ARTHROSCOPY W/ ROTATOR CUFF REPAIR Bilateral   . UMBILICAL HERNIA REPAIR       Current Outpatient Medications  Medication Sig Dispense Refill  . Docusate  Calcium (STOOL SOFTENER PO) Take 1 capsule by mouth daily.    . Probiotic Product (PROBIOTIC DAILY) CAPS Take 1 capsule by mouth daily.    . sertraline (ZOLOFT) 50 MG tablet Take 50 mg by mouth 2 (two) times daily.      . Ascorbic Acid (VITAMIN C) 1000 MG tablet Take 1,000 mg by mouth daily.    Marland Kitchen aspirin EC 81 MG tablet Take 81 mg by mouth daily.     . cholecalciferol (VITAMIN D) 1000 UNITS tablet Take 1,000 Units by mouth daily.    Marland Kitchen EFFIENT 10 MG TABS tablet TAKE 1 TABLET BY MOUTH DAILY. 90 tablet 3  . JARDIANCE 25 MG TABS tablet Take 1 tablet by mouth daily.  1  . LANTUS SOLOSTAR 100 UNIT/ML Solostar Pen Inject 25 Units into the skin  daily.  3  . lisinopril (PRINIVIL,ZESTRIL) 40 MG tablet Take 1 tablet (40 mg total) by mouth daily. 30 tablet 6  . metFORMIN (GLUCOPHAGE-XR) 500 MG 24 hr tablet Take 1,000 mg by mouth 2 (two) times daily.  3  . methocarbamol (ROBAXIN) 500 MG tablet Take 500 mg by mouth 3 (three) times daily. scheduled    . metoprolol succinate (TOPROL-XL) 25 MG 24 hr tablet Take 25 mg by mouth 2 (two) times daily.  3  . metroNIDAZOLE (METROGEL) 1 % gel Apply 1 application topically daily as needed (rosacea).    . nitroGLYCERIN (NITROSTAT) 0.4 MG SL tablet Place 1 tablet (0.4 mg total) under the tongue every 5 (five) minutes as needed for chest pain (CP or SOB). 25 tablet 3  . omega-3 acid ethyl esters (LOVAZA) 1 G capsule Take 1 g by mouth 2 (two) times daily.     . polyethylene glycol powder (MIRALAX) powder Take 17 g by mouth at bedtime as needed (constipation).     . prasugrel (EFFIENT) 10 MG TABS tablet Take 1 tablet (10 mg total) by mouth daily. 30 tablet 0  . pregabalin (LYRICA) 150 MG capsule Take 150 mg by mouth 2 (two) times daily.      Marland Kitchen pyridOXINE (VITAMIN B-6) 100 MG tablet Take 100 mg by mouth daily.     . traMADol (ULTRAM) 50 MG tablet Take 50 mg by mouth 3 (three) times daily. scheduled    . zolpidem (AMBIEN) 10 MG tablet Take 10 mg by mouth at bedtime.      No current facility-administered medications for this visit.     Allergies:   Brilinta [ticagrelor]; Codeine; Other; and Statins    Social History:  The patient  reports that he has never smoked. He has never used smokeless tobacco. He reports that he does not drink alcohol or use drugs.   Family History:  The patient's family history is not on file.    ROS:  Please see the history of present illness.   Otherwise, review of systems are positive for none.   All other systems are reviewed and negative.    PHYSICAL EXAM: VS:  BP (!) 156/72   Pulse 66   Ht 6\' 1"  (1.854 m)   Wt 290 lb 3.2 oz (131.6 kg)   BMI 38.29 kg/m  , BMI Body  mass index is 38.29 kg/m. GEN: Well nourished, well developed, in no acute distress  HEENT: normal  Neck: no JVD, carotid bruits, or masses Cardiac: RRR; no murmurs, rubs, or gallops,no edema  Respiratory:  clear to auscultation bilaterally, normal work of breathing GI: soft, nontender, nondistended, + BS MS: no deformity or  atrophy  Skin: warm and dry, no rash Neuro:  Strength and sensation are intact Psych: euthymic mood, full affect Vascular: Femoral pulses are normal bilaterally.  Distal pulses are not palpable.  There is a callus on the left big toe with some collection of blood.   EKG:  EKG is not ordered today.    Recent Labs: No results found for requested labs within last 8760 hours.    Lipid Panel    Component Value Date/Time   CHOL 181 07/23/2013 0155   TRIG 297 (H) 07/23/2013 0155   HDL 26 (L) 07/23/2013 0155   CHOLHDL 7.0 07/23/2013 0155   VLDL 59 (H) 07/23/2013 0155   LDLCALC 96 07/23/2013 0155      Wt Readings from Last 3 Encounters:  07/22/17 290 lb 3.2 oz (131.6 kg)  08/12/15 297 lb 4.8 oz (134.9 kg)  08/18/13 (!) 317 lb (143.8 kg)       No flowsheet data found.    ASSESSMENT AND PLAN:  1.  Peripheral arterial disease: The patient seems to have significant below the knee disease bilaterally much worse on the left side.  However, at the present time he has no claudication.  He does have recurrent nonhealing callus on the left big toe and there are plans for surgery for ingrown toenails. I explained to him that any surgery on his feet might not heal completely due to poor circulation.  I discussed with him the option of proceeding with angiography and possible endovascular intervention .  He wanted to discuss the situation with his wife who was not present.  After discussion he called [place] back and is agreeable to proceed with angiography and possible endovascular intervention.  2.  Coronary artery disease involving native coronary arteries without  angina: He continues to be on Effient.  I suggested switching to Plavix but he wants to discuss with his wife who is a Marine scientist.  3.  Bilateral carotid disease: In the 60 to 79% range bilaterally with no evaluation since 2016.  I requested a follow-up carotid Doppler.  4.  Essential hypertension: Blood pressure is mildly elevated.  We should consider switching metoprolol to carvedilol.  5.  Hyperlipidemia: No lipid profile since 2015 in the system.    Disposition:   FU with me in 1 months  Signed,  Kathlyn Sacramento, MD  07/22/2017 9:43 AM    [place]

## 2017-07-23 ENCOUNTER — Telehealth: Payer: Self-pay | Admitting: Cardiovascular Disease

## 2017-07-23 ENCOUNTER — Encounter: Payer: Self-pay | Admitting: *Deleted

## 2017-07-23 DIAGNOSIS — Z01818 Encounter for other preprocedural examination: Secondary | ICD-10-CM

## 2017-07-23 DIAGNOSIS — I739 Peripheral vascular disease, unspecified: Secondary | ICD-10-CM

## 2017-07-23 NOTE — Telephone Encounter (Signed)
Patient has been scheduled for the procedure on 07/30/17 with Dr. Fletcher Anon at 8:30. The patient will come in tomorrow at 10:30 for pre-procedure labs and instructions for the procedure.

## 2017-07-23 NOTE — Telephone Encounter (Signed)
New message    Patient would like to arrange procedure asap, he does not want to wait to have it done

## 2017-07-23 NOTE — Telephone Encounter (Signed)
Okay go ahead and schedule him for abdominal aortogram, lower extremity runoff and possible endovascular intervention on Wednesday.

## 2017-07-23 NOTE — Telephone Encounter (Signed)
Returned the call to the patient. He stated that after his office visit yesterday that he spoke with his wife. They feel like it would be best to proceed with an angiography. He would like to have this done as soon as possible. The patient has a carotid scheduled for 07/29/17.

## 2017-07-24 DIAGNOSIS — Z01818 Encounter for other preprocedural examination: Secondary | ICD-10-CM | POA: Diagnosis not present

## 2017-07-24 DIAGNOSIS — I739 Peripheral vascular disease, unspecified: Secondary | ICD-10-CM | POA: Diagnosis not present

## 2017-07-24 LAB — CBC
HEMATOCRIT: 41.3 % (ref 37.5–51.0)
Hemoglobin: 14.2 g/dL (ref 13.0–17.7)
MCH: 29.3 pg (ref 26.6–33.0)
MCHC: 34.4 g/dL (ref 31.5–35.7)
MCV: 85 fL (ref 79–97)
PLATELETS: 270 10*3/uL (ref 150–450)
RBC: 4.84 x10E6/uL (ref 4.14–5.80)
RDW: 14.5 % (ref 12.3–15.4)
WBC: 7.1 10*3/uL (ref 3.4–10.8)

## 2017-07-24 LAB — BASIC METABOLIC PANEL
BUN/Creatinine Ratio: 21 (ref 10–24)
BUN: 19 mg/dL (ref 8–27)
CO2: 19 mmol/L — AB (ref 20–29)
CREATININE: 0.92 mg/dL (ref 0.76–1.27)
Calcium: 9.3 mg/dL (ref 8.6–10.2)
Chloride: 101 mmol/L (ref 96–106)
GFR calc Af Amer: 104 mL/min/{1.73_m2} (ref >59)
GFR, EST NON AFRICAN AMERICAN: 90 mL/min/{1.73_m2} (ref >59)
Glucose: 125 mg/dL — ABNORMAL HIGH (ref 65–99)
Potassium: 4.4 mmol/L (ref 3.5–5.2)
SODIUM: 137 mmol/L (ref 134–144)

## 2017-07-29 ENCOUNTER — Ambulatory Visit (HOSPITAL_COMMUNITY)
Admission: RE | Admit: 2017-07-29 | Discharge: 2017-07-29 | Disposition: A | Discharge status: Home / Self Care | Payer: 59 | Source: Ambulatory Visit | Attending: Cardiovascular Disease | Admitting: Cardiovascular Disease

## 2017-07-29 DIAGNOSIS — I6523 Occlusion and stenosis of bilateral carotid arteries: Secondary | ICD-10-CM | POA: Insufficient documentation

## 2017-07-30 ENCOUNTER — Telehealth: Payer: Self-pay | Admitting: *Deleted

## 2017-07-30 ENCOUNTER — Ambulatory Visit (HOSPITAL_COMMUNITY)
Admission: RE | Admit: 2017-07-30 | Discharge: 2017-07-30 | Disposition: A | Discharge status: Home / Self Care | Payer: 59 | Source: Ambulatory Visit | Attending: Cardiovascular Disease | Admitting: Cardiovascular Disease

## 2017-07-30 ENCOUNTER — Encounter (HOSPITAL_COMMUNITY): Payer: Self-pay | Admitting: Cardiovascular Disease

## 2017-07-30 ENCOUNTER — Ambulatory Visit (HOSPITAL_COMMUNITY)
Admission: RE | Disposition: A | Discharge status: Home / Self Care | Payer: Self-pay | Source: Ambulatory Visit | Attending: Cardiovascular Disease

## 2017-07-30 DIAGNOSIS — E1151 Type 2 diabetes mellitus with diabetic peripheral angiopathy without gangrene: Secondary | ICD-10-CM | POA: Insufficient documentation

## 2017-07-30 DIAGNOSIS — I779 Disorder of arteries and arterioles, unspecified: Secondary | ICD-10-CM

## 2017-07-30 DIAGNOSIS — F329 Major depressive disorder, single episode, unspecified: Secondary | ICD-10-CM | POA: Insufficient documentation

## 2017-07-30 DIAGNOSIS — Z888 Allergy status to other drugs, medicaments and biological substances status: Secondary | ICD-10-CM | POA: Insufficient documentation

## 2017-07-30 DIAGNOSIS — I252 Old myocardial infarction: Secondary | ICD-10-CM | POA: Insufficient documentation

## 2017-07-30 DIAGNOSIS — Z794 Long term (current) use of insulin: Secondary | ICD-10-CM | POA: Diagnosis not present

## 2017-07-30 DIAGNOSIS — Z885 Allergy status to narcotic agent status: Secondary | ICD-10-CM | POA: Insufficient documentation

## 2017-07-30 DIAGNOSIS — Z955 Presence of coronary angioplasty implant and graft: Secondary | ICD-10-CM | POA: Diagnosis not present

## 2017-07-30 DIAGNOSIS — M1990 Unspecified osteoarthritis, unspecified site: Secondary | ICD-10-CM | POA: Insufficient documentation

## 2017-07-30 DIAGNOSIS — I70203 Unspecified atherosclerosis of native arteries of extremities, bilateral legs: Secondary | ICD-10-CM | POA: Diagnosis not present

## 2017-07-30 DIAGNOSIS — I2510 Atherosclerotic heart disease of native coronary artery without angina pectoris: Secondary | ICD-10-CM | POA: Diagnosis not present

## 2017-07-30 DIAGNOSIS — I10 Essential (primary) hypertension: Secondary | ICD-10-CM | POA: Diagnosis not present

## 2017-07-30 DIAGNOSIS — Z7982 Long term (current) use of aspirin: Secondary | ICD-10-CM | POA: Diagnosis not present

## 2017-07-30 DIAGNOSIS — I255 Ischemic cardiomyopathy: Secondary | ICD-10-CM | POA: Insufficient documentation

## 2017-07-30 DIAGNOSIS — K589 Irritable bowel syndrome without diarrhea: Secondary | ICD-10-CM | POA: Diagnosis not present

## 2017-07-30 DIAGNOSIS — Z79899 Other long term (current) drug therapy: Secondary | ICD-10-CM | POA: Diagnosis not present

## 2017-07-30 DIAGNOSIS — E785 Hyperlipidemia, unspecified: Secondary | ICD-10-CM | POA: Insufficient documentation

## 2017-07-30 DIAGNOSIS — Z9889 Other specified postprocedural states: Secondary | ICD-10-CM | POA: Diagnosis not present

## 2017-07-30 DIAGNOSIS — Z951 Presence of aortocoronary bypass graft: Secondary | ICD-10-CM | POA: Diagnosis not present

## 2017-07-30 DIAGNOSIS — I70212 Atherosclerosis of native arteries of extremities with intermittent claudication, left leg: Secondary | ICD-10-CM | POA: Diagnosis not present

## 2017-07-30 DIAGNOSIS — K219 Gastro-esophageal reflux disease without esophagitis: Secondary | ICD-10-CM | POA: Diagnosis not present

## 2017-07-30 DIAGNOSIS — Z6838 Body mass index (BMI) 38.0-38.9, adult: Secondary | ICD-10-CM | POA: Diagnosis not present

## 2017-07-30 DIAGNOSIS — Z86018 Personal history of other benign neoplasm: Secondary | ICD-10-CM | POA: Diagnosis not present

## 2017-07-30 DIAGNOSIS — I739 Peripheral vascular disease, unspecified: Secondary | ICD-10-CM

## 2017-07-30 HISTORY — PX: ABDOMINAL AORTOGRAM W/LOWER EXTREMITY: CATH118223

## 2017-07-30 LAB — GLUCOSE, CAPILLARY
Glucose-Capillary: 161 mg/dL — ABNORMAL HIGH (ref 65–99)
Glucose-Capillary: 150 mg/dL — ABNORMAL HIGH (ref 65–99)

## 2017-07-30 SURGERY — ABDOMINAL AORTOGRAM W/LOWER EXTREMITY
Anesthesia: LOCAL

## 2017-07-30 MED ORDER — SODIUM CHLORIDE 0.9% FLUSH: 3.0000 mL | INTRAVENOUS | Status: DC | PRN

## 2017-07-30 MED ORDER — HYDRALAZINE HCL 20 MG/ML IJ SOLN: 5.0000 mg | INTRAMUSCULAR | Status: DC | PRN

## 2017-07-30 MED ORDER — FENTANYL CITRATE (PF) 100 MCG/2ML IJ SOLN
INTRAMUSCULAR | Status: DC | PRN
  Administered 2017-07-30: 50 ug via INTRAVENOUS

## 2017-07-30 MED ORDER — FENTANYL CITRATE (PF) 100 MCG/2ML IJ SOLN
INTRAMUSCULAR | Status: AC
  Filled 2017-07-30: qty 2

## 2017-07-30 MED ORDER — HEPARIN (PORCINE) IN NACL 1000-0.9 UT/500ML-% IV SOLN
INTRAVENOUS | Status: AC
  Filled 2017-07-30: qty 500

## 2017-07-30 MED ORDER — SODIUM CHLORIDE 0.9% FLUSH: 3.0000 mL | Freq: Two times a day (BID) | INTRAVENOUS | Status: DC

## 2017-07-30 MED ORDER — LIDOCAINE HCL (PF) 1 % IJ SOLN
INTRAMUSCULAR | Status: AC
  Filled 2017-07-30: qty 30

## 2017-07-30 MED ORDER — ONDANSETRON HCL 4 MG/2ML IJ SOLN: 4.0000 mg | Freq: Four times a day (QID) | INTRAMUSCULAR | Status: DC | PRN

## 2017-07-30 MED ORDER — IODIXANOL 320 MG/ML IV SOLN
INTRAVENOUS | Status: DC | PRN
  Administered 2017-07-30: 118 mL via INTRA_ARTERIAL

## 2017-07-30 MED ORDER — ACETAMINOPHEN 325 MG PO TABS: 650.0000 mg | ORAL_TABLET | ORAL | Status: DC | PRN

## 2017-07-30 MED ORDER — HEPARIN (PORCINE) IN NACL 2-0.9 UNITS/ML
INTRAMUSCULAR | Status: AC | PRN
  Administered 2017-07-30 (×2): 500 mL

## 2017-07-30 MED ORDER — MIDAZOLAM HCL 2 MG/2ML IJ SOLN
INTRAMUSCULAR | Status: AC
  Filled 2017-07-30: qty 2

## 2017-07-30 MED ORDER — LIDOCAINE HCL (PF) 1 % IJ SOLN
INTRAMUSCULAR | Status: DC | PRN
  Administered 2017-07-30: 15 mL via INTRADERMAL

## 2017-07-30 MED ORDER — SODIUM CHLORIDE 0.9 % IV SOLN: INTRAVENOUS | Status: AC

## 2017-07-30 MED ORDER — LABETALOL HCL 5 MG/ML IV SOLN: 10.0000 mg | INTRAVENOUS | Status: DC | PRN

## 2017-07-30 MED ORDER — SODIUM CHLORIDE 0.9 % IV SOLN: 250.0000 mL | INTRAVENOUS | Status: DC | PRN

## 2017-07-30 MED ORDER — MIDAZOLAM HCL 2 MG/2ML IJ SOLN
INTRAMUSCULAR | Status: DC | PRN
  Administered 2017-07-30: 1 mg via INTRAVENOUS

## 2017-07-30 MED ORDER — SODIUM CHLORIDE 0.9 % IV SOLN
INTRAVENOUS | Status: DC
  Administered 2017-07-30: 08:00:00 via INTRAVENOUS

## 2017-07-30 MED ORDER — ASPIRIN 81 MG PO CHEW: 81.0000 mg | CHEWABLE_TABLET | ORAL | Status: DC

## 2017-07-30 SURGICAL SUPPLY — 18 items
CATH ANGIO 5F PIGTAIL 65CM (CATHETERS) ×1 IMPLANT
CATH QUICKCROSS SUPP .035X90CM (MICROCATHETER) ×1 IMPLANT
CATH SOFT-VU 4F 65 STRAIGHT (CATHETERS) IMPLANT
CATH SOFT-VU STRAIGHT 4F 65CM (CATHETERS) ×2
CATH STRAIGHT 5FR 65CM (CATHETERS) ×1 IMPLANT
CATH TEMPO 5F RIM 65CM (CATHETERS) ×1 IMPLANT
COVER PRB 48X5XTLSCP FOLD TPE (BAG) IMPLANT
COVER PROBE 5X48 (BAG) ×2
DEVICE TORQUE .025-.038 (MISCELLANEOUS) ×1 IMPLANT
GUIDEWIRE ANGLED .035X150CM (WIRE) ×1 IMPLANT
KIT MICROPUNCTURE NIT STIFF (SHEATH) ×1 IMPLANT
KIT PV (KITS) ×2 IMPLANT
SHEATH AVANTI 11CM 5FR (SHEATH) ×1 IMPLANT
SYRINGE MEDRAD AVANTA MACH 7 (SYRINGE) ×1 IMPLANT
TRANSDUCER W/STOPCOCK (MISCELLANEOUS) ×2 IMPLANT
TRAY PV CATH (CUSTOM PROCEDURE TRAY) ×2 IMPLANT
WIRE BENTSON .035X145CM (WIRE) ×1 IMPLANT
WIRE ROSEN-J .035X180CM (WIRE) ×1 IMPLANT

## 2017-07-30 NOTE — Telephone Encounter (Signed)
-----   Message from Wellington Hampshire, MD sent at 07/30/2017  5:00 PM EDT ----- Stable carotid disease. Repeat in 1 year.

## 2017-07-30 NOTE — Telephone Encounter (Signed)
Patient made aware of results and verbalized their understanding. Repeat carotids ordered for a year from now.

## 2017-07-30 NOTE — Discharge Instructions (Signed)

## 2017-07-30 NOTE — Interval H&P Note (Signed)
History and Physical Interval Note:  07/30/2017 8:42 AM  Spencer [name]  has presented today for surgery, with the diagnosis of pad  The various methods of treatment have been discussed with the patient and family. After consideration of risks, benefits and other options for treatment, the patient has consented to  Procedure(s): ABDOMINAL AORTOGRAM W/LOWER EXTREMITY (N/A) as a surgical intervention .  The patient's history has been reviewed, patient examined, no change in status, stable for surgery.  I have reviewed the patient's chart and labs.  Questions were answered to the patient's satisfaction.     Kathlyn Sacramento

## 2017-07-30 NOTE — Progress Notes (Signed)
Site area: rt groin fa sheath Site Prior to Removal:  Level 0 Pressure Applied For: 20 minutes Manual:   yes Patient Status During Pull:  stable Post Pull Site:  Level 0 Post Pull Instructions Given:  yes Post Pull Pulses Present: rt dp dopplered Dressing Applied:  Gauze and tegaderm Bedrest begins @ 9381 Comments:

## 2017-07-31 MED FILL — Heparin Sod (Porcine)-NaCl IV Soln 1000 Unit/500ML-0.9%: INTRAVENOUS | Qty: 1000 | Status: AC

## 2017-08-19 ENCOUNTER — Ambulatory Visit (INDEPENDENT_AMBULATORY_CARE_PROVIDER_SITE_OTHER): Payer: 59 | Admitting: Cardiovascular Disease

## 2017-08-19 ENCOUNTER — Encounter: Payer: Self-pay | Admitting: Cardiovascular Disease

## 2017-08-19 VITALS — BP 174/64 | HR 78 | Ht 73.0 in | Wt 292.0 lb

## 2017-08-19 DIAGNOSIS — E785 Hyperlipidemia, unspecified: Secondary | ICD-10-CM | POA: Diagnosis not present

## 2017-08-19 DIAGNOSIS — I10 Essential (primary) hypertension: Secondary | ICD-10-CM | POA: Diagnosis not present

## 2017-08-19 DIAGNOSIS — I6523 Occlusion and stenosis of bilateral carotid arteries: Secondary | ICD-10-CM

## 2017-08-19 DIAGNOSIS — I2510 Atherosclerotic heart disease of native coronary artery without angina pectoris: Secondary | ICD-10-CM | POA: Diagnosis not present

## 2017-08-19 DIAGNOSIS — I739 Peripheral vascular disease, unspecified: Secondary | ICD-10-CM

## 2017-08-19 DIAGNOSIS — I779 Disorder of arteries and arterioles, unspecified: Secondary | ICD-10-CM | POA: Diagnosis not present

## 2017-08-19 LAB — LIPID PANEL
Chol/HDL Ratio: 7.1 ratio — ABNORMAL HIGH (ref 0.0–5.0)
Cholesterol, Total: 207 mg/dL — ABNORMAL HIGH (ref 100–199)
HDL: 29 mg/dL — AB (ref >39)
Triglycerides: 406 mg/dL — ABNORMAL HIGH (ref 0–149)

## 2017-08-19 LAB — HEPATIC FUNCTION PANEL
ALT: 22 IU/L (ref 0–44)
AST: 15 IU/L (ref 0–40)
Albumin: 4.4 g/dL (ref 3.6–4.8)
Alkaline Phosphatase: 74 IU/L (ref 39–117)
Bilirubin Total: 0.4 mg/dL (ref 0.0–1.2)
Bilirubin, Direct: 0.13 mg/dL (ref 0.00–0.40)
Total Protein: 7.2 g/dL (ref 6.0–8.5)

## 2017-08-19 MED ORDER — CARVEDILOL 6.25 MG PO TABS: 6.2500 mg | ORAL_TABLET | Freq: Two times a day (BID) | ORAL | 3 refills | Status: DC

## 2017-08-19 MED ORDER — CLOPIDOGREL BISULFATE 75 MG PO TABS: 75.0000 mg | ORAL_TABLET | Freq: Every day | ORAL | 3 refills | Status: DC

## 2017-08-19 MED FILL — CARVEDILOL 6.25 MG TAB: 6.25 | 90 days supply | Qty: 180 | Fill #0

## 2017-08-19 MED FILL — CLOPIDOGREL 75 MG TABLET: 75 | 90 days supply | Qty: 90 | Fill #0

## 2017-08-19 NOTE — Progress Notes (Signed)
Cardiology Office Note   Date:  08/19/2017   ID:  Spencer [name], DOB [birth year], MRN [id number]  PCP:  Burnard Bunting, MD  Cardiologist:   Kathlyn Sacramento, MD   No chief complaint on file.     History of Present Illness: Spencer [name] is a 62 y.o. male who is here today for follow-up visit regarding peripheral arterial disease.  He used to be followed in our office by Dr. Mare Ferrari and most recently seen in 2015.  He has known history of coronary artery disease status post CABG in 2012, mild ischemic cardiomyopathy with an EF of 45%, diabetes mellitus, bilateral carotid disease, obesity hyperlipidemia with intolerance to statins and recently diagnosed peripheral arterial disease.  He is status post PCI and drug-eluting stent placement to both SVG to OM and SVG to RCA in 2015. He is being followed by Dr. Jacqualyn Posey for pre-ulcerative callus on the left big toe and painful elongated toenails.  He underwent noninvasive vascular studies.  ABI were not reliable due to calcifications.  Duplex showed moderate bilateral SFA disease.  There was two-vessel runoff on the right side.  On the left side, no flow was detected in the anterior tibial, posterior tibial or peroneal arteries.  I proceeded with angiography last month which showed no significant aortoiliac disease.  There was mild to moderate SFA and popliteal disease.  One-vessel runoff below the knee bilaterally via the peroneal artery which gives collaterals distally to the dorsalis pedis and plantar branches of the posterior tibial artery.  No revascularization was needed.    He has been doing well with no recent chest pain, shortness of breath or palpitations.    No leg claudication and no ulceration.    Past Medical History:  Diagnosis Date  . Anxiety attack   . Arthritis    "back; shoulders; knees" (07/23/2013)  . Back pain, chronic    "all over" (07/23/2013)  . Benign neoplasm of colon 04/01/2000   Hyperplastic  . Coronary artery  disease    a. NSTEMI 10/12: s/p CABG with L-LAD, S-OM1/OM2, S-RV marg/RCA;  b. 07/2013 Cath/PCI: LM <10, LAD 70-80p, 60m, D1 90, RI 30ost, LCX 40-50p, 3m, OM1 90, OM2 100, RCA 100p, VG->OM1->OM2 patent with 90 @ anast (2.5x12 Xience Alpine DES), RCA 100p, LIMA->LAD nl, VG->Diag nl, VG->RV marginal/RCA 90p (3.0x23 Xience Alpine DES);  c. 07/2013 Echo: EF 55-60%.  . Depression    "pain related"  . DM2 (diabetes mellitus, type 2) ([place])   . GERD (gastroesophageal reflux disease)   . Hyperlipidemia   . Hypertension   . IBS (irritable bowel syndrome)   . Knee pain, bilateral    a. since 01/2013.  . Morbidly obese ([place])   . Myocardial infarction ([place]) 2012  . Obesity   . Pleural effusion, left 11/2010   Postop; resolved with p.o. diuresis    Past Surgical History:  Procedure Laterality Date  . ABDOMINAL AORTOGRAM W/LOWER EXTREMITY N/A 07/30/2017   Procedure: ABDOMINAL AORTOGRAM W/LOWER EXTREMITY;  Surgeon: Wellington Hampshire, MD;  Location: [facility];  Service: Cardiovascular;  Laterality: N/A;  . BACK SURGERY    . CARDIAC CATHETERIZATION  11/2010  . CARPAL TUNNEL RELEASE Bilateral   . CORONARY ANGIOPLASTY WITH STENT PLACEMENT  07/23/2013   "2"  . CORONARY ARTERY BYPASS GRAFT  11/26/10   x 6 SURGEON DR. PETER VAN TRIGT  . HERNIA REPAIR     UHR  . LEFT HEART CATHETERIZATION WITH CORONARY/GRAFT ANGIOGRAM N/A 07/23/2013  Procedure: LEFT HEART CATHETERIZATION WITH Beatrix Fetters;  Surgeon: Peter M Martinique, MD;  Location: [facility] CATH LAB;  Service: Cardiovascular;  Laterality: N/A;  . LUMBAR FUSION     MULTIPLE; DR. Ellene Route  . SHOULDER ARTHROSCOPY W/ ROTATOR CUFF REPAIR Bilateral   . UMBILICAL HERNIA REPAIR       Current Outpatient Medications  Medication Sig Dispense Refill  . Ascorbic Acid (VITAMIN C) 1000 MG tablet Take 1,000 mg by mouth daily.    Marland Kitchen aspirin EC 81 MG tablet Take 81 mg by mouth daily.     . cholecalciferol (VITAMIN D) 1000 UNITS tablet Take 1,000 Units by mouth  daily.    Marland Kitchen docusate sodium (COLACE) 100 MG capsule Take 100 mg by mouth 2 (two) times daily.    Marland Kitchen EFFIENT 10 MG TABS tablet TAKE 1 TABLET BY MOUTH DAILY. 90 tablet 3  . JARDIANCE 25 MG TABS tablet Take 25 mg by mouth daily.   1  . LANTUS SOLOSTAR 100 UNIT/ML Solostar Pen Inject 25 Units into the skin daily.  3  . lisinopril (PRINIVIL,ZESTRIL) 40 MG tablet Take 1 tablet (40 mg total) by mouth daily. 30 tablet 6  . metFORMIN (GLUCOPHAGE-XR) 500 MG 24 hr tablet Take 1,000 mg by mouth 2 (two) times daily. 0530 & 1730  3  . methocarbamol (ROBAXIN) 500 MG tablet Take 500 mg by mouth every 8 (eight) hours. scheduled    . metoprolol succinate (TOPROL-XL) 25 MG 24 hr tablet Take 25 mg by mouth 2 (two) times daily. 0530 & 1730  3  . metroNIDAZOLE (METROGEL) 1 % gel Apply 1 application topically daily as needed (rosacea).    . nitroGLYCERIN (NITROSTAT) 0.4 MG SL tablet Place 1 tablet (0.4 mg total) under the tongue every 5 (five) minutes as needed for chest pain (CP or SOB). 25 tablet 3  . omega-3 acid ethyl esters (LOVAZA) 1 G capsule Take 2 g by mouth 2 (two) times daily. 0530 & 1730    . polyethylene glycol powder (MIRALAX) powder Take 17 g by mouth at bedtime as needed (constipation).     . pregabalin (LYRICA) 150 MG capsule Take 150 mg by mouth 2 (two) times daily. 0530 & 1730    . Probiotic Product (PROBIOTIC DAILY) CAPS Take 1 capsule by mouth every evening.     . pyridOXINE (VITAMIN B-6) 100 MG tablet Take 100 mg by mouth daily.     . ranitidine (ZANTAC) 300 MG tablet Take 300 mg by mouth daily.    . sertraline (ZOLOFT) 50 MG tablet Take 50 mg by mouth 2 (two) times daily. 0530 & 1730    . traMADol (ULTRAM) 50 MG tablet Take 50 mg by mouth every 6 (six) hours. scheduled    . zolpidem (AMBIEN) 10 MG tablet Take 5 mg by mouth at bedtime.      No current facility-administered medications for this visit.     Allergies:   Brilinta [ticagrelor]; Codeine; Other; and Statins    Social History:  The  patient  reports that he has never smoked. He has never used smokeless tobacco. He reports that he does not drink alcohol or use drugs.   Family History:  The patient's family history is not on file.    ROS:  Please see the history of present illness.   Otherwise, review of systems are positive for none.   All other systems are reviewed and negative.    PHYSICAL EXAM: VS:  BP (!) 174/64   Pulse 78  Ht 6\' 1"  (1.854 m)   Wt 292 lb (132.5 kg)   SpO2 95%   BMI 38.52 kg/m  , BMI Body mass index is 38.52 kg/m. GEN: Well nourished, well developed, in no acute distress  HEENT: normal  Neck: no JVD, carotid bruits, or masses Cardiac: RRR; no murmurs, rubs, or gallops,no edema  Respiratory:  clear to auscultation bilaterally, normal work of breathing GI: soft, nontender, nondistended, + BS MS: no deformity or atrophy  Skin: warm and dry, no rash Neuro:  Strength and sensation are intact Psych: euthymic mood, full affect Vascular: Femoral pulses are normal bilaterally.  No groin hematoma.  EKG:  EKG is not ordered today.    Recent Labs: 07/24/2017: BUN 19; Creatinine, Ser 0.92; Hemoglobin 14.2; Platelets 270; Potassium 4.4; Sodium 137    Lipid Panel    Component Value Date/Time   CHOL 181 07/23/2013 0155   TRIG 297 (H) 07/23/2013 0155   HDL 26 (L) 07/23/2013 0155   CHOLHDL 7.0 07/23/2013 0155   VLDL 59 (H) 07/23/2013 0155   LDLCALC 96 07/23/2013 0155      Wt Readings from Last 3 Encounters:  08/19/17 292 lb (132.5 kg)  07/30/17 287 lb (130.2 kg)  07/22/17 290 lb 3.2 oz (131.6 kg)       No flowsheet data found.    ASSESSMENT AND PLAN:  1.  Peripheral arterial disease: The patient has one-vessel runoff below the knee bilaterally via the peroneal artery with no other obstructive disease. No revascularization is needed.  Continue aggressive medical therapy.  2.  Coronary artery disease involving native coronary arteries without angina: It has been many years since  his most recent stenting.  Thus, I am going to switch him from Effient to Plavix.  I think he benefits from being on long-term dual antiplatelet therapy considering his extensive cardiovascular disease.  3.  Bilateral carotid disease: Most recent carotid Doppler in June of this year showed 40 to 59% stenosis in the right side and 60 to 79% on the left.  Repeat study in 1 year.  4.  Essential hypertension: Blood pressure  continues to be elevated.  I elected to switch metoprolol to carvedilol 6.25 mg twice daily.  This can be uptitrated as needed and amlodipine can be added later.  5.  Hyperlipidemia: He is intolerant to all statins  Due to severe myalgia.  I am going to obtain lipid and liver profile.  Most likely, he will require treatment with a PC SK 9 inhibitor given his extensive atherosclerosis.    Disposition:   FU with me in 4 months  Signed,  Kathlyn Sacramento, MD  08/19/2017 8:16 AM    [place]

## 2017-08-19 NOTE — Patient Instructions (Addendum)
Medication Instructions:  Stop Effient when you run out of your current prescription Then Start Plavix 75 mg daily  Stop metoprolol Start carvedilol (Coreg) 6.25 mg (1 tablet) two times daily  Labwork: Today (Lipid, Hepatic)   Follow-Up: 4 months with Dr. Fletcher Anon  Any Other Special Instructions Will Be Listed Below (If Applicable).     If you need a refill on your cardiac medications before your next appointment, please call your pharmacy.

## 2017-08-20 MED FILL — LISINOPRIL 40 MG TABS: 40 | 90 days supply | Qty: 90 | Fill #0

## 2017-08-20 MED FILL — raNITIdine HCL 300 MG TABS: 300 | 90 days supply | Qty: 90 | Fill #0

## 2017-08-20 MED FILL — OMEGA-3 ETHYL ESTERS 1 GM C: 1 | 90 days supply | Qty: 360 | Fill #0

## 2017-08-20 MED FILL — METHOCARBAMOL 500 MG TABLET: 500 | 90 days supply | Qty: 270 | Fill #0

## 2017-08-20 MED FILL — METFORMIN HCL ER 500 MG TAB: 500 | 90 days supply | Qty: 360 | Fill #0

## 2017-08-20 MED FILL — SERTRALINE HCL 50 MG TABLET: 50 | 90 days supply | Qty: 180 | Fill #0

## 2017-08-25 ENCOUNTER — Telehealth: Payer: Self-pay | Admitting: Internal Medicine

## 2017-08-25 MED FILL — PENTIPS 31G X 5 MM MISC: 31G X 5 MM | 90 days supply | Qty: 100 | Fill #0

## 2017-08-25 MED FILL — traMADol HCL 50 MG TABS: 50 | 90 days supply | Qty: 720 | Fill #0

## 2017-08-25 MED FILL — FREESTYLE LITE TEST STRIP: 90 days supply | Qty: 200 | Fill #0

## 2017-08-25 MED FILL — ZOLPIDEM TARTRATE 10 MG TAB: 10 | 90 days supply | Qty: 90 | Fill #0

## 2017-08-25 NOTE — Telephone Encounter (Signed)
error 

## 2017-08-26 ENCOUNTER — Ambulatory Visit: Payer: 59

## 2017-08-27 MED FILL — LYRICA 150 MG CAPSULE: 150 | 90 days supply | Qty: 180 | Fill #0

## 2017-09-16 ENCOUNTER — Ambulatory Visit (INDEPENDENT_AMBULATORY_CARE_PROVIDER_SITE_OTHER): Payer: 59 | Admitting: Pharmacist

## 2017-09-16 DIAGNOSIS — I6523 Occlusion and stenosis of bilateral carotid arteries: Secondary | ICD-10-CM

## 2017-09-16 DIAGNOSIS — E782 Mixed hyperlipidemia: Secondary | ICD-10-CM | POA: Diagnosis not present

## 2017-09-16 MED ORDER — ICOSAPENT ETHYL 1 G PO CAPS: 2.0000 g | ORAL_CAPSULE | Freq: Two times a day (BID) | ORAL | 3 refills | Status: DC

## 2017-09-16 MED FILL — VASCEPA 1 GM CAPSULE: 1 | 90 days supply | Qty: 360 | Fill #0

## 2017-09-16 NOTE — Progress Notes (Signed)
Patient ID: DEMONT LINFORD                 DOB: [birth year]                    MRN: [id number]     HPI: Spencer [name] is a 62 y.o. male patient referred to lipid clinic by Dr Fletcher Anon. PMH is significant for PAD, CAD s/p CABG in 2012, ischemic cardiomyopathy with EF 45%, DM, bilateral carotid disease, hyperlipidemia , and statin intolerant. Patient presents today for potential initiation of PCSK9i therapy and medication management. He reports severe muscle pain and cramps will all statis and is very reluctant to try "ANY more statins". His wife is a Marine scientist at [employer] and helps him with medication and diet changes. Patient has frequent follow ups with diabetes management pharmacist as well.  Current Medications:  Lovaza 2g twice daily   Intolerances: (ALL statins caused severe muscle pain and cramps) Pravastatin 40mg  daily  Rosuvastatin 20mg  daily  Simvastatin 20mg  daily  LDL goal: <86ml/dL  Diet: Diet changes; blood glucose increased,   Exercise: [employer]  Every Monday, Wednesday and Friday  Family History:   Social History: Reports that he has never smoked. He has never used smokeless tobacco. He reports that he does not drink alcohol or use drugs.  Labs: 08/19/2017: CHO 207; TG 406; HDL 29; LDL-c unable to calculate (Lovaza 2grams daily) 07/23/2013: CHO 181; TG 297; HDL 26; LDL-c 96   Past Medical History:  Diagnosis Date  . Anxiety attack   . Arthritis    "back; shoulders; knees" (07/23/2013)  . Back pain, chronic    "all over" (07/23/2013)  . Benign neoplasm of colon 04/01/2000   Hyperplastic  . Coronary artery disease    a. NSTEMI 10/12: s/p CABG with L-LAD, S-OM1/OM2, S-RV marg/RCA;  b. 07/2013 Cath/PCI: LM <10, LAD 70-80p, 38m, D1 90, RI 30ost, LCX 40-50p, 29m, OM1 90, OM2 100, RCA 100p, VG->OM1->OM2 patent with 90 @ anast (2.5x12 Xience Alpine DES), RCA 100p, LIMA->LAD nl, VG->Diag nl, VG->RV marginal/RCA 90p (3.0x23 Xience Alpine DES);  c. 07/2013 Echo: EF 55-60%.  . Depression    "pain related"  . DM2 (diabetes mellitus, type 2) ([place])   . GERD (gastroesophageal reflux disease)   . Hyperlipidemia   . Hypertension   . IBS (irritable bowel syndrome)   . Knee pain, bilateral    a. since 01/2013.  . Morbidly obese ([place])   . Myocardial infarction ([place]) 2012  . Obesity   . Pleural effusion, left 11/2010   Postop; resolved with p.o. diuresis    Current Outpatient Medications on File Prior to Visit  Medication Sig Dispense Refill  . Ascorbic Acid (VITAMIN C) 1000 MG tablet Take 1,000 mg by mouth daily.    Marland Kitchen aspirin EC 81 MG tablet Take 81 mg by mouth daily.     . carvedilol (COREG) 6.25 MG tablet Take 1 tablet (6.25 mg total) by mouth 2 (two) times daily. 180 tablet 3  . cholecalciferol (VITAMIN D) 1000 UNITS tablet Take 1,000 Units by mouth daily.    . clopidogrel (PLAVIX) 75 MG tablet Take 1 tablet (75 mg total) by mouth daily. 90 tablet 3  . docusate sodium (COLACE) 100 MG capsule Take 100 mg by mouth 2 (two) times daily.    Marland Kitchen JARDIANCE 25 MG TABS tablet Take 25 mg by mouth daily.   1  . LANTUS SOLOSTAR 100 UNIT/ML Solostar Pen Inject 25 Units into the skin  daily.  3  . lisinopril (PRINIVIL,ZESTRIL) 40 MG tablet Take 1 tablet (40 mg total) by mouth daily. 30 tablet 6  . metFORMIN (GLUCOPHAGE-XR) 500 MG 24 hr tablet Take 1,000 mg by mouth 2 (two) times daily. 0530 & 1730  3  . methocarbamol (ROBAXIN) 500 MG tablet Take 500 mg by mouth every 8 (eight) hours. scheduled    . metroNIDAZOLE (METROGEL) 1 % gel Apply 1 application topically daily as needed (rosacea).    . nitroGLYCERIN (NITROSTAT) 0.4 MG SL tablet Place 1 tablet (0.4 mg total) under the tongue every 5 (five) minutes as needed for chest pain (CP or SOB). 25 tablet 3  . polyethylene glycol powder (MIRALAX) powder Take 17 g by mouth at bedtime as needed (constipation).     . pregabalin (LYRICA) 150 MG capsule Take 150 mg by mouth 2 (two) times daily. 0530 & 1730    . Probiotic Product (PROBIOTIC DAILY) CAPS  Take 1 capsule by mouth every evening.     . pyridOXINE (VITAMIN B-6) 100 MG tablet Take 100 mg by mouth daily.     . ranitidine (ZANTAC) 300 MG tablet Take 300 mg by mouth daily.    . sertraline (ZOLOFT) 50 MG tablet Take 50 mg by mouth 2 (two) times daily. 0530 & 1730    . traMADol (ULTRAM) 50 MG tablet Take 50 mg by mouth every 6 (six) hours. scheduled    . zolpidem (AMBIEN) 10 MG tablet Take 5 mg by mouth at bedtime.      No current facility-administered medications on file prior to visit.     Allergies  Allergen Reactions  . Victoza [Liraglutide] Other (See Comments)    pancreatitis  . Brilinta [Ticagrelor]     Dyspnea  . Codeine Other (See Comments)    hyperactive  . Other Nausea And Vomiting    Mayonnaise causes nausea and vomiting  . Statins     Muscle weakness & fatigue    Hyperlipidemia LDL-c and TG significantly above goal for secondary prevention. Diabetes management closely f/u by PCP and diabetes management pharmacist. We discussed significance of diet changes and proper diabetes management reach proper cholesterol control. Will discontinue Lovaza , start Vascepa 2g twice daily, and initiate paperwork for Repatha/Praleunt prior authorization.  We discussed Repatha/Praleunt indication, prior authorization process, storage, administration, and common side effects. Plan to follow up fasting lipids and LFTs 6-8 weeks after initiating PCSK9i therapy.   Selestino Nila Rodriguez-Guzman PharmD, BCPS, [place] [id number] 09/17/2017 10:29 AM

## 2017-09-16 NOTE — Patient Instructions (Addendum)
Cholesterol (Lipid Clinic) Pharmacist Nikia Mangino/Kristin *call [phone]*  *START paperwork for Repatha/Praluent* *Continue lifestyle modification* *Call clinic if agree to change Lovaza to Vascepa*   Cholesterol is a fat. Your body needs a small amount of cholesterol. Cholesterol (plaque) may build up in your blood vessels (arteries). That makes you more likely to have a heart attack or stroke. You cannot feel your cholesterol level. Having a blood test is the only way to find out if your level is high. Keep your test results. Work with your doctor to keep your cholesterol at a good level. What do the results mean?  Total cholesterol is how much cholesterol is in your blood.  LDL is bad cholesterol. This is the type that can build up. Try to have low LDL.  HDL is good cholesterol. It cleans your blood vessels and carries LDL away. Try to have high HDL.  Triglycerides are fat that the body can store or burn for energy. What are good levels of cholesterol?  Total cholesterol below 200.  LDL below 100 is good for people who have health risks. LDL below 70 is good for people who have very high risks.  HDL above 40 is good. It is best to have HDL of 60 or higher.  Triglycerides below 150. How can I lower my cholesterol? Diet Follow your diet program as told by your doctor.  Choose fish, white meat chicken, or [place] that is roasted or baked. Try not to eat red meat, fried foods, sausage, or lunch meats.  Eat lots of fresh fruits and vegetables.  Choose whole grains, beans, pasta, potatoes, and cereals.  Choose olive oil, corn oil, or canola oil. Only use small amounts.  Try not to eat butter, mayonnaise, shortening, or palm kernel oils.  Try not to eat foods with trans fats.  Choose low-fat or nonfat dairy foods. ? Drink skim or nonfat milk. ? Eat low-fat or nonfat yogurt and cheeses. ? Try not to drink whole milk or cream. ? Try not to eat ice cream, egg yolks, or  full-fat cheeses.  Healthy desserts include angel food cake, ginger snaps, animal crackers, hard candy, popsicles, and low-fat or nonfat frozen yogurt. Try not to eat pastries, cakes, pies, and cookies.  Exercise Follow your exercise program as told by your doctor.  Be more active. Try gardening, walking, and taking the stairs.  Ask your doctor about ways that you can be more active.  Medicine  Take over-the-counter and prescription medicines only as told by your doctor. This information is not intended to replace advice given to you by your health care provider. Make sure you discuss any questions you have with your health care provider. Document Released: 05/03/2008 Document Revised: 09/06/2015 Document Reviewed: 08/17/2015 Elsevier Interactive Patient Education  [employer].

## 2017-09-17 ENCOUNTER — Encounter: Payer: Self-pay | Admitting: Pharmacist

## 2017-09-17 MED FILL — LANTUS SOLOSTAR 100 UNITS/M: 100 | 84 days supply | Qty: 21 | Fill #0

## 2017-09-17 NOTE — Assessment & Plan Note (Signed)
LDL-c and TG significantly above goal for secondary prevention. Diabetes management closely f/u by PCP and diabetes management pharmacist. We discussed significance of diet changes and proper diabetes management reach proper cholesterol control. Will discontinue Lovaza , start Vascepa 2g twice daily, and initiate paperwork for Repatha/Praleunt prior authorization.  We discussed Repatha/Praleunt indication, prior authorization process, storage, administration, and common side effects. Plan to follow up fasting lipids and LFTs 6-8 weeks after initiating PCSK9i therapy.

## 2017-10-02 MED FILL — JARDIANCE 25 MG TABLET: 25 | 90 days supply | Qty: 90 | Fill #0

## 2017-10-10 ENCOUNTER — Other Ambulatory Visit: Payer: Self-pay | Admitting: Pharmacist

## 2017-10-10 MED ORDER — EVOLOCUMAB 140 MG/ML ~~LOC~~ SOAJ: 140.0000 mg | SUBCUTANEOUS | 11 refills | Status: DC

## 2017-10-10 MED FILL — REPATHA 140 MG/1 ML INJ: 140 | 28 days supply | Qty: 2 | Fill #0

## 2017-10-10 NOTE — Telephone Encounter (Signed)
Repatha Rx sent to prefer pharmacy. Co-pay card available

## 2017-10-15 DIAGNOSIS — E7849 Other hyperlipidemia: Secondary | ICD-10-CM | POA: Diagnosis not present

## 2017-10-15 DIAGNOSIS — Z794 Long term (current) use of insulin: Secondary | ICD-10-CM | POA: Diagnosis not present

## 2017-10-15 DIAGNOSIS — E1140 Type 2 diabetes mellitus with diabetic neuropathy, unspecified: Secondary | ICD-10-CM | POA: Diagnosis not present

## 2017-10-15 DIAGNOSIS — Z23 Encounter for immunization: Secondary | ICD-10-CM | POA: Diagnosis not present

## 2017-10-15 DIAGNOSIS — I10 Essential (primary) hypertension: Secondary | ICD-10-CM | POA: Diagnosis not present

## 2017-10-15 MED FILL — HUMALOG 100 UNITS/ML KWIKPE: 100 | 80 days supply | Qty: 12 | Fill #0

## 2017-10-21 MED FILL — PRASUGREL HCL 10 MG TABS: 10 | 90 days supply | Qty: 90 | Fill #1

## 2017-10-28 ENCOUNTER — Ambulatory Visit: Payer: 59 | Admitting: Cardiovascular Disease

## 2017-11-03 MED FILL — PENTIPS 31G X 5 MM MISC: 31G X 5 MM | 75 days supply | Qty: 300 | Fill #0

## 2017-11-03 MED FILL — REPATHA 140 MG/1 ML INJ: 140 | 28 days supply | Qty: 2 | Fill #1

## 2017-11-20 MED FILL — CARVEDILOL 6.25 MG TABLET: 6.25 | 90 days supply | Qty: 180 | Fill #1

## 2017-11-20 MED FILL — LISINOPRIL 40 MG TABLET: 40 | 90 days supply | Qty: 90 | Fill #1

## 2017-11-20 MED FILL — METHOCARBAMOL 500 MG TABLET: 500 | 90 days supply | Qty: 270 | Fill #1

## 2017-11-20 MED FILL — raNITIdine HCL 300 MG TABS: 300 | 90 days supply | Qty: 90 | Fill #1

## 2017-11-20 MED FILL — METFORMIN HCL ER 500 MG TAB: 500 | 90 days supply | Qty: 360 | Fill #1

## 2017-11-21 MED FILL — SERTRALINE HCL 50 MG TABLET: 50 | 90 days supply | Qty: 180 | Fill #0

## 2017-11-21 MED FILL — LYRICA 150 MG CAPSULE: 150 | 90 days supply | Qty: 180 | Fill #0

## 2017-11-21 MED FILL — ZOLPIDEM TARTRATE 10 MG TAB: 10 | 90 days supply | Qty: 90 | Fill #0

## 2017-11-21 MED FILL — traMADol HCL 50 MG TABS: 50 | 90 days supply | Qty: 360 | Fill #0

## 2017-11-26 MED FILL — CEFDINIR 300 MG CAPSULE: 300 | 7 days supply | Qty: 14 | Fill #0

## 2017-12-04 DIAGNOSIS — G629 Polyneuropathy, unspecified: Secondary | ICD-10-CM | POA: Diagnosis not present

## 2017-12-04 DIAGNOSIS — G5602 Carpal tunnel syndrome, left upper limb: Secondary | ICD-10-CM | POA: Diagnosis not present

## 2017-12-04 DIAGNOSIS — E7849 Other hyperlipidemia: Secondary | ICD-10-CM | POA: Diagnosis not present

## 2017-12-04 DIAGNOSIS — E1151 Type 2 diabetes mellitus with diabetic peripheral angiopathy without gangrene: Secondary | ICD-10-CM | POA: Diagnosis not present

## 2017-12-04 DIAGNOSIS — Z794 Long term (current) use of insulin: Secondary | ICD-10-CM | POA: Diagnosis not present

## 2017-12-04 DIAGNOSIS — E1140 Type 2 diabetes mellitus with diabetic neuropathy, unspecified: Secondary | ICD-10-CM | POA: Diagnosis not present

## 2017-12-04 DIAGNOSIS — I10 Essential (primary) hypertension: Secondary | ICD-10-CM | POA: Diagnosis not present

## 2017-12-04 DIAGNOSIS — I2510 Atherosclerotic heart disease of native coronary artery without angina pectoris: Secondary | ICD-10-CM | POA: Diagnosis not present

## 2017-12-04 DIAGNOSIS — M545 Low back pain: Secondary | ICD-10-CM | POA: Diagnosis not present

## 2017-12-08 DIAGNOSIS — N35919 Unspecified urethral stricture, male, unspecified site: Secondary | ICD-10-CM | POA: Diagnosis not present

## 2017-12-08 DIAGNOSIS — R339 Retention of urine, unspecified: Secondary | ICD-10-CM | POA: Diagnosis not present

## 2017-12-15 MED FILL — REPATHA 140 MG/1 ML INJ: 140 | 28 days supply | Qty: 2 | Fill #2

## 2017-12-15 MED FILL — LANTUS SOLOSTAR 100 UNITS/M: 100 | 24 days supply | Qty: 6 | Fill #1

## 2017-12-15 MED FILL — VASCEPA 1 GM CAPSULE: 1 | 90 days supply | Qty: 360 | Fill #1

## 2017-12-23 ENCOUNTER — Ambulatory Visit
Admission: RE | Admit: 2017-12-23 | Discharge: 2017-12-23 | Disposition: A | Discharge status: Home / Self Care | Payer: 59 | Source: Ambulatory Visit | Attending: Cardiovascular Disease | Admitting: Cardiovascular Disease

## 2017-12-23 ENCOUNTER — Ambulatory Visit (INDEPENDENT_AMBULATORY_CARE_PROVIDER_SITE_OTHER): Payer: 59 | Admitting: Cardiovascular Disease

## 2017-12-23 ENCOUNTER — Encounter: Payer: Self-pay | Admitting: Cardiovascular Disease

## 2017-12-23 VITALS — BP 191/86 | HR 61 | Ht 73.0 in | Wt 290.6 lb

## 2017-12-23 DIAGNOSIS — I779 Disorder of arteries and arterioles, unspecified: Secondary | ICD-10-CM

## 2017-12-23 DIAGNOSIS — Z9181 History of falling: Secondary | ICD-10-CM | POA: Diagnosis not present

## 2017-12-23 DIAGNOSIS — R079 Chest pain, unspecified: Secondary | ICD-10-CM | POA: Diagnosis not present

## 2017-12-23 DIAGNOSIS — I739 Peripheral vascular disease, unspecified: Secondary | ICD-10-CM

## 2017-12-23 DIAGNOSIS — E782 Mixed hyperlipidemia: Secondary | ICD-10-CM | POA: Diagnosis not present

## 2017-12-23 DIAGNOSIS — I2510 Atherosclerotic heart disease of native coronary artery without angina pectoris: Secondary | ICD-10-CM

## 2017-12-23 DIAGNOSIS — S299XXA Unspecified injury of thorax, initial encounter: Secondary | ICD-10-CM | POA: Diagnosis not present

## 2017-12-23 DIAGNOSIS — I10 Essential (primary) hypertension: Secondary | ICD-10-CM | POA: Diagnosis not present

## 2017-12-23 DIAGNOSIS — I6523 Occlusion and stenosis of bilateral carotid arteries: Secondary | ICD-10-CM

## 2017-12-23 LAB — HEPATIC FUNCTION PANEL
ALT: 20 IU/L (ref 0–44)
AST: 16 IU/L (ref 0–40)
Albumin: 4.3 g/dL (ref 3.6–4.8)
Alkaline Phosphatase: 73 IU/L (ref 39–117)
BILIRUBIN TOTAL: 0.4 mg/dL (ref 0.0–1.2)
Bilirubin, Direct: 0.15 mg/dL (ref 0.00–0.40)
Total Protein: 7.3 g/dL (ref 6.0–8.5)

## 2017-12-23 LAB — LIPID PANEL
Chol/HDL Ratio: 3.9 ratio (ref 0.0–5.0)
Cholesterol, Total: 135 mg/dL (ref 100–199)
HDL: 35 mg/dL — AB (ref >39)
LDL Calculated: 55 mg/dL (ref 0–99)
Triglycerides: 223 mg/dL — ABNORMAL HIGH (ref 0–149)
VLDL Cholesterol Cal: 45 mg/dL — ABNORMAL HIGH (ref 5–40)

## 2017-12-23 NOTE — Patient Instructions (Signed)
Medication Instructions:  Your physician recommends that you continue on your current medications as directed. Please refer to the Current Medication list given to you today. If you need a refill on your cardiac medications before your next appointment, please call your pharmacy.   Lab work: Your physician recommends that you return for lab work in: FASTING   If you have labs (blood work) drawn today and your tests are completely normal, you will receive your results only by: Marland Kitchen MyChart Message (if you have MyChart) OR . A paper copy in the mail If you have any lab test that is abnormal or we need to change your treatment, we will call you to review the results.  Testing/Procedures: A chest x-ray takes a picture of the organs and structures inside the chest, including the heart, lungs, and blood vessels. This test can show several things, including, whether the heart is enlarges; whether fluid is building up in the lungs; and whether pacemaker / defibrillator leads are still in place.   Follow-Up: At [facility], you and your health needs are our priority.  As part of our continuing mission to provide you with exceptional heart care, we have created designated Provider Care Teams.  These Care Teams include your primary Cardiologist (physician) and Advanced Practice Providers (APPs -  Physician Assistants and Nurse Practitioners) who all work together to provide you with the care you need, when you need it. You will need a follow up appointment in 6 months.  Please call our office 2 months in advance to schedule this appointment.  You may see  Dr. Fletcher Anon or one of the following Advanced Practice Providers on your designated Care Team:   Kerin Ransom, PA-C Roby Lofts, [place] . Sande Rives, PA-C  Any Other Special Instructions Will Be Listed Below (If Applicable).

## 2017-12-23 NOTE — Progress Notes (Signed)
Cardiology Office Note   Date:  12/23/2017   ID:  [name], DOB 05/19/1955, MRN [id number]  PCP:  Burnard Bunting, MD  Cardiologist:   Kathlyn Sacramento, MD   Chief Complaint  Patient presents with  . Follow-up    pt c/o rib pain stated he fell on saturday      History of Present Illness: Spencer [name] is a 62 y.o. male who is here today for follow-up visit regarding peripheral arterial disease and CAD. He has known history of coronary artery disease status post CABG in 2012, mild ischemic cardiomyopathy with an EF of 45%, diabetes mellitus, bilateral carotid disease, obesity hyperlipidemia with intolerance to statins and  peripheral arterial disease.  He is status post PCI and drug-eluting stent placement to both SVG to OM and SVG to RCA in 2015.  He had vascular work-up done this year. ABI were not reliable due to calcifications.  Duplex showed moderate bilateral SFA disease.  There was two-vessel runoff on the right side.  On the left side, no flow was detected in the anterior tibial, posterior tibial or peroneal arteries.  However, angiography was done in June which showed no significant aortoiliac disease.  There was mild to moderate SFA and popliteal disease.  One-vessel runoff below the knee bilaterally via the peroneal artery which gives collaterals distally to the dorsalis pedis and plantar branches of the posterior tibial artery.  No revascularization was needed.    During last visit, I switch metoprolol to carvedilol for better blood pressure control.  The patient was referred to the lipid clinic for severe mixed hyperlipidemia and was switched from [place] to [place].  Repatha was also added given his intolerance to statins.  He has been doing well overall with no recent shortness of breath, claudication or leg edema.  He fell on Saturday and since then has been having right-sided sharp chest pain which is worse when he takes a deep breath.   Past Medical History:    Diagnosis Date  . Anxiety attack   . Arthritis    "back; shoulders; knees" (07/23/2013)  . Back pain, chronic    "all over" (07/23/2013)  . Benign neoplasm of colon 04/01/2000   Hyperplastic  . Coronary artery disease    a. NSTEMI 10/12: s/p CABG with L-LAD, S-OM1/OM2, S-RV marg/RCA;  b. 07/2013 Cath/PCI: LM <10, LAD 70-80p, 81m, D1 90, RI 30ost, LCX 40-50p, 5m, OM1 90, OM2 100, RCA 100p, VG->OM1->OM2 patent with 90 @ anast (2.5x12 Xience Alpine DES), RCA 100p, LIMA->LAD nl, VG->Diag nl, VG->RV marginal/RCA 90p (3.0x23 Xience Alpine DES);  c. 07/2013 Echo: EF 55-60%.  . Depression    "pain related"  . DM2 (diabetes mellitus, type 2) ([place])   . GERD (gastroesophageal reflux disease)   . Hyperlipidemia   . Hypertension   . IBS (irritable bowel syndrome)   . Knee pain, bilateral    a. since 01/2013.  . Morbidly obese ([place])   . Myocardial infarction ([place]) 2012  . Obesity   . Pleural effusion, left 11/2010   Postop; resolved with p.o. diuresis    Past Surgical History:  Procedure Laterality Date  . ABDOMINAL AORTOGRAM W/LOWER EXTREMITY N/A 07/30/2017   Procedure: ABDOMINAL AORTOGRAM W/LOWER EXTREMITY;  Surgeon: Wellington Hampshire, MD;  Location: [facility];  Service: Cardiovascular;  Laterality: N/A;  . BACK SURGERY    . CARDIAC CATHETERIZATION  11/2010  . CARPAL TUNNEL RELEASE Bilateral   . CORONARY ANGIOPLASTY WITH STENT PLACEMENT  07/23/2013   "2"  . CORONARY ARTERY BYPASS GRAFT  11/26/10   x 6 SURGEON DR. PETER VAN TRIGT  . HERNIA REPAIR     UHR  . LEFT HEART CATHETERIZATION WITH CORONARY/GRAFT ANGIOGRAM N/A 07/23/2013   Procedure: LEFT HEART CATHETERIZATION WITH Beatrix Fetters;  Surgeon: Peter M Martinique, MD;  Location: [facility] CATH LAB;  Service: Cardiovascular;  Laterality: N/A;  . LUMBAR FUSION     MULTIPLE; DR. Ellene Route  . SHOULDER ARTHROSCOPY W/ ROTATOR CUFF REPAIR Bilateral   . UMBILICAL HERNIA REPAIR       Current Outpatient Medications  Medication Sig Dispense  Refill  . Ascorbic Acid (VITAMIN C) 1000 MG tablet Take 1,000 mg by mouth daily.    Marland Kitchen aspirin EC 81 MG tablet Take 81 mg by mouth daily.     . carvedilol (COREG) 6.25 MG tablet Take 1 tablet (6.25 mg total) by mouth 2 (two) times daily. 180 tablet 3  . cholecalciferol (VITAMIN D) 1000 UNITS tablet Take 1,000 Units by mouth daily.    Marland Kitchen docusate sodium (COLACE) 100 MG capsule Take 100 mg by mouth 2 (two) times daily.    . Evolocumab (REPATHA SURECLICK) 762 MG/ML SOAJ Inject 140 mg into the skin every 14 (fourteen) days. 2 pen 11  . Icosapent Ethyl (VASCEPA) 1 g CAPS Take 2 capsules (2 g total) by mouth 2 (two) times daily. 360 capsule 3  . JARDIANCE 25 MG TABS tablet Take 25 mg by mouth daily.   1  . LANTUS SOLOSTAR 100 UNIT/ML Solostar Pen Inject 25 Units into the skin daily.  3  . lisinopril (PRINIVIL,ZESTRIL) 40 MG tablet Take 1 tablet (40 mg total) by mouth daily. 30 tablet 6  . metFORMIN (GLUCOPHAGE-XR) 500 MG 24 hr tablet Take 1,000 mg by mouth 2 (two) times daily. 0530 & 1730  3  . methocarbamol (ROBAXIN) 500 MG tablet Take 500 mg by mouth every 8 (eight) hours. scheduled    . metroNIDAZOLE (METROGEL) 1 % gel Apply 1 application topically daily as needed (rosacea).    . nitroGLYCERIN (NITROSTAT) 0.4 MG SL tablet Place 1 tablet (0.4 mg total) under the tongue every 5 (five) minutes as needed for chest pain (CP or SOB). 25 tablet 3  . polyethylene glycol powder (MIRALAX) powder Take 17 g by mouth at bedtime as needed (constipation).     . prasugrel (EFFIENT) 10 MG TABS tablet Take 1 tablet by mouth daily.  3  . pregabalin (LYRICA) 150 MG capsule Take 150 mg by mouth 2 (two) times daily. 0530 & 1730    . Probiotic Product (PROBIOTIC DAILY) CAPS Take 1 capsule by mouth every evening.     . pyridOXINE (VITAMIN B-6) 100 MG tablet Take 100 mg by mouth daily.     . ranitidine (ZANTAC) 300 MG tablet Take 300 mg by mouth daily.    . sertraline (ZOLOFT) 50 MG tablet Take 50 mg by mouth 2 (two) times  daily. 0530 & 1730    . traMADol (ULTRAM) 50 MG tablet Take 50 mg by mouth every 6 (six) hours. scheduled    . zolpidem (AMBIEN) 10 MG tablet Take 5 mg by mouth at bedtime.      No current facility-administered medications for this visit.     Allergies:   Victoza [liraglutide]; Brilinta [ticagrelor]; Codeine; Other; and Statins    Social History:  The patient  reports that he has never smoked. He has never used smokeless tobacco. He reports that he does not drink  alcohol or use drugs.   Family History:  The patient's family history is not on file.    ROS:  Please see the history of present illness.   Otherwise, review of systems are positive for none.   All other systems are reviewed and negative.    PHYSICAL EXAM: VS:  BP (!) 191/86   Pulse 61   Ht 6\' 1"  (1.854 m)   Wt 290 lb 9.6 oz (131.8 kg)   BMI 38.34 kg/m  , BMI Body mass index is 38.34 kg/m. GEN: Well nourished, well developed, in no acute distress  HEENT: normal  Neck: no JVD, carotid bruits, or masses Cardiac: RRR; no murmurs, rubs, or gallops,no edema  Respiratory:  clear to auscultation bilaterally, normal work of breathing GI: soft, nontender, nondistended, + BS MS: no deformity or atrophy  Skin: warm and dry, no rash Neuro:  Strength and sensation are intact Psych: euthymic mood, full affect Vascular: Femoral pulses are normal bilaterally.    EKG:  EKG is not ordered today.    Recent Labs: 07/24/2017: BUN 19; Creatinine, Ser 0.92; Hemoglobin 14.2; Platelets 270; Potassium 4.4; Sodium 137 08/19/2017: ALT 22    Lipid Panel    Component Value Date/Time   CHOL 207 (H) 08/19/2017 0827   TRIG 406 (H) 08/19/2017 0827   HDL 29 (L) 08/19/2017 0827   CHOLHDL 7.1 (H) 08/19/2017 0827   CHOLHDL 7.0 07/23/2013 0155   VLDL 59 (H) 07/23/2013 0155   LDLCALC Comment 08/19/2017 0827      Wt Readings from Last 3 Encounters:  12/23/17 290 lb 9.6 oz (131.8 kg)  08/19/17 292 lb (132.5 kg)  07/30/17 287 lb (130.2 kg)         No flowsheet data found.    ASSESSMENT AND PLAN:  1.  Peripheral arterial disease: The patient has one-vessel runoff below the knee bilaterally via the peroneal artery with no other obstructive disease.  Currently with no claudication or critical limb ischemia. No revascularization is needed.  Continue aggressive medical therapy.  2.  Coronary artery disease involving native coronary arteries without angina: I suggested switching him from Effient to Plavix but the patient preferred to stay on Effient.  Given no recent stenting and the fact that he has both peripheral arterial disease and coronary artery disease, it might make more sense to switch him from Effient to low-dose Xarelto.  We can consider this in the near future.  3.  Bilateral carotid disease: Most recent carotid Doppler in June of this year showed 40 to 59% stenosis in the right side and 60 to 79% on the left.  Repeat study in June 2020  4.  Essential hypertension: I rechecked his blood pressure in the left arm with large blood pressure cuff and it was 140/80.  Continue same medications for now.  We can consider adding amlodipine in the future if needed.    5.  Hyperlipidemia: He is intolerant to all statins  Due to severe myalgia.   Continue treatment with Repatha and Vascepa.  Recheck lipid and liver profile today.  6.  Right-sided chest pain after a fall, he might have some rib fractures.  I requested a chest x-ray.   Disposition:   FU with me in 6 months  Signed,  Kathlyn Sacramento, MD  12/23/2017 8:36 AM    [place]

## 2017-12-26 ENCOUNTER — Telehealth: Payer: Self-pay | Admitting: *Deleted

## 2017-12-26 NOTE — Telephone Encounter (Signed)
Message left for the patient's wife to call back for results. Per the patient, he would like for the results to be given to the wife.

## 2017-12-26 NOTE — Telephone Encounter (Signed)
-----   Message from Wellington Hampshire, MD sent at 12/24/2017  9:56 AM EST ----- Inform patient that labs were normal. Cholesterol was excellent since starting Repatha.  Also triglyceride improved.  Continue same medications.

## 2017-12-26 NOTE — Telephone Encounter (Signed)
Patient's wife made aware of results and verbalized her understanding.

## 2017-12-31 MED FILL — JARDIANCE 25 MG TABLET: 25 | 90 days supply | Qty: 90 | Fill #0

## 2018-01-06 MED FILL — HUMALOG 100 UNITS/ML KWIKPE: 100 | 80 days supply | Qty: 12 | Fill #1

## 2018-01-06 MED FILL — LANTUS SOLOSTAR 100 UNITS/M: 100 | 24 days supply | Qty: 6 | Fill #2

## 2018-01-09 MED FILL — REPATHA 140 MG/1 ML INJ: 140 | 28 days supply | Qty: 2 | Fill #3

## 2018-01-14 DIAGNOSIS — Z6839 Body mass index (BMI) 39.0-39.9, adult: Secondary | ICD-10-CM | POA: Diagnosis not present

## 2018-01-14 DIAGNOSIS — I10 Essential (primary) hypertension: Secondary | ICD-10-CM | POA: Diagnosis not present

## 2018-01-14 DIAGNOSIS — E1151 Type 2 diabetes mellitus with diabetic peripheral angiopathy without gangrene: Secondary | ICD-10-CM | POA: Diagnosis not present

## 2018-01-14 DIAGNOSIS — Z794 Long term (current) use of insulin: Secondary | ICD-10-CM | POA: Diagnosis not present

## 2018-01-14 DIAGNOSIS — E7849 Other hyperlipidemia: Secondary | ICD-10-CM | POA: Diagnosis not present

## 2018-01-20 MED FILL — PRASUGREL HCL 10 MG TABS: 10 | 90 days supply | Qty: 90 | Fill #2

## 2018-02-06 MED FILL — REPATHA 140 MG/1 ML INJ: 140 | 28 days supply | Qty: 2 | Fill #4

## 2018-02-09 MED FILL — PENTIPS 31G X 5 MM MISC: 31G X 5 MM | 75 days supply | Qty: 300 | Fill #0

## 2018-02-09 MED FILL — LANTUS SOLOSTAR 100 UNITS/M: 100 | 24 days supply | Qty: 6 | Fill #3

## 2018-02-19 MED FILL — LISINOPRIL 40 MG TABLET: 40 | 90 days supply | Qty: 90 | Fill #2

## 2018-02-19 MED FILL — METHOCARBAMOL 500 MG TABLET: 500 | 90 days supply | Qty: 270 | Fill #2

## 2018-02-19 MED FILL — PREGABALIN 150 MG CAPS: 150 | 90 days supply | Qty: 180 | Fill #1

## 2018-02-19 MED FILL — ZOLPIDEM TARTRATE 10 MG TAB: 10 | 90 days supply | Qty: 90 | Fill #1

## 2018-02-19 MED FILL — traMADol HCL 50 MG TABS: 50 | 90 days supply | Qty: 360 | Fill #1

## 2018-02-19 MED FILL — metFORMIN HCL ER 500 MG TB2: 500 | 90 days supply | Qty: 360 | Fill #2

## 2018-02-19 MED FILL — CARVEDILOL 6.25 MG TABLET: 6.25 | 90 days supply | Qty: 180 | Fill #2

## 2018-02-19 MED FILL — SERTRALINE HCL 50 MG TABLET: 50 | 90 days supply | Qty: 180 | Fill #1

## 2018-03-09 MED FILL — LANTUS SOLOSTAR 100 UNITS/M: 100 | 84 days supply | Qty: 21 | Fill #0

## 2018-03-09 MED FILL — FAMOTIDINE 20 MG TABLET: 20 | 30 days supply | Qty: 60 | Fill #0

## 2018-03-09 MED FILL — CIPROFLOXACIN HCL 500 MG TA: 500 | 3 days supply | Qty: 6 | Fill #0

## 2018-03-11 MED FILL — VASCEPA 1 GM CAPSULE: 1 | 90 days supply | Qty: 360 | Fill #2

## 2018-03-16 MED FILL — REPATHA SURECLICK 140 MG/ML: 140 | 28 days supply | Qty: 2 | Fill #5

## 2018-03-24 DIAGNOSIS — H5212 Myopia, left eye: Secondary | ICD-10-CM | POA: Diagnosis not present

## 2018-03-24 DIAGNOSIS — H524 Presbyopia: Secondary | ICD-10-CM | POA: Diagnosis not present

## 2018-04-02 MED FILL — JARDIANCE 25 MG TABLET: 25 | 90 days supply | Qty: 90 | Fill #1

## 2018-04-15 MED FILL — REPATHA SURECLICK 140 MG/ML: 140 | 28 days supply | Qty: 2 | Fill #6 | Status: TO

## 2018-04-16 MED FILL — PRASUGREL HCL 10 MG TABS: 10 | 90 days supply | Qty: 90 | Fill #3

## 2018-04-24 MED FILL — traMADol HCL 50 MG TABS: 50 | 90 days supply | Qty: 720 | Fill #0

## 2018-05-06 DIAGNOSIS — I10 Essential (primary) hypertension: Secondary | ICD-10-CM | POA: Diagnosis not present

## 2018-05-06 DIAGNOSIS — Z794 Long term (current) use of insulin: Secondary | ICD-10-CM | POA: Diagnosis not present

## 2018-05-06 DIAGNOSIS — E1151 Type 2 diabetes mellitus with diabetic peripheral angiopathy without gangrene: Secondary | ICD-10-CM | POA: Diagnosis not present

## 2018-05-06 MED FILL — LANTUS SOLOSTAR 100 UNITS/M: 100 | 89 days supply | Qty: 24 | Fill #0

## 2018-05-11 MED FILL — UNIFINE PENTIPS 31GX3/16": 31G X 5 MM | 75 days supply | Qty: 300 | Fill #0

## 2018-05-11 MED FILL — FAMOTIDINE 20 MG TABLET: 20 | 30 days supply | Qty: 60 | Fill #0

## 2018-05-11 MED FILL — LISINOPRIL 40 MG TABLET: 40 | 90 days supply | Qty: 90 | Fill #0

## 2018-05-11 MED FILL — CARVEDILOL 6.25 MG TABLET: 6.25 | 90 days supply | Qty: 180 | Fill #0

## 2018-05-11 MED FILL — metFORMIN HCL ER 500 MG TB2: 500 | 90 days supply | Qty: 360 | Fill #0

## 2018-05-11 MED FILL — UNIFINE PENTIPS 31GX3/16: 31G X 5 MM | 75 days supply | Qty: 300 | Fill #0

## 2018-05-11 MED FILL — HUMALOG 100 UNITS/ML KWIKPE: 100 | 80 days supply | Qty: 12 | Fill #0

## 2018-05-11 MED FILL — REPATHA SURECLICK 140 MG/ML: 140 | 28 days supply | Qty: 2 | Fill #0

## 2018-05-19 DIAGNOSIS — E1151 Type 2 diabetes mellitus with diabetic peripheral angiopathy without gangrene: Secondary | ICD-10-CM | POA: Diagnosis not present

## 2018-05-19 DIAGNOSIS — E7849 Other hyperlipidemia: Secondary | ICD-10-CM | POA: Diagnosis not present

## 2018-05-19 DIAGNOSIS — R82998 Other abnormal findings in urine: Secondary | ICD-10-CM | POA: Diagnosis not present

## 2018-05-19 DIAGNOSIS — Z125 Encounter for screening for malignant neoplasm of prostate: Secondary | ICD-10-CM | POA: Diagnosis not present

## 2018-05-19 DIAGNOSIS — Z0000 Encounter for general adult medical examination without abnormal findings: Secondary | ICD-10-CM | POA: Diagnosis not present

## 2018-05-19 MED FILL — METHOCARBAMOL 500 MG TABLET: 500 | 90 days supply | Qty: 270 | Fill #0

## 2018-05-21 MED FILL — PREGABALIN 150 MG CAPS: 150 | 90 days supply | Qty: 180 | Fill #0

## 2018-05-21 MED FILL — ZOLPIDEM TARTRATE 10 MG TAB: 10 | 90 days supply | Qty: 90 | Fill #0

## 2018-05-25 DIAGNOSIS — E1151 Type 2 diabetes mellitus with diabetic peripheral angiopathy without gangrene: Secondary | ICD-10-CM | POA: Diagnosis not present

## 2018-05-25 DIAGNOSIS — K589 Irritable bowel syndrome without diarrhea: Secondary | ICD-10-CM | POA: Diagnosis not present

## 2018-05-25 DIAGNOSIS — I10 Essential (primary) hypertension: Secondary | ICD-10-CM | POA: Diagnosis not present

## 2018-05-25 DIAGNOSIS — Z794 Long term (current) use of insulin: Secondary | ICD-10-CM | POA: Diagnosis not present

## 2018-05-25 DIAGNOSIS — E1140 Type 2 diabetes mellitus with diabetic neuropathy, unspecified: Secondary | ICD-10-CM | POA: Diagnosis not present

## 2018-05-25 DIAGNOSIS — Z0000 Encounter for general adult medical examination without abnormal findings: Secondary | ICD-10-CM | POA: Diagnosis not present

## 2018-05-25 DIAGNOSIS — G4700 Insomnia, unspecified: Secondary | ICD-10-CM | POA: Diagnosis not present

## 2018-05-25 DIAGNOSIS — Z1331 Encounter for screening for depression: Secondary | ICD-10-CM | POA: Diagnosis not present

## 2018-05-25 DIAGNOSIS — I2510 Atherosclerotic heart disease of native coronary artery without angina pectoris: Secondary | ICD-10-CM | POA: Diagnosis not present

## 2018-05-25 DIAGNOSIS — G629 Polyneuropathy, unspecified: Secondary | ICD-10-CM | POA: Diagnosis not present

## 2018-05-25 DIAGNOSIS — M545 Low back pain: Secondary | ICD-10-CM | POA: Diagnosis not present

## 2018-05-25 DIAGNOSIS — E785 Hyperlipidemia, unspecified: Secondary | ICD-10-CM | POA: Diagnosis not present

## 2018-05-25 MED FILL — buPROPion HCL ER (XL) 150 M: 150 | 90 days supply | Qty: 90 | Fill #0

## 2018-05-25 MED FILL — DOXYCYCLINE HYCLATE 100 MG: 100 | 7 days supply | Qty: 14 | Fill #0

## 2018-06-11 MED FILL — VASCEPA 1 GM CAPSULE: 1 | 90 days supply | Qty: 360 | Fill #0

## 2018-06-19 MED FILL — REPATHA SURECLICK 140 MG/ML: 140 | 28 days supply | Qty: 2 | Fill #1 | Status: TO

## 2018-06-22 MED FILL — SERTRALINE HCL 50 MG TABLET: 50 | 90 days supply | Qty: 180 | Fill #0

## 2018-06-23 ENCOUNTER — Ambulatory Visit: Payer: 59 | Admitting: Cardiovascular Disease

## 2018-06-25 MED FILL — JARDIANCE 25 MG TABLET: 25 | 90 days supply | Qty: 90 | Fill #0

## 2018-06-30 MED FILL — SM ACID REDUCER 10 MG TABS: 10 | 30 days supply | Qty: 120 | Fill #0

## 2018-07-08 MED FILL — PRASUGREL HCL 10 MG TABS: 10 | 90 days supply | Qty: 90 | Fill #0

## 2018-07-20 MED FILL — REPATHA SURECLICK 140 MG/ML: 140 | 28 days supply | Qty: 2 | Fill #0

## 2018-07-24 MED FILL — traMADol HCL 50 MG TABS: 50 | 90 days supply | Qty: 720 | Fill #1

## 2018-07-31 ENCOUNTER — Telehealth: Payer: Self-pay | Admitting: Cardiovascular Disease

## 2018-07-31 NOTE — Telephone Encounter (Signed)
LVM for patient to offer Virtual

## 2018-07-31 NOTE — Telephone Encounter (Signed)
Patient will do Video by his smartphone/ declined my chart/ consent/ pre reg completed

## 2018-08-04 ENCOUNTER — Encounter: Payer: Self-pay | Admitting: Cardiovascular Disease

## 2018-08-04 ENCOUNTER — Telehealth (INDEPENDENT_AMBULATORY_CARE_PROVIDER_SITE_OTHER): Payer: 59 | Admitting: Cardiovascular Disease

## 2018-08-04 ENCOUNTER — Ambulatory Visit (HOSPITAL_COMMUNITY)
Admission: RE | Admit: 2018-08-04 | Discharge: 2018-08-04 | Disposition: A | Discharge status: Home / Self Care | Payer: 59 | Source: Ambulatory Visit | Attending: Cardiovascular Disease | Admitting: Cardiovascular Disease

## 2018-08-04 ENCOUNTER — Other Ambulatory Visit: Payer: Self-pay

## 2018-08-04 ENCOUNTER — Other Ambulatory Visit (HOSPITAL_COMMUNITY): Payer: Self-pay | Admitting: Cardiovascular Disease

## 2018-08-04 VITALS — BP 138/74 | HR 82 | Ht 73.0 in | Wt 290.0 lb

## 2018-08-04 DIAGNOSIS — I6523 Occlusion and stenosis of bilateral carotid arteries: Secondary | ICD-10-CM

## 2018-08-04 DIAGNOSIS — I739 Peripheral vascular disease, unspecified: Secondary | ICD-10-CM

## 2018-08-04 DIAGNOSIS — I2510 Atherosclerotic heart disease of native coronary artery without angina pectoris: Secondary | ICD-10-CM

## 2018-08-04 DIAGNOSIS — I779 Disorder of arteries and arterioles, unspecified: Secondary | ICD-10-CM

## 2018-08-04 NOTE — Patient Instructions (Signed)
Medication Instructions:  Your physician recommends that you continue on your current medications as directed. Please refer to the Current Medication list given to you today.  If you need a refill on your cardiac medications before your next appointment, please call your pharmacy.   Lab work: NONE   Testing/Procedures: NONE  Follow-Up: At [facility], you and your health needs are our priority.  As part of our continuing mission to provide you with exceptional heart care, we have created designated Provider Care Teams.  These Care Teams include your primary Cardiologist (physician) and Advanced Practice Providers (APPs -  Physician Assistants and Nurse Practitioners) who all work together to provide you with the care you need, when you need it. You will need a follow up appointment in 6 months.  Please call our office 2 months in advance to schedule this appointment.  You may see Kathlyn Sacramento, MD or one of the following Advanced Practice Providers on your designated Care Team:   Kerin Ransom, PA-C Roby Lofts, [place] . Sande Rives, PA-C

## 2018-08-04 NOTE — Progress Notes (Signed)
Virtual Visit via Video Note   This visit type was conducted due to national recommendations for restrictions regarding the COVID-19 Pandemic (e.g. social distancing) in an effort to limit this patient's exposure and mitigate transmission in our community.  Due to his co-morbid illnesses, this patient is at least at moderate risk for complications without adequate follow up.  This format is felt to be most appropriate for this patient at this time.  All issues noted in this document were discussed and addressed.  A limited physical exam was performed with this format.  Please refer to the patient's chart for his consent to telehealth for [facility].   Date:  08/04/2018   ID:  Spencer [name], DOB [birth year], MRN [id number]  Patient Location: Home Provider Location: Office  PCP:  Burnard Bunting, MD  Cardiologist:  Kathlyn Sacramento, MD  Electrophysiologist:  None   Evaluation Performed:  Follow-Up Visit  Chief Complaint: Doing well with no complaints  History of Present Illness:    Spencer [name] is a 63 y.o. male was seen via video visit for follow-up regarding peripheral arterial disease and CAD. He has known history of coronary artery disease status post CABG in 2012, mild ischemic cardiomyopathy with an EF of 45%, diabetes mellitus, bilateral carotid disease, obesity, hyperlipidemia with intolerance to statins and  peripheral arterial disease.  He is status post PCI and drug-eluting stent placement to both SVG to OM and SVG to RCA in 2015.  He had vascular work-up done in 2019. ABI were not reliable due to calcifications.  Angiography in June 2019 showed no significant aortoiliac disease.  There was mild to moderate SFA and popliteal disease.  One-vessel runoff below the knee bilaterally via the peroneal artery which gives collaterals distally to the dorsalis pedis and plantar branches of the posterior tibial artery.  No revascularization was needed.    He has been doing extremely  well with no recent chest pain, shortness of breath or palpitations.  He denies leg claudication.  His lipid profile improved significantly with Repatha and Vascepa.  The patient does not have symptoms concerning for COVID-19 infection (fever, chills, cough, or new shortness of breath).    Past Medical History:  Diagnosis Date   Anxiety attack    Arthritis    "back; shoulders; knees" (07/23/2013)   Back pain, chronic    "all over" (07/23/2013)   Benign neoplasm of colon 04/01/2000   Hyperplastic   Coronary artery disease    a. NSTEMI 10/12: s/p CABG with L-LAD, S-OM1/OM2, S-RV marg/RCA;  b. 07/2013 Cath/PCI: LM <10, LAD 70-80p, 66m, D1 90, RI 30ost, LCX 40-50p, 72m, OM1 90, OM2 100, RCA 100p, VG->OM1->OM2 patent with 90 @ anast (2.5x12 Xience Alpine DES), RCA 100p, LIMA->LAD nl, VG->Diag nl, VG->RV marginal/RCA 90p (3.0x23 Xience Alpine DES);  c. 07/2013 Echo: EF 55-60%.   Depression    "pain related"   DM2 (diabetes mellitus, type 2) ([facility])    GERD (gastroesophageal reflux disease)    Hyperlipidemia    Hypertension    IBS (irritable bowel syndrome)    Knee pain, bilateral    a. since 01/2013.   Morbidly obese ([place])    Myocardial infarction ([place]) 2012   Obesity    Pleural effusion, left 11/2010   Postop; resolved with p.o. diuresis   Past Surgical History:  Procedure Laterality Date   ABDOMINAL AORTOGRAM W/LOWER EXTREMITY N/A 07/30/2017   Procedure: ABDOMINAL AORTOGRAM W/LOWER EXTREMITY;  Surgeon: Wellington Hampshire, MD;  Location: [facility]  [facility];  Service: Cardiovascular;  Laterality: N/A;   BACK SURGERY     CARDIAC CATHETERIZATION  11/2010   CARPAL TUNNEL RELEASE Bilateral    CORONARY ANGIOPLASTY WITH STENT PLACEMENT  07/23/2013   "2"   CORONARY ARTERY BYPASS GRAFT  11/26/10   x 6 SURGEON DR. PETER VAN TRIGT   HERNIA REPAIR     UHR   LEFT HEART CATHETERIZATION WITH CORONARY/GRAFT ANGIOGRAM N/A 07/23/2013   Procedure: LEFT HEART CATHETERIZATION WITH  Beatrix Fetters;  Surgeon: Peter M Martinique, MD;  Location: [facility] CATH LAB;  Service: Cardiovascular;  Laterality: N/A;   LUMBAR FUSION     MULTIPLE; DR. Ellene Route   SHOULDER ARTHROSCOPY W/ ROTATOR CUFF REPAIR Bilateral    UMBILICAL HERNIA REPAIR       No outpatient medications have been marked as taking for the 08/04/18 encounter (Telemedicine) with Wellington Hampshire, MD.     Allergies:   Victoza [liraglutide], Brilinta [ticagrelor], Codeine, Other, and Statins   Social History   Tobacco Use   Smoking status: Never Smoker   Smokeless tobacco: Never Used  Substance Use Topics   Alcohol use: No   Drug use: No     Family Hx: The patient's family history is negative for Hypertension, Heart disease, Peripheral vascular disease, and Diabetes.  ROS:   Please see the history of present illness.     All other systems reviewed and are negative.   Prior CV studies:   The following studies were reviewed today:    Labs/Other Tests and Data Reviewed:    EKG:  No ECG reviewed.  Recent Labs: 12/23/2017: ALT 20   Recent Lipid Panel Lab Results  Component Value Date/Time   CHOL 135 12/23/2017 09:07 AM   TRIG 223 (H) 12/23/2017 09:07 AM   HDL 35 (L) 12/23/2017 09:07 AM   CHOLHDL 3.9 12/23/2017 09:07 AM   CHOLHDL 7.0 07/23/2013 01:55 AM   LDLCALC 55 12/23/2017 09:07 AM    Wt Readings from Last 3 Encounters:  08/04/18 290 lb (131.5 kg)  12/23/17 290 lb 9.6 oz (131.8 kg)  08/19/17 292 lb (132.5 kg)     Objective:    Vital Signs:  BP 138/74    Pulse 82    Ht 6\' 1"  (1.854 m)    Wt 290 lb (131.5 kg)    BMI 38.26 kg/m    VITAL SIGNS:  reviewed GEN:  no acute distress EYES:  sclerae anicteric, EOMI - Extraocular Movements Intact RESPIRATORY:  normal respiratory effort, symmetric expansion SKIN:  no rash, lesions or ulcers. MUSCULOSKELETAL:  no obvious deformities. NEURO:  alert and oriented x 3, no obvious focal deficit PSYCH:  normal affect  ASSESSMENT & PLAN:      1.  Peripheral arterial disease: The patient has one-vessel runoff below the knee bilaterally via the peroneal artery with no other obstructive disease.  Currently with no claudication or critical limb ischemia. No revascularization is needed.  Continue aggressive medical therapy.  2.  Coronary artery disease involving native coronary arteries without angina: He continues to be on dual antiplatelet therapy with aspirin and Effient given previous PCI on vein grafts.  He prefers to stay on this versus switching to Plavix or even low-dose Xarelto given PAD and CAD.  We can revisit this during his next visit.  3.  Bilateral carotid disease: Most recent carotid Doppler in June of this year showed 40 to 59% stenosis in the right side and 60 to 79% on the left.  Repeat study  today  4.  Essential hypertension: Blood pressure is reasonably controlled now after adjusting his antihypertensive medications.  5.  Hyperlipidemia: He is intolerant to all statins  Due to severe myalgia.   Continue treatment with Repatha and Vascepa.   Follow-up lipid profile showed significant improvement.  COVID-19 Education: The signs and symptoms of COVID-19 were discussed with the patient and how to seek care for testing (follow up with PCP or arrange E-visit).  The importance of social distancing was discussed today.  Time:   Today, I have spent 10 minutes with the patient with telehealth technology discussing the above problems.     Medication Adjustments/Labs and Tests Ordered: Current medicines are reviewed at length with the patient today.  Concerns regarding medicines are outlined above.   Tests Ordered: No orders of the defined types were placed in this encounter.   Medication Changes: No orders of the defined types were placed in this encounter.   Follow Up:  In Person in 6 month(s)  Signed, Kathlyn Sacramento, MD  08/04/2018 8:47 AM    [place]

## 2018-08-11 ENCOUNTER — Other Ambulatory Visit: Payer: Self-pay | Admitting: Cardiovascular Disease

## 2018-08-11 MED FILL — LANTUS SOLOSTAR 100 UNITS/M: 100 | 89 days supply | Qty: 24 | Fill #0

## 2018-08-11 MED FILL — METFORMIN HCL ER 500 MG TB2: 500 | 30 days supply | Qty: 120 | Fill #0

## 2018-08-11 MED FILL — HUMALOG 100 UNITS/ML KWIKPE: 100 | 80 days supply | Qty: 12 | Fill #1

## 2018-08-11 MED FILL — CARVEDILOL 6.25 MG TABLET: 6.25 | 90 days supply | Qty: 180 | Fill #0

## 2018-08-11 MED FILL — LISINOPRIL 40 MG TABLET: 40 | 90 days supply | Qty: 90 | Fill #0

## 2018-08-11 NOTE — Telephone Encounter (Signed)
Please review for refill. Thanks!  

## 2018-08-12 MED FILL — REPATHA SURECLICK 140 MG/ML: 140 | 28 days supply | Qty: 2 | Fill #1

## 2018-08-14 MED FILL — METHOCARBAMOL 500 MG TABLET: 500 | 90 days supply | Qty: 270 | Fill #0

## 2018-08-24 MED FILL — PREGABALIN 150 MG CAPS: 150 | 90 days supply | Qty: 180 | Fill #0

## 2018-08-25 DIAGNOSIS — Z794 Long term (current) use of insulin: Secondary | ICD-10-CM | POA: Diagnosis not present

## 2018-08-25 DIAGNOSIS — I10 Essential (primary) hypertension: Secondary | ICD-10-CM | POA: Diagnosis not present

## 2018-08-25 DIAGNOSIS — E1151 Type 2 diabetes mellitus with diabetic peripheral angiopathy without gangrene: Secondary | ICD-10-CM | POA: Diagnosis not present

## 2018-08-26 DIAGNOSIS — E1151 Type 2 diabetes mellitus with diabetic peripheral angiopathy without gangrene: Secondary | ICD-10-CM | POA: Diagnosis not present

## 2018-08-28 MED FILL — ZOLPIDEM TARTRATE 10 MG TAB: 10 | 90 days supply | Qty: 90 | Fill #0

## 2018-09-08 MED FILL — VASCEPA 1 GM CAPSULE: 1 | 90 days supply | Qty: 360 | Fill #0

## 2018-09-16 MED FILL — REPATHA SURECLICK 140 MG/ML: 140 | 28 days supply | Qty: 2 | Fill #2

## 2018-09-23 MED FILL — SERTRALINE HCL 50 MG TABS: 50 | 90 days supply | Qty: 180 | Fill #0

## 2018-09-23 MED FILL — JARDIANCE 25 MG TABLET: 25 | 90 days supply | Qty: 90 | Fill #0

## 2018-09-24 DIAGNOSIS — N35011 Post-traumatic bulbous urethral stricture: Secondary | ICD-10-CM | POA: Diagnosis not present

## 2018-10-02 MED FILL — PRASUGREL HCL 10 MG TABS: 10 | 90 days supply | Qty: 90 | Fill #0

## 2018-10-07 MED FILL — METFORMIN HCL ER 500 MG TB2: 500 | 90 days supply | Qty: 360 | Fill #0

## 2018-10-19 ENCOUNTER — Other Ambulatory Visit: Payer: Self-pay | Admitting: Cardiovascular Disease

## 2018-10-19 MED FILL — REPATHA SURECLICK 140 MG/ML: 140 | 28 days supply | Qty: 2 | Fill #0

## 2018-10-19 NOTE — Telephone Encounter (Deleted)
Please review for refill. Thanks!  

## 2018-10-19 NOTE — Telephone Encounter (Signed)
Please review for refill. Thanks!  

## 2018-10-27 MED FILL — HUMALOG 100 UNITS/ML KWIKPE: 100 | 80 days supply | Qty: 12 | Fill #0

## 2018-10-27 MED FILL — traMADol HCL 50 MG TABS: 50 | 90 days supply | Qty: 720 | Fill #0

## 2018-11-04 MED FILL — PENTIPS 31G X 5 MM MISC: 31G X 5 MM | 75 days supply | Qty: 300 | Fill #0

## 2018-11-04 MED FILL — CARVEDILOL 6.25 MG TABLET: 6.25 | 90 days supply | Qty: 180 | Fill #0

## 2018-11-04 MED FILL — FAMOTIDINE 20 MG TABLET: 20 | 30 days supply | Qty: 60 | Fill #0

## 2018-11-04 MED FILL — LISINOPRIL 40 MG TABLET: 40 | 90 days supply | Qty: 90 | Fill #0

## 2018-11-04 MED FILL — LANTUS SOLOSTAR 100 UNITS/M: 100 | 89 days supply | Qty: 24 | Fill #0

## 2018-11-06 MED FILL — METHOCARBAMOL 500 MG TABS: 500 | 90 days supply | Qty: 270 | Fill #0

## 2018-11-19 MED FILL — REPATHA SURECLICK 140 MG/ML: 140 | 28 days supply | Qty: 2 | Fill #1

## 2018-11-25 DIAGNOSIS — E1151 Type 2 diabetes mellitus with diabetic peripheral angiopathy without gangrene: Secondary | ICD-10-CM | POA: Diagnosis not present

## 2018-11-25 DIAGNOSIS — E785 Hyperlipidemia, unspecified: Secondary | ICD-10-CM | POA: Diagnosis not present

## 2018-11-25 DIAGNOSIS — G629 Polyneuropathy, unspecified: Secondary | ICD-10-CM | POA: Diagnosis not present

## 2018-11-25 DIAGNOSIS — Z794 Long term (current) use of insulin: Secondary | ICD-10-CM | POA: Diagnosis not present

## 2018-11-25 DIAGNOSIS — I10 Essential (primary) hypertension: Secondary | ICD-10-CM | POA: Diagnosis not present

## 2018-11-25 DIAGNOSIS — M545 Low back pain: Secondary | ICD-10-CM | POA: Diagnosis not present

## 2018-11-25 DIAGNOSIS — I739 Peripheral vascular disease, unspecified: Secondary | ICD-10-CM | POA: Diagnosis not present

## 2018-11-25 DIAGNOSIS — G4700 Insomnia, unspecified: Secondary | ICD-10-CM | POA: Diagnosis not present

## 2018-11-25 DIAGNOSIS — N35011 Post-traumatic bulbous urethral stricture: Secondary | ICD-10-CM | POA: Diagnosis not present

## 2018-11-25 DIAGNOSIS — I6523 Occlusion and stenosis of bilateral carotid arteries: Secondary | ICD-10-CM | POA: Diagnosis not present

## 2018-11-25 DIAGNOSIS — E1140 Type 2 diabetes mellitus with diabetic neuropathy, unspecified: Secondary | ICD-10-CM | POA: Diagnosis not present

## 2018-11-25 DIAGNOSIS — I2510 Atherosclerotic heart disease of native coronary artery without angina pectoris: Secondary | ICD-10-CM | POA: Diagnosis not present

## 2018-12-15 DIAGNOSIS — R82998 Other abnormal findings in urine: Secondary | ICD-10-CM | POA: Diagnosis not present

## 2018-12-15 DIAGNOSIS — G629 Polyneuropathy, unspecified: Secondary | ICD-10-CM | POA: Diagnosis not present

## 2018-12-15 DIAGNOSIS — K589 Irritable bowel syndrome without diarrhea: Secondary | ICD-10-CM | POA: Diagnosis not present

## 2018-12-15 DIAGNOSIS — E1151 Type 2 diabetes mellitus with diabetic peripheral angiopathy without gangrene: Secondary | ICD-10-CM | POA: Diagnosis not present

## 2018-12-15 DIAGNOSIS — Z23 Encounter for immunization: Secondary | ICD-10-CM | POA: Diagnosis not present

## 2018-12-15 DIAGNOSIS — I10 Essential (primary) hypertension: Secondary | ICD-10-CM | POA: Diagnosis not present

## 2018-12-15 DIAGNOSIS — Z794 Long term (current) use of insulin: Secondary | ICD-10-CM | POA: Diagnosis not present

## 2018-12-15 DIAGNOSIS — R300 Dysuria: Secondary | ICD-10-CM | POA: Diagnosis not present

## 2018-12-15 MED FILL — REPATHA SURECLICK 140 MG/ML: 140 | 28 days supply | Qty: 2 | Fill #2

## 2018-12-15 MED FILL — PREGABALIN 150 MG CAPS: 150 | 90 days supply | Qty: 180 | Fill #0

## 2018-12-15 MED FILL — VASCEPA 1 GM CAPSULE: 1 | 90 days supply | Qty: 360 | Fill #1

## 2018-12-16 MED FILL — ZOLPIDEM TARTRATE 10 MG TAB: 10 | 90 days supply | Qty: 90 | Fill #1

## 2018-12-22 MED FILL — JARDIANCE 25 MG TABLET: 25 | 90 days supply | Qty: 90 | Fill #1

## 2018-12-23 MED FILL — SERTRALINE HCL 50 MG TABS: 50 | 90 days supply | Qty: 180 | Fill #0

## 2018-12-31 MED FILL — PRASUGREL HCL 10 MG TABS: 10 | 90 days supply | Qty: 90 | Fill #1

## 2018-12-31 MED FILL — FAMOTIDINE 20 MG TABLET: 20 | 30 days supply | Qty: 60 | Fill #1

## 2019-01-05 MED FILL — METFORMIN HCL ER 500 MG TB2: 500 | 90 days supply | Qty: 360 | Fill #1

## 2019-01-09 MED FILL — HUMALOG 100 UNITS/ML KWIKPE: 100 | 80 days supply | Qty: 12 | Fill #1

## 2019-01-19 ENCOUNTER — Other Ambulatory Visit: Payer: Self-pay | Admitting: Cardiovascular Disease

## 2019-01-19 MED FILL — REPATHA SURECLICK 140 MG/ML: 140 | 28 days supply | Qty: 2 | Fill #0

## 2019-01-19 NOTE — Telephone Encounter (Signed)
Please review for refill. Thank you! 

## 2019-01-29 MED FILL — FAMOTIDINE 20 MG TABS: 20 | 30 days supply | Qty: 60 | Fill #2

## 2019-01-29 MED FILL — CARVEDILOL 6.25 MG TABLET: 6.25 | 90 days supply | Qty: 180 | Fill #1

## 2019-01-29 MED FILL — LISINOPRIL 40 MG TABLET: 40 | 90 days supply | Qty: 90 | Fill #1

## 2019-02-01 MED FILL — LANTUS SOLOSTAR 100 UNITS/M: 100 | 89 days supply | Qty: 24 | Fill #1

## 2019-02-01 MED FILL — METHOCARBAMOL 500 MG TABLET: 500 | 90 days supply | Qty: 270 | Fill #1

## 2019-02-01 MED FILL — traMADol HCL 50 MG TABS: 50 | 90 days supply | Qty: 720 | Fill #0

## 2019-02-06 DIAGNOSIS — R339 Retention of urine, unspecified: Secondary | ICD-10-CM | POA: Diagnosis not present

## 2019-02-06 DIAGNOSIS — N35919 Unspecified urethral stricture, male, unspecified site: Secondary | ICD-10-CM | POA: Diagnosis not present

## 2019-02-22 MED FILL — REPATHA SURECLICK 140 MG/ML: 140 | 28 days supply | Qty: 2 | Fill #1

## 2019-02-23 ENCOUNTER — Telehealth (INDEPENDENT_AMBULATORY_CARE_PROVIDER_SITE_OTHER): Payer: 59 | Admitting: Cardiovascular Disease

## 2019-02-23 ENCOUNTER — Encounter: Payer: Self-pay | Admitting: Cardiovascular Disease

## 2019-02-23 VITALS — BP 134/84 | HR 69 | Ht 73.0 in | Wt 284.2 lb

## 2019-02-23 DIAGNOSIS — Z955 Presence of coronary angioplasty implant and graft: Secondary | ICD-10-CM

## 2019-02-23 DIAGNOSIS — Z951 Presence of aortocoronary bypass graft: Secondary | ICD-10-CM

## 2019-02-23 DIAGNOSIS — I2510 Atherosclerotic heart disease of native coronary artery without angina pectoris: Secondary | ICD-10-CM | POA: Diagnosis not present

## 2019-02-23 DIAGNOSIS — Z7902 Long term (current) use of antithrombotics/antiplatelets: Secondary | ICD-10-CM | POA: Diagnosis not present

## 2019-02-23 DIAGNOSIS — I10 Essential (primary) hypertension: Secondary | ICD-10-CM

## 2019-02-23 DIAGNOSIS — I779 Disorder of arteries and arterioles, unspecified: Secondary | ICD-10-CM

## 2019-02-23 DIAGNOSIS — Z792 Long term (current) use of antibiotics: Secondary | ICD-10-CM | POA: Diagnosis not present

## 2019-02-23 DIAGNOSIS — E782 Mixed hyperlipidemia: Secondary | ICD-10-CM

## 2019-02-23 DIAGNOSIS — I739 Peripheral vascular disease, unspecified: Secondary | ICD-10-CM | POA: Diagnosis not present

## 2019-02-23 DIAGNOSIS — E785 Hyperlipidemia, unspecified: Secondary | ICD-10-CM

## 2019-02-23 DIAGNOSIS — Z7189 Other specified counseling: Secondary | ICD-10-CM

## 2019-02-23 NOTE — Progress Notes (Signed)
Virtual Visit via Phone Note   This visit type was conducted due to national recommendations for restrictions regarding the COVID-19 Pandemic (e.g. social distancing) in an effort to limit this patient's exposure and mitigate transmission in our community.  Due to his co-morbid illnesses, this patient is at least at moderate risk for complications without adequate follow up.  This format is felt to be most appropriate for this patient at this time.  All issues noted in this document were discussed and addressed.  A limited physical exam was performed with this format.  Please refer to the patient's chart for his consent to telehealth for [facility].   Date:  02/23/2019   ID:  Spencer [name], DOB [birth year], MRN [id number]  Patient Location: Home Provider Location: Office  PCP:  Burnard Bunting, MD  Cardiologist:  Kathlyn Sacramento, MD  Electrophysiologist:  None   Evaluation Performed:  Follow-Up Visit  Chief Complaint: Doing well with no complaints  History of Present Illness:    Spencer [name] is a 64 y.o. male who was reached via phone for a follow-up visit  regarding peripheral arterial disease and CAD.  I could not connect via video.  He has known history of coronary artery disease status post CABG in 2012, mild ischemic cardiomyopathy with an EF of 45%, diabetes mellitus, bilateral carotid disease, obesity, hyperlipidemia with intolerance to statins and  peripheral arterial disease.  He is status post PCI and drug-eluting stent placement to both SVG to OM and SVG to RCA in 2015.  He had vascular work-up done in 2019. ABI were not reliable due to calcifications.  Angiography in June 2019 showed no significant aortoiliac disease.  There was mild to moderate SFA and popliteal disease.  One-vessel runoff below the knee bilaterally via the peroneal artery which gives collaterals distally to the dorsalis pedis and plantar branches of the posterior tibial artery.  No revascularization was  needed.    He has been doing extremely well with no recent chest pain, shortness of breath or palpitations.  He denies leg claudication. He has been taking his medications regularly with no reported side effects. He resumed swimming exercises at the [facility] 2 months ago and he has been feeling well.  The patient does not have symptoms concerning for COVID-19 infection (fever, chills, cough, or new shortness of breath).    Past Medical History:  Diagnosis Date  . Anxiety attack   . Arthritis    "back; shoulders; knees" (07/23/2013)  . Back pain, chronic    "all over" (07/23/2013)  . Benign neoplasm of colon 04/01/2000   Hyperplastic  . Coronary artery disease    a. NSTEMI 10/12: s/p CABG with L-LAD, S-OM1/OM2, S-RV marg/RCA;  b. 07/2013 Cath/PCI: LM <10, LAD 70-80p, 34m, D1 90, RI 30ost, LCX 40-50p, 77m, OM1 90, OM2 100, RCA 100p, VG->OM1->OM2 patent with 90 @ anast (2.5x12 Xience Alpine DES), RCA 100p, LIMA->LAD nl, VG->Diag nl, VG->RV marginal/RCA 90p (3.0x23 Xience Alpine DES);  c. 07/2013 Echo: EF 55-60%.  . Depression    "pain related"  . DM2 (diabetes mellitus, type 2) ([place])   . GERD (gastroesophageal reflux disease)   . Hyperlipidemia   . Hypertension   . IBS (irritable bowel syndrome)   . Knee pain, bilateral    a. since 01/2013.  . Morbidly obese ([place])   . Myocardial infarction ([place]) 2012  . Obesity   . Pleural effusion, left 11/2010   Postop; resolved with p.o. diuresis   Past Surgical History:  Procedure Laterality Date  . ABDOMINAL AORTOGRAM W/LOWER EXTREMITY N/A 07/30/2017   Procedure: ABDOMINAL AORTOGRAM W/LOWER EXTREMITY;  Surgeon: Wellington Hampshire, MD;  Location: [facility];  Service: Cardiovascular;  Laterality: N/A;  . BACK SURGERY    . CARDIAC CATHETERIZATION  11/2010  . CARPAL TUNNEL RELEASE Bilateral   . CORONARY ANGIOPLASTY WITH STENT PLACEMENT  07/23/2013   "2"  . CORONARY ARTERY BYPASS GRAFT  11/26/10   x 6 SURGEON DR. PETER VAN TRIGT  . HERNIA REPAIR      UHR  . LEFT HEART CATHETERIZATION WITH CORONARY/GRAFT ANGIOGRAM N/A 07/23/2013   Procedure: LEFT HEART CATHETERIZATION WITH Beatrix Fetters;  Surgeon: Peter M Martinique, MD;  Location[facility];  Service: Cardiovascular;  Laterality: N/A;  . LUMBAR FUSION     MULTIPLE; DR. Ellene Route  . SHOULDER ARTHROSCOPY W/ ROTATOR CUFF REPAIR Bilateral   . UMBILICAL HERNIA REPAIR       Current Meds  Medication Sig  . Ascorbic Acid (VITAMIN C) 1000 MG tablet Take 1,000 mg by mouth daily.  Marland Kitchen aspirin EC 81 MG tablet Take 81 mg by mouth daily.   . carvedilol (COREG) 6.25 MG tablet TAKE 1 TABLET BY MOUTH TWICE DAILY  . cholecalciferol (VITAMIN D) 1000 UNITS tablet Take 1,000 Units by mouth daily.  Marland Kitchen docusate sodium (COLACE) 100 MG capsule Take 100 mg by mouth 2 (two) times daily.  . famotidine (PEPCID) 10 MG tablet Take 20 mg by mouth 2 (two) times daily.  Vanessa Kick Ethyl (VASCEPA) 1 g CAPS Take 2 capsules (2 g total) by mouth 2 (two) times daily.  Marland Kitchen JARDIANCE 25 MG TABS tablet Take 25 mg by mouth daily.   Marland Kitchen LANTUS SOLOSTAR 100 UNIT/ML Solostar Pen Inject 27 Units into the skin daily.   Marland Kitchen lisinopril (PRINIVIL,ZESTRIL) 40 MG tablet Take 1 tablet (40 mg total) by mouth daily.  . metFORMIN (GLUCOPHAGE-XR) 500 MG 24 hr tablet Take 1,000 mg by mouth 2 (two) times daily. 0530 & 1730  . methocarbamol (ROBAXIN) 500 MG tablet Take 500 mg by mouth every 8 (eight) hours. scheduled  . metroNIDAZOLE (METROGEL) 1 % gel Apply 1 application topically daily as needed (rosacea).  . nitroGLYCERIN (NITROSTAT) 0.4 MG SL tablet Place 1 tablet (0.4 mg total) under the tongue every 5 (five) minutes as needed for chest pain (CP or SOB).  . polyethylene glycol powder (MIRALAX) powder Take 17 g by mouth at bedtime as needed (constipation).   . prasugrel (EFFIENT) 10 MG TABS tablet Take 1 tablet by mouth daily.  . pregabalin (LYRICA) 150 MG capsule Take 150 mg by mouth 2 (two) times daily. 0530 & 1730  . Probiotic Product  (PROBIOTIC DAILY) CAPS Take 1 capsule by mouth every evening.   . pyridOXINE (VITAMIN B-6) 100 MG tablet Take 100 mg by mouth daily.   Marland Kitchen REPATHA SURECLICK XX123456 MG/ML SOAJ INJECT 140 MG INTO THE SKIN EVERY 14 DAYS.  Marland Kitchen sertraline (ZOLOFT) 50 MG tablet Take 50 mg by mouth 2 (two) times daily. 0530 & 1730  . traMADol (ULTRAM) 50 MG tablet Take 100 mg by mouth every 6 (six) hours. scheduled  . zolpidem (AMBIEN) 10 MG tablet Take 5 mg by mouth at bedtime.      Allergies:   Victoza [liraglutide], Brilinta [ticagrelor], Codeine, Other, and Statins   Social History   Tobacco Use  . Smoking status: Never Smoker  . Smokeless tobacco: Never Used  Substance Use Topics  . Alcohol use: No  .  Drug use: No     Family Hx: The patient's family history is negative for Hypertension, Heart disease, Peripheral vascular disease, and Diabetes.  ROS:   Please see the history of present illness.     All other systems reviewed and are negative.   Prior CV studies:   The following studies were reviewed today:    Labs/Other Tests and Data Reviewed:    EKG:  No ECG reviewed.  Recent Labs: No results found for requested labs within last 8760 hours.   Recent Lipid Panel Lab Results  Component Value Date/Time   CHOL 135 12/23/2017 09:07 AM   TRIG 223 (H) 12/23/2017 09:07 AM   HDL 35 (L) 12/23/2017 09:07 AM   CHOLHDL 3.9 12/23/2017 09:07 AM   CHOLHDL 7.0 07/23/2013 01:55 AM   LDLCALC 55 12/23/2017 09:07 AM    Wt Readings from Last 3 Encounters:  02/23/19 284 lb 3.2 oz (128.9 kg)  08/04/18 290 lb (131.5 kg)  12/23/17 290 lb 9.6 oz (131.8 kg)     Objective:    Vital Signs:  BP 134/84   Pulse 69   Ht 6\' 1"  (1.854 m)   Wt 284 lb 3.2 oz (128.9 kg)   BMI 37.50 kg/m    VITAL SIGNS:  reviewed   ASSESSMENT & PLAN:    1.  Peripheral arterial disease: The patient has one-vessel runoff below the knee bilaterally via the peroneal artery with no other obstructive disease.  Currently with no  claudication or critical limb ischemia. No revascularization is needed.  Continue aggressive medical therapy.  2.  Coronary artery disease involving native coronary arteries without angina: He continues to be on dual antiplatelet therapy with aspirin and Effient given previous PCI on vein grafts.   No bleeding issues.  Most recent labs in October showed no evidence of anemia.  3.  Bilateral carotid disease: Most recent carotid Doppler in June of this year showed 40 to 59% stenosis in the right side and 60 to 79% on the left.  Repeat study in June 2021  4.  Essential hypertension: Blood pressure is well controlled on current medications.  5.  Hyperlipidemia: He is intolerant to all statins  Due to severe myalgia.   Continue treatment with Repatha and Vascepa.   I reviewed most recent lipid profile in March which showed an LDL of 36 and triglyceride of 224 which has improved from before.   COVID-19 Education: The signs and symptoms of COVID-19 were discussed with the patient and how to seek care for testing (follow up with PCP or arrange E-visit).  The importance of social distancing was discussed today.  Time:   Today, I have spent 10 minutes with the patient with telehealth technology discussing the above problems.     Medication Adjustments/Labs and Tests Ordered: Current medicines are reviewed at length with the patient today.  Concerns regarding medicines are outlined above.   Tests Ordered: No orders of the defined types were placed in this encounter.   Medication Changes: No orders of the defined types were placed in this encounter.   Follow Up:  In Person in 6 month(s)  Signed, Kathlyn Sacramento, MD  02/23/2019 8:15 AM    [place]

## 2019-02-23 NOTE — Patient Instructions (Addendum)
Medication Instructions:  Continue same medications *If you need a refill on your cardiac medications before your next appointment, please call your pharmacy*  Lab Work: None If you have labs (blood work) drawn today and your tests are completely normal, you will receive your results only by: Marland Kitchen MyChart Message (if you have MyChart) OR . A paper copy in the mail If you have any lab test that is abnormal or we need to change your treatment, we will call you to review the results.  Testing/Procedures: Carotid Doppler in 6 months  Follow-Up: At [facility], you and your health needs are our priority.  As part of our continuing mission to provide you with exceptional heart care, we have created designated Provider Care Teams.  These Care Teams include your primary Cardiologist (physician) and Advanced Practice Providers (APPs -  Physician Assistants and Nurse Practitioners) who all work together to provide you with the care you need, when you need it.  Your next appointment:   6 month(s)  The format for your next appointment:   In Person  Provider:   Kathlyn Sacramento, MD or Kerin Ransom, PA-C

## 2019-03-16 MED FILL — VASCEPA 1 GM CAPSULE: 1 | 90 days supply | Qty: 360 | Fill #2

## 2019-03-17 MED FILL — PREGABALIN 150 MG CAPS: 150 | 90 days supply | Qty: 180 | Fill #0

## 2019-03-17 MED FILL — ZOLPIDEM TARTRATE 10 MG TAB: 10 | 90 days supply | Qty: 90 | Fill #0

## 2019-03-17 MED FILL — FAMOTIDINE 20 MG TABS: 20 | 90 days supply | Qty: 180 | Fill #0

## 2019-03-19 MED FILL — REPATHA SURECLICK 140 MG/ML: 140 | 28 days supply | Qty: 2 | Fill #2

## 2019-03-23 MED FILL — SERTRALINE HCL 50 MG TABLET: 50 | 90 days supply | Qty: 180 | Fill #1

## 2019-03-23 MED FILL — JARDIANCE 25 MG TABLET: 25 | 90 days supply | Qty: 90 | Fill #2

## 2019-03-30 DIAGNOSIS — H524 Presbyopia: Secondary | ICD-10-CM | POA: Diagnosis not present

## 2019-04-01 MED FILL — PRASUGREL HCL 10 MG TABS: 10 | 90 days supply | Qty: 90 | Fill #2

## 2019-04-01 MED FILL — metFORMIN HCL ER 500 MG TB2: 500 | 90 days supply | Qty: 360 | Fill #2

## 2019-04-20 MED FILL — REPATHA SURECLICK 140 MG/ML: 140 | 28 days supply | Qty: 2 | Fill #3

## 2019-04-23 DIAGNOSIS — M169 Osteoarthritis of hip, unspecified: Secondary | ICD-10-CM | POA: Diagnosis not present

## 2019-04-23 DIAGNOSIS — S76011A Strain of muscle, fascia and tendon of right hip, initial encounter: Secondary | ICD-10-CM | POA: Diagnosis not present

## 2019-04-23 DIAGNOSIS — M25551 Pain in right hip: Secondary | ICD-10-CM | POA: Diagnosis not present

## 2019-05-04 ENCOUNTER — Other Ambulatory Visit: Payer: Self-pay | Admitting: Cardiovascular Disease

## 2019-05-04 MED FILL — LISINOPRIL 40 MG TABLET: 40 | 90 days supply | Qty: 90 | Fill #2

## 2019-05-04 MED FILL — CARVEDILOL 6.25 MG TABLET: 6.25 | 90 days supply | Qty: 180 | Fill #0

## 2019-05-05 MED FILL — LANTUS SOLOSTAR 100 UNITS/M: 100 | 89 days supply | Qty: 24 | Fill #2

## 2019-05-05 MED FILL — PENTIPS 31G X 5 MM MISC: 31G X 5 MM | 75 days supply | Qty: 300 | Fill #0

## 2019-05-05 MED FILL — FREESTYLE LITE TEST STRIP: 90 days supply | Qty: 200 | Fill #0

## 2019-05-06 MED FILL — traMADol HCL 50 MG TABS: 50 | 90 days supply | Qty: 720 | Fill #0

## 2019-05-06 MED FILL — METHOCARBAMOL 500 MG TABS: 500 | 90 days supply | Qty: 270 | Fill #0

## 2019-05-15 DIAGNOSIS — M25551 Pain in right hip: Secondary | ICD-10-CM | POA: Diagnosis not present

## 2019-05-25 MED FILL — REPATHA SURECLICK 140 MG/ML: 140 | 28 days supply | Qty: 2 | Fill #4

## 2019-06-01 DIAGNOSIS — E1151 Type 2 diabetes mellitus with diabetic peripheral angiopathy without gangrene: Secondary | ICD-10-CM | POA: Diagnosis not present

## 2019-06-01 DIAGNOSIS — Z125 Encounter for screening for malignant neoplasm of prostate: Secondary | ICD-10-CM | POA: Diagnosis not present

## 2019-06-01 DIAGNOSIS — Z0000 Encounter for general adult medical examination without abnormal findings: Secondary | ICD-10-CM | POA: Diagnosis not present

## 2019-06-01 DIAGNOSIS — R82998 Other abnormal findings in urine: Secondary | ICD-10-CM | POA: Diagnosis not present

## 2019-06-01 DIAGNOSIS — E7849 Other hyperlipidemia: Secondary | ICD-10-CM | POA: Diagnosis not present

## 2019-06-01 DIAGNOSIS — I10 Essential (primary) hypertension: Secondary | ICD-10-CM | POA: Diagnosis not present

## 2019-06-02 DIAGNOSIS — G4700 Insomnia, unspecified: Secondary | ICD-10-CM | POA: Diagnosis not present

## 2019-06-02 DIAGNOSIS — E1151 Type 2 diabetes mellitus with diabetic peripheral angiopathy without gangrene: Secondary | ICD-10-CM | POA: Diagnosis not present

## 2019-06-02 DIAGNOSIS — I2510 Atherosclerotic heart disease of native coronary artery without angina pectoris: Secondary | ICD-10-CM | POA: Diagnosis not present

## 2019-06-02 DIAGNOSIS — Z794 Long term (current) use of insulin: Secondary | ICD-10-CM | POA: Diagnosis not present

## 2019-06-02 DIAGNOSIS — E785 Hyperlipidemia, unspecified: Secondary | ICD-10-CM | POA: Diagnosis not present

## 2019-06-02 DIAGNOSIS — I739 Peripheral vascular disease, unspecified: Secondary | ICD-10-CM | POA: Diagnosis not present

## 2019-06-02 DIAGNOSIS — E1140 Type 2 diabetes mellitus with diabetic neuropathy, unspecified: Secondary | ICD-10-CM | POA: Diagnosis not present

## 2019-06-02 DIAGNOSIS — M545 Low back pain: Secondary | ICD-10-CM | POA: Diagnosis not present

## 2019-06-02 DIAGNOSIS — I10 Essential (primary) hypertension: Secondary | ICD-10-CM | POA: Diagnosis not present

## 2019-06-02 DIAGNOSIS — I6523 Occlusion and stenosis of bilateral carotid arteries: Secondary | ICD-10-CM | POA: Diagnosis not present

## 2019-06-02 DIAGNOSIS — G629 Polyneuropathy, unspecified: Secondary | ICD-10-CM | POA: Diagnosis not present

## 2019-06-02 DIAGNOSIS — Z0000 Encounter for general adult medical examination without abnormal findings: Secondary | ICD-10-CM | POA: Diagnosis not present

## 2019-06-02 MED FILL — metroNIDAZOLE 1 % GEL: 1 | 30 days supply | Qty: 60 | Fill #0

## 2019-06-07 DIAGNOSIS — Z0000 Encounter for general adult medical examination without abnormal findings: Secondary | ICD-10-CM | POA: Diagnosis not present

## 2019-06-14 MED FILL — ZOLPIDEM TARTRATE 10 MG TAB: 10 | 90 days supply | Qty: 90 | Fill #1

## 2019-06-14 MED FILL — JARDIANCE 25 MG TABLET: 25 | 90 days supply | Qty: 90 | Fill #3

## 2019-06-14 MED FILL — SERTRALINE HCL 50 MG TABLET: 50 | 90 days supply | Qty: 180 | Fill #0

## 2019-06-14 MED FILL — VASCEPA 1 GM CAPSULE: 1 | 90 days supply | Qty: 360 | Fill #3

## 2019-06-14 MED FILL — FAMOTIDINE 20 MG TABS: 20 | 90 days supply | Qty: 180 | Fill #1

## 2019-06-14 MED FILL — PREGABALIN 150 MG CAPS: 150 | 90 days supply | Qty: 180 | Fill #0

## 2019-06-15 MED FILL — HUMALOG 100 UNITS/ML KWIKPE: 100 | 80 days supply | Qty: 12 | Fill #0

## 2019-06-25 ENCOUNTER — Other Ambulatory Visit (HOSPITAL_BASED_OUTPATIENT_CLINIC_OR_DEPARTMENT_OTHER): Payer: Self-pay | Admitting: Internal Medicine

## 2019-06-25 DIAGNOSIS — K921 Melena: Secondary | ICD-10-CM | POA: Diagnosis not present

## 2019-06-25 MED FILL — PRASUGREL HCL 10 MG TABS: 10 | 90 days supply | Qty: 90 | Fill #0

## 2019-07-14 DIAGNOSIS — Z1212 Encounter for screening for malignant neoplasm of rectum: Secondary | ICD-10-CM | POA: Diagnosis not present

## 2019-07-28 ENCOUNTER — Other Ambulatory Visit (HOSPITAL_BASED_OUTPATIENT_CLINIC_OR_DEPARTMENT_OTHER): Payer: Self-pay | Admitting: Internal Medicine

## 2019-08-02 ENCOUNTER — Other Ambulatory Visit (HOSPITAL_BASED_OUTPATIENT_CLINIC_OR_DEPARTMENT_OTHER): Payer: Self-pay | Admitting: Internal Medicine

## 2019-08-02 MED FILL — BASAGLAR 100 UNIT/ML KWIKPE: 100 | 89 days supply | Qty: 24 | Fill #0

## 2019-08-04 ENCOUNTER — Encounter (HOSPITAL_COMMUNITY): Payer: 59

## 2019-08-10 MED FILL — BASAGLAR 100 UNIT/ML KWIKPE: 100 | 89 days supply | Qty: 24 | Fill #0

## 2019-08-10 MED FILL — traMADol HCL 50 MG TABS: 50 | 90 days supply | Qty: 720 | Fill #0

## 2019-08-10 MED FILL — METHOCARBAMOL 500 MG TABS: 500 | 90 days supply | Qty: 270 | Fill #1

## 2019-08-11 ENCOUNTER — Ambulatory Visit (HOSPITAL_COMMUNITY)
Admission: RE | Admit: 2019-08-11 | Discharge: 2019-08-11 | Disposition: A | Discharge status: Home / Self Care | Payer: 59 | Source: Ambulatory Visit | Attending: Cardiology | Admitting: Cardiology

## 2019-08-11 ENCOUNTER — Other Ambulatory Visit: Payer: Self-pay

## 2019-08-11 ENCOUNTER — Other Ambulatory Visit (HOSPITAL_COMMUNITY): Payer: Self-pay | Admitting: Cardiovascular Disease

## 2019-08-11 DIAGNOSIS — I6523 Occlusion and stenosis of bilateral carotid arteries: Secondary | ICD-10-CM

## 2019-08-18 ENCOUNTER — Other Ambulatory Visit (HOSPITAL_BASED_OUTPATIENT_CLINIC_OR_DEPARTMENT_OTHER): Payer: Self-pay | Admitting: Internal Medicine

## 2019-08-18 MED FILL — metFORMIN HCL ER 500 MG TB2: 500 | 90 days supply | Qty: 360 | Fill #0

## 2019-08-23 NOTE — Progress Notes (Signed)
Cardiology Office Note   Date:  08/24/2019   ID:  ORYN CASANOVA, DOB [birth year], MRN [id number]  PCP:  Burnard Bunting, MD  Cardiologist:   Kathlyn Sacramento, MD   No chief complaint on file.     History of Present Illness: Spencer [name] is a 64 y.o. male who is here today for follow-up visit regarding peripheral arterial disease and CAD. He has known history of coronary artery disease status post CABG in 2012, mild ischemic cardiomyopathy with an EF of 45%, diabetes mellitus, bilateral carotid disease, obesity, hyperlipidemia with intolerance to statins and peripheral arterial disease.  He is status post PCI and drug-eluting stent placement to both SVG to OM and SVG to RCA in 2015.  He had vascular work-up done in 2019. ABI were not reliable due to calcifications.  Angiography in June 2019 showed no significant aortoiliac disease.  There was mild to moderate SFA and popliteal disease.  One-vessel runoff below the knee bilaterally via the peroneal artery which gave collaterals distally to the dorsalis pedis and plantar branches of the posterior tibial artery.  No revascularization was needed.    He has been doing very well with no recent chest pain, shortness of breath or palpitations.  No leg claudication or ulceration.  He takes his medications regularly.  Diabetes control improved significantly with most recent hemoglobin A1c of 5.4.    Past Medical History:  Diagnosis Date  . Anxiety attack   . Arthritis    "back; shoulders; knees" (07/23/2013)  . Back pain, chronic    "all over" (07/23/2013)  . Benign neoplasm of colon 04/01/2000   Hyperplastic  . Coronary artery disease    a. NSTEMI 10/12: s/p CABG with L-LAD, S-OM1/OM2, S-RV marg/RCA;  b. 07/2013 Cath/PCI: LM <10, LAD 70-80p, 57m, D1 90, RI 30ost, LCX 40-50p, 41m, OM1 90, OM2 100, RCA 100p, VG->OM1->OM2 patent with 90 @ anast (2.5x12 Xience Alpine DES), RCA 100p, LIMA->LAD nl, VG->Diag nl, VG->RV marginal/RCA 90p (3.0x23 Xience  Alpine DES);  c. 07/2013 Echo: EF 55-60%.  . Depression    "pain related"  . DM2 (diabetes mellitus, type 2) ([place])   . GERD (gastroesophageal reflux disease)   . Hyperlipidemia   . Hypertension   . IBS (irritable bowel syndrome)   . Knee pain, bilateral    a. since 01/2013.  . Morbidly obese ([place])   . Myocardial infarction ([place]) 2012  . Obesity   . Pleural effusion, left 11/2010   Postop; resolved with p.o. diuresis    Past Surgical History:  Procedure Laterality Date  . ABDOMINAL AORTOGRAM W/LOWER EXTREMITY N/A 07/30/2017   Procedure: ABDOMINAL AORTOGRAM W/LOWER EXTREMITY;  Surgeon: Wellington Hampshire, MD;  Location: [facility];  Service: Cardiovascular;  Laterality: N/A;  . BACK SURGERY    . CARDIAC CATHETERIZATION  11/2010  . CARPAL TUNNEL RELEASE Bilateral   . CORONARY ANGIOPLASTY WITH STENT PLACEMENT  07/23/2013   "2"  . CORONARY ARTERY BYPASS GRAFT  11/26/10   x 6 SURGEON DR. PETER VAN TRIGT  . HERNIA REPAIR     UHR  . LEFT HEART CATHETERIZATION WITH CORONARY/GRAFT ANGIOGRAM N/A 07/23/2013   Procedure: LEFT HEART CATHETERIZATION WITH Beatrix Fetters;  Surgeon: Peter M Martinique, MD;  Location[facility] CATH LAB;  Service: Cardiovascular;  Laterality: N/A;  . LUMBAR FUSION     MULTIPLE; DR. Ellene Route  . SHOULDER ARTHROSCOPY W/ ROTATOR CUFF REPAIR Bilateral   . UMBILICAL HERNIA REPAIR       Current  Outpatient Medications  Medication Sig Dispense Refill  . Ascorbic Acid (VITAMIN C) 1000 MG tablet Take 1,000 mg by mouth daily.    Marland Kitchen aspirin EC 81 MG tablet Take 81 mg by mouth daily.     . carvedilol (COREG) 6.25 MG tablet TAKE 1 TABLET BY MOUTH TWICE DAILY 180 tablet 1  . cholecalciferol (VITAMIN D) 1000 UNITS tablet Take 1,000 Units by mouth daily.    Marland Kitchen docusate sodium (COLACE) 100 MG capsule Take 100 mg by mouth 2 (two) times daily.    . famotidine (PEPCID) 10 MG tablet Take 20 mg by mouth 2 (two) times daily.    . famotidine (PEPCID) 20 MG tablet Take 20 mg by mouth 2  (two) times daily.    Marland Kitchen FREESTYLE LITE test strip     . HUMALOG KWIKPEN 100 UNIT/ML KwikPen Inject into the skin.    Vanessa Kick Ethyl (VASCEPA) 1 g CAPS Take 2 capsules (2 g total) by mouth 2 (two) times daily. 360 capsule 3  . JARDIANCE 25 MG TABS tablet Take 25 mg by mouth daily.   1  . LANTUS SOLOSTAR 100 UNIT/ML Solostar Pen Inject 27 Units into the skin daily.   3  . lisinopril (PRINIVIL,ZESTRIL) 40 MG tablet Take 1 tablet (40 mg total) by mouth daily. 30 tablet 6  . metFORMIN (GLUCOPHAGE-XR) 500 MG 24 hr tablet Take 1,000 mg by mouth 2 (two) times daily. 0530 & 1730  3  . methocarbamol (ROBAXIN) 500 MG tablet Take 500 mg by mouth every 8 (eight) hours. scheduled    . metroNIDAZOLE (METROGEL) 1 % gel Apply 1 application topically daily as needed (rosacea).    . nitroGLYCERIN (NITROSTAT) 0.4 MG SL tablet Place 1 tablet (0.4 mg total) under the tongue every 5 (five) minutes as needed for chest pain (CP or SOB). 25 tablet 3  . PENTIPS 31G X 5 MM MISC     . polyethylene glycol powder (MIRALAX) powder Take 17 g by mouth at bedtime as needed (constipation).     . prasugrel (EFFIENT) 10 MG TABS tablet Take 1 tablet by mouth daily.  3  . pregabalin (LYRICA) 150 MG capsule Take 150 mg by mouth 2 (two) times daily. 0530 & 1730    . Probiotic Product (PROBIOTIC DAILY) CAPS Take 1 capsule by mouth every evening.     . pyridOXINE (VITAMIN B-6) 100 MG tablet Take 100 mg by mouth daily.     Marland Kitchen REPATHA SURECLICK 154 MG/ML SOAJ INJECT 140 MG INTO THE SKIN EVERY 14 DAYS. 2 pen 11  . sertraline (ZOLOFT) 50 MG tablet Take 50 mg by mouth 2 (two) times daily. 0530 & 1730    . traMADol (ULTRAM) 50 MG tablet Take 100 mg by mouth every 6 (six) hours. scheduled    . zolpidem (AMBIEN) 10 MG tablet Take 5 mg by mouth at bedtime.      No current facility-administered medications for this visit.    Allergies:   Victoza [liraglutide], Brilinta [ticagrelor], Codeine, Other, and Statins    Social History:  The  patient  reports that he has never smoked. He has never used smokeless tobacco. He reports that he does not drink alcohol and does not use drugs.   Family History:  The patient's family history is not on file.    ROS:  Please see the history of present illness.   Otherwise, review of systems are positive for none.   All other systems are reviewed and negative.  PHYSICAL EXAM: VS:  BP 134/86   Pulse 69   Ht 6\' 1"  (1.854 m)   Wt 288 lb 12.8 oz (131 kg)   SpO2 95%   BMI 38.10 kg/m  , BMI Body mass index is 38.1 kg/m. GEN: Well nourished, well developed, in no acute distress  HEENT: normal  Neck: no JVD, carotid bruits, or masses Cardiac: RRR; no murmurs, rubs, or gallops,no edema  Respiratory:  clear to auscultation bilaterally, normal work of breathing GI: soft, nontender, nondistended, + BS MS: no deformity or atrophy  Skin: warm and dry, no rash Neuro:  Strength and sensation are intact Psych: euthymic mood, full affect Vascular: Femoral pulses are normal bilaterally.    EKG:  EKG is ordered today. EKG showed normal sinus rhythm with right bundle branch block.   Recent Labs: No results found for requested labs within last 8760 hours.    Lipid Panel    Component Value Date/Time   CHOL 135 12/23/2017 0907   TRIG 223 (H) 12/23/2017 0907   HDL 35 (L) 12/23/2017 0907   CHOLHDL 3.9 12/23/2017 0907   CHOLHDL 7.0 07/23/2013 0155   VLDL 59 (H) 07/23/2013 0155   LDLCALC 55 12/23/2017 0907      Wt Readings from Last 3 Encounters:  08/24/19 288 lb 12.8 oz (131 kg)  02/23/19 284 lb 3.2 oz (128.9 kg)  08/04/18 290 lb (131.5 kg)       No flowsheet data found.    ASSESSMENT AND PLAN:  1.  Peripheral arterial disease: The patient has one-vessel runoff below the knee bilaterally via the peroneal artery with no other obstructive disease.  Currently with no claudication or critical limb ischemia. No revascularization is needed.  Continue aggressive medical  therapy.  2.  Coronary artery disease involving native coronary arteries without angina: He continues to be on dual antiplatelet therapy with aspirin and Effient given previous PCI on vein grafts.   No bleeding issues.  Recent labs showed normal hemoglobin and creatinine was 1.0.  3.  Bilateral carotid disease: Discussed results of recent carotid Doppler which showed 40 to 59% stenosis in the right side and 60 to 79% on the left.  This has been stable since last year.  Repeat study in 1 year.  4.  Essential hypertension: Blood pressure is well controlled on current medications.  5.  Hyperlipidemia: He is intolerant to all statins due to severe myalgia.   Continue treatment with Repatha and Vascepa.   Most recent lipid profile showed an LDL of 15 and triglyceride of 337.  6.  The patient had questions about the COVID-19 vaccine and I had a prolonged discussion with him about the importance of taking it especially with his chronic medical conditions.  I answered all his questions.   Disposition:   FU with me in 6 months  Signed,  Kathlyn Sacramento, MD  08/24/2019 8:40 AM    [place]

## 2019-08-24 ENCOUNTER — Other Ambulatory Visit: Payer: Self-pay

## 2019-08-24 ENCOUNTER — Ambulatory Visit (INDEPENDENT_AMBULATORY_CARE_PROVIDER_SITE_OTHER): Payer: 59 | Admitting: Cardiovascular Disease

## 2019-08-24 ENCOUNTER — Encounter: Payer: Self-pay | Admitting: Cardiovascular Disease

## 2019-08-24 VITALS — BP 134/86 | HR 69 | Ht 73.0 in | Wt 288.8 lb

## 2019-08-24 DIAGNOSIS — I10 Essential (primary) hypertension: Secondary | ICD-10-CM | POA: Diagnosis not present

## 2019-08-24 DIAGNOSIS — I2510 Atherosclerotic heart disease of native coronary artery without angina pectoris: Secondary | ICD-10-CM

## 2019-08-24 DIAGNOSIS — E785 Hyperlipidemia, unspecified: Secondary | ICD-10-CM

## 2019-08-24 DIAGNOSIS — I739 Peripheral vascular disease, unspecified: Secondary | ICD-10-CM

## 2019-08-24 DIAGNOSIS — I779 Disorder of arteries and arterioles, unspecified: Secondary | ICD-10-CM | POA: Diagnosis not present

## 2019-08-24 DIAGNOSIS — I6523 Occlusion and stenosis of bilateral carotid arteries: Secondary | ICD-10-CM

## 2019-08-24 NOTE — Patient Instructions (Signed)

## 2019-09-01 DIAGNOSIS — Z794 Long term (current) use of insulin: Secondary | ICD-10-CM | POA: Diagnosis not present

## 2019-09-01 DIAGNOSIS — E1151 Type 2 diabetes mellitus with diabetic peripheral angiopathy without gangrene: Secondary | ICD-10-CM | POA: Diagnosis not present

## 2019-09-01 DIAGNOSIS — I10 Essential (primary) hypertension: Secondary | ICD-10-CM | POA: Diagnosis not present

## 2019-09-02 MED FILL — REPATHA SURECLICK 140 MG/ML: 140 | 28 days supply | Qty: 2 | Fill #7

## 2019-09-09 ENCOUNTER — Other Ambulatory Visit (HOSPITAL_BASED_OUTPATIENT_CLINIC_OR_DEPARTMENT_OTHER): Payer: Self-pay | Admitting: Internal Medicine

## 2019-09-09 MED FILL — JARDIANCE 25 MG TABLET: 25 | 90 days supply | Qty: 90 | Fill #0

## 2019-09-09 MED FILL — VASCEPA 1 GM CAPSULE: 1 | 90 days supply | Qty: 360 | Fill #0

## 2019-09-09 MED FILL — PREGABALIN 150 MG CAPS: 150 | 90 days supply | Qty: 180 | Fill #1

## 2019-09-09 MED FILL — SERTRALINE HCL 50 MG TABLET: 50 | 90 days supply | Qty: 180 | Fill #1

## 2019-09-09 MED FILL — FAMOTIDINE 20 MG TABS: 20 | 90 days supply | Qty: 360 | Fill #0

## 2019-09-10 MED FILL — ZOLPIDEM TARTRATE 10 MG TAB: 10 | 90 days supply | Qty: 90 | Fill #0

## 2019-09-28 MED FILL — PRASUGREL HCL 10 MG TABS: 10 | 90 days supply | Qty: 90 | Fill #1

## 2019-09-29 DIAGNOSIS — Z23 Encounter for immunization: Secondary | ICD-10-CM | POA: Diagnosis not present

## 2019-10-04 DIAGNOSIS — N200 Calculus of kidney: Secondary | ICD-10-CM | POA: Diagnosis not present

## 2019-10-04 DIAGNOSIS — N35011 Post-traumatic bulbous urethral stricture: Secondary | ICD-10-CM | POA: Diagnosis not present

## 2019-10-05 MED FILL — REPATHA SURECLICK 140 MG/ML: 140 | 28 days supply | Qty: 2 | Fill #8

## 2019-10-22 ENCOUNTER — Other Ambulatory Visit: Payer: Self-pay | Admitting: Cardiovascular Disease

## 2019-10-22 MED FILL — CARVEDILOL 6.25 MG TABLET: 6.25 | 90 days supply | Qty: 180 | Fill #0

## 2019-10-22 MED FILL — LISINOPRIL 40 MG TABS: 40 | 90 days supply | Qty: 90 | Fill #1

## 2019-10-27 MED FILL — BASAGLAR 100 UNIT/ML KWIKPE: 100 | 89 days supply | Qty: 24 | Fill #1

## 2019-11-08 DIAGNOSIS — K589 Irritable bowel syndrome without diarrhea: Secondary | ICD-10-CM | POA: Diagnosis not present

## 2019-11-08 DIAGNOSIS — R109 Unspecified abdominal pain: Secondary | ICD-10-CM | POA: Diagnosis not present

## 2019-11-08 DIAGNOSIS — K5900 Constipation, unspecified: Secondary | ICD-10-CM | POA: Diagnosis not present

## 2019-11-08 MED FILL — REPATHA SURECLICK 140 MG/ML: 140 | 28 days supply | Qty: 2 | Fill #9

## 2019-11-09 ENCOUNTER — Other Ambulatory Visit: Payer: Self-pay | Admitting: Internal Medicine

## 2019-11-09 ENCOUNTER — Ambulatory Visit
Admission: RE | Admit: 2019-11-09 | Discharge: 2019-11-09 | Disposition: A | Discharge status: Home / Self Care | Payer: 59 | Source: Ambulatory Visit | Attending: Internal Medicine | Admitting: Internal Medicine

## 2019-11-09 DIAGNOSIS — R109 Unspecified abdominal pain: Secondary | ICD-10-CM

## 2019-11-09 DIAGNOSIS — K5900 Constipation, unspecified: Secondary | ICD-10-CM

## 2019-11-09 DIAGNOSIS — K589 Irritable bowel syndrome without diarrhea: Secondary | ICD-10-CM

## 2019-11-09 DIAGNOSIS — N200 Calculus of kidney: Secondary | ICD-10-CM | POA: Diagnosis not present

## 2019-11-09 DIAGNOSIS — I700 Atherosclerosis of aorta: Secondary | ICD-10-CM | POA: Diagnosis not present

## 2019-11-09 MED ORDER — IOPAMIDOL (ISOVUE-300) INJECTION 61%
100.0000 mL | Freq: Once | INTRAVENOUS | Status: AC | PRN
  Administered 2019-11-09: 100 mL via INTRAVENOUS

## 2019-11-10 MED FILL — traMADol HCL 50 MG TABS: 50 | 90 days supply | Qty: 720 | Fill #0

## 2019-11-10 MED FILL — METHOCARBAMOL 500 MG TABS: 500 | 90 days supply | Qty: 270 | Fill #2

## 2019-11-16 ENCOUNTER — Ambulatory Visit: Payer: 59 | Admitting: Physician Assistant

## 2019-11-17 MED FILL — FLUARIX QUADRIVALENT 0.5 ML: 0.5 | 1 days supply | Qty: 1 | Fill #0

## 2019-11-18 ENCOUNTER — Ambulatory Visit (INDEPENDENT_AMBULATORY_CARE_PROVIDER_SITE_OTHER): Payer: 59 | Admitting: Physician Assistant

## 2019-11-18 ENCOUNTER — Encounter: Payer: Self-pay | Admitting: Physician Assistant

## 2019-11-18 ENCOUNTER — Other Ambulatory Visit: Payer: Self-pay | Admitting: Physician Assistant

## 2019-11-18 VITALS — BP 130/78 | HR 71 | Ht 73.0 in | Wt 289.0 lb

## 2019-11-18 DIAGNOSIS — K5900 Constipation, unspecified: Secondary | ICD-10-CM

## 2019-11-18 DIAGNOSIS — I6523 Occlusion and stenosis of bilateral carotid arteries: Secondary | ICD-10-CM

## 2019-11-18 DIAGNOSIS — R1084 Generalized abdominal pain: Secondary | ICD-10-CM | POA: Diagnosis not present

## 2019-11-18 DIAGNOSIS — R109 Unspecified abdominal pain: Secondary | ICD-10-CM

## 2019-11-18 MED ORDER — SUTAB 1479-225-188 MG PO TABS: 1.0000 | ORAL_TABLET | ORAL | 0 refills | Status: DC

## 2019-11-18 MED FILL — SUTAB 1479-225-188 MG TABS: 1479-225-18 | 1 days supply | Qty: 24 | Fill #0

## 2019-11-18 NOTE — Patient Instructions (Signed)
If you are age 62 or older, your body mass index should be between 23-30. Your Body mass index is 38.13 kg/m. If this is out of the aforementioned range listed, please consider follow up with your Primary Care Provider.  If you are age 68 or younger, your body mass index should be between 19-25. Your Body mass index is 38.13 kg/m. If this is out of the aformentioned range listed, please consider follow up with your Primary Care Provider.   You have been scheduled for an endoscopy and colonoscopy. Please follow the written instructions given to you at your visit today. Please pick up your prep supplies at the pharmacy within the next 1-3 days. If you use inhalers (even only as needed), please bring them with you on the day of your procedure.  Continue Miralax 17 grams in 8 ounces of water or juice every day, you may increase to twice a day if needed.   You will be contacted by our office prior to your procedure for directions on holding your Effient.  If you do not hear from our office 1 week prior to your scheduled procedure, please call [phone] to discuss.   Follow up pending the results of your Colonoscopy/Endoscopy or as needed.

## 2019-11-19 ENCOUNTER — Observation Stay (HOSPITAL_BASED_OUTPATIENT_CLINIC_OR_DEPARTMENT_OTHER)
Admission: EM | Admit: 2019-11-19 | Discharge: 2019-11-20 | Disposition: A | Discharge status: Home / Self Care | Payer: 59 | Attending: Internal Medicine | Admitting: Internal Medicine

## 2019-11-19 ENCOUNTER — Other Ambulatory Visit: Payer: Self-pay

## 2019-11-19 ENCOUNTER — Encounter (HOSPITAL_BASED_OUTPATIENT_CLINIC_OR_DEPARTMENT_OTHER): Payer: Self-pay | Admitting: Emergency Medicine

## 2019-11-19 ENCOUNTER — Emergency Department (HOSPITAL_BASED_OUTPATIENT_CLINIC_OR_DEPARTMENT_OTHER): Payer: 59

## 2019-11-19 ENCOUNTER — Encounter: Payer: Self-pay | Admitting: Physician Assistant

## 2019-11-19 DIAGNOSIS — Z20822 Contact with and (suspected) exposure to covid-19: Secondary | ICD-10-CM | POA: Insufficient documentation

## 2019-11-19 DIAGNOSIS — S2241XA Multiple fractures of ribs, right side, initial encounter for closed fracture: Secondary | ICD-10-CM | POA: Diagnosis not present

## 2019-11-19 DIAGNOSIS — I119 Hypertensive heart disease without heart failure: Secondary | ICD-10-CM | POA: Diagnosis not present

## 2019-11-19 DIAGNOSIS — E1159 Type 2 diabetes mellitus with other circulatory complications: Secondary | ICD-10-CM | POA: Diagnosis not present

## 2019-11-19 DIAGNOSIS — I25110 Atherosclerotic heart disease of native coronary artery with unstable angina pectoris: Secondary | ICD-10-CM | POA: Diagnosis not present

## 2019-11-19 DIAGNOSIS — I2510 Atherosclerotic heart disease of native coronary artery without angina pectoris: Secondary | ICD-10-CM | POA: Diagnosis not present

## 2019-11-19 DIAGNOSIS — Z7982 Long term (current) use of aspirin: Secondary | ICD-10-CM | POA: Diagnosis not present

## 2019-11-19 DIAGNOSIS — R0602 Shortness of breath: Secondary | ICD-10-CM | POA: Diagnosis not present

## 2019-11-19 DIAGNOSIS — Z7984 Long term (current) use of oral hypoglycemic drugs: Secondary | ICD-10-CM | POA: Insufficient documentation

## 2019-11-19 DIAGNOSIS — G8929 Other chronic pain: Secondary | ICD-10-CM | POA: Diagnosis present

## 2019-11-19 DIAGNOSIS — I4510 Unspecified right bundle-branch block: Secondary | ICD-10-CM | POA: Diagnosis not present

## 2019-11-19 DIAGNOSIS — E119 Type 2 diabetes mellitus without complications: Secondary | ICD-10-CM | POA: Insufficient documentation

## 2019-11-19 DIAGNOSIS — E785 Hyperlipidemia, unspecified: Secondary | ICD-10-CM | POA: Diagnosis present

## 2019-11-19 DIAGNOSIS — W1830XA Fall on same level, unspecified, initial encounter: Secondary | ICD-10-CM | POA: Insufficient documentation

## 2019-11-19 DIAGNOSIS — W19XXXA Unspecified fall, initial encounter: Secondary | ICD-10-CM | POA: Diagnosis present

## 2019-11-19 DIAGNOSIS — Z79899 Other long term (current) drug therapy: Secondary | ICD-10-CM | POA: Insufficient documentation

## 2019-11-19 DIAGNOSIS — Y9289 Other specified places as the place of occurrence of the external cause: Secondary | ICD-10-CM | POA: Insufficient documentation

## 2019-11-19 DIAGNOSIS — S301XXA Contusion of abdominal wall, initial encounter: Secondary | ICD-10-CM | POA: Insufficient documentation

## 2019-11-19 DIAGNOSIS — I700 Atherosclerosis of aorta: Secondary | ICD-10-CM | POA: Diagnosis not present

## 2019-11-19 DIAGNOSIS — I152 Hypertension secondary to endocrine disorders: Secondary | ICD-10-CM | POA: Diagnosis not present

## 2019-11-19 DIAGNOSIS — Q278 Other specified congenital malformations of peripheral vascular system: Secondary | ICD-10-CM | POA: Diagnosis not present

## 2019-11-19 DIAGNOSIS — M160 Bilateral primary osteoarthritis of hip: Secondary | ICD-10-CM | POA: Diagnosis not present

## 2019-11-19 DIAGNOSIS — S300XXA Contusion of lower back and pelvis, initial encounter: Secondary | ICD-10-CM | POA: Diagnosis not present

## 2019-11-19 DIAGNOSIS — S299XXA Unspecified injury of thorax, initial encounter: Secondary | ICD-10-CM | POA: Diagnosis present

## 2019-11-19 LAB — URINALYSIS, ROUTINE W REFLEX MICROSCOPIC
Bilirubin Urine: NEGATIVE
Glucose, UA: >=500 mg/dL — AB
Hgb urine dipstick: NEGATIVE
Ketones, ur: NEGATIVE mg/dL
Leukocytes,Ua: NEGATIVE
Nitrite: NEGATIVE
Protein, ur: NEGATIVE mg/dL
Specific Gravity, Urine: 1.01 (ref 1.005–1.030)
pH: 6.5 (ref 5.0–8.0)

## 2019-11-19 LAB — CBC WITH DIFFERENTIAL/PLATELET
Abs Immature Granulocytes: 0.03 10*3/uL (ref 0.00–0.07)
Basophils Absolute: 0 10*3/uL (ref 0.0–0.1)
Basophils Relative: 0 %
Eosinophils Absolute: 0.1 10*3/uL (ref 0.0–0.5)
Eosinophils Relative: 1 %
HCT: 44.1 % (ref 39.0–52.0)
Hemoglobin: 14.4 g/dL (ref 13.0–17.0)
Immature Granulocytes: 0 %
Lymphocytes Relative: 10 %
Lymphs Abs: 0.9 10*3/uL (ref 0.7–4.0)
MCH: 29.4 pg (ref 26.0–34.0)
MCHC: 32.7 g/dL (ref 30.0–36.0)
MCV: 90 fL (ref 80.0–100.0)
Monocytes Absolute: 0.7 10*3/uL (ref 0.1–1.0)
Monocytes Relative: 8 %
Neutro Abs: 7.5 10*3/uL (ref 1.7–7.7)
Neutrophils Relative %: 81 %
Platelets: 209 10*3/uL (ref 150–400)
RBC: 4.9 MIL/uL (ref 4.22–5.81)
RDW: 12.6 % (ref 11.5–15.5)
WBC: 9.3 10*3/uL (ref 4.0–10.5)
nRBC: 0 % (ref 0.0–0.2)

## 2019-11-19 LAB — COMPREHENSIVE METABOLIC PANEL
ALT: 28 U/L (ref 0–44)
AST: 24 U/L (ref 15–41)
Albumin: 4 g/dL (ref 3.5–5.0)
Alkaline Phosphatase: 66 U/L (ref 38–126)
Anion gap: 10 (ref 5–15)
BUN: 24 mg/dL — ABNORMAL HIGH (ref 8–23)
CO2: 26 mmol/L (ref 22–32)
Calcium: 8.9 mg/dL (ref 8.9–10.3)
Chloride: 95 mmol/L — ABNORMAL LOW (ref 98–111)
Creatinine, Ser: 0.86 mg/dL (ref 0.61–1.24)
GFR calc Af Amer: >60 mL/min (ref >60)
GFR calc non Af Amer: >60 mL/min (ref >60)
Glucose, Bld: 231 mg/dL — ABNORMAL HIGH (ref 70–99)
Potassium: 4.6 mmol/L (ref 3.5–5.1)
Sodium: 131 mmol/L — ABNORMAL LOW (ref 135–145)
Total Bilirubin: 0.5 mg/dL (ref 0.3–1.2)
Total Protein: 7.7 g/dL (ref 6.5–8.1)

## 2019-11-19 LAB — URINALYSIS, MICROSCOPIC (REFLEX)

## 2019-11-19 LAB — CBG MONITORING, ED: Glucose-Capillary: 170 mg/dL — ABNORMAL HIGH (ref 70–99)

## 2019-11-19 LAB — RESPIRATORY PANEL BY RT PCR (FLU A&B, COVID)
Influenza A by PCR: NEGATIVE
Influenza B by PCR: NEGATIVE
SARS Coronavirus 2 by RT PCR: NEGATIVE

## 2019-11-19 LAB — GLUCOSE, CAPILLARY: Glucose-Capillary: 219 mg/dL — ABNORMAL HIGH (ref 70–99)

## 2019-11-19 MED ORDER — ASPIRIN EC 81 MG PO TBEC
81.0000 mg | DELAYED_RELEASE_TABLET | Freq: Every day | ORAL | Status: DC
  Administered 2019-11-20: 81 mg via ORAL
  Filled 2019-11-19: qty 1

## 2019-11-19 MED ORDER — HYDROMORPHONE HCL 1 MG/ML IJ SOLN
0.5000 mg | INTRAMUSCULAR | Status: DC | PRN
  Administered 2019-11-19: 0.5 mg via INTRAVENOUS
  Filled 2019-11-19: qty 1

## 2019-11-19 MED ORDER — INSULIN ASPART 100 UNIT/ML ~~LOC~~ SOLN
0.0000 [IU] | Freq: Three times a day (TID) | SUBCUTANEOUS | Status: DC
  Administered 2019-11-20: 2 [IU] via SUBCUTANEOUS

## 2019-11-19 MED ORDER — TRAMADOL HCL 50 MG PO TABS
100.0000 mg | ORAL_TABLET | Freq: Three times a day (TID) | ORAL | Status: DC | PRN
  Administered 2019-11-20: 100 mg via ORAL
  Filled 2019-11-19: qty 2

## 2019-11-19 MED ORDER — SERTRALINE HCL 50 MG PO TABS
50.0000 mg | ORAL_TABLET | Freq: Two times a day (BID) | ORAL | Status: DC
  Administered 2019-11-19 – 2019-11-20 (×2): 50 mg via ORAL
  Filled 2019-11-19 (×2): qty 1

## 2019-11-19 MED ORDER — ACETAMINOPHEN 500 MG PO TABS
1000.0000 mg | ORAL_TABLET | Freq: Three times a day (TID) | ORAL | Status: DC
  Administered 2019-11-19 – 2019-11-20 (×2): 1000 mg via ORAL
  Filled 2019-11-19 (×2): qty 2

## 2019-11-19 MED ORDER — METHOCARBAMOL 500 MG PO TABS
500.0000 mg | ORAL_TABLET | Freq: Three times a day (TID) | ORAL | Status: DC
  Administered 2019-11-19 – 2019-11-20 (×2): 500 mg via ORAL
  Filled 2019-11-19 (×2): qty 1

## 2019-11-19 MED ORDER — IOHEXOL 300 MG/ML  SOLN
100.0000 mL | Freq: Once | INTRAMUSCULAR | Status: AC | PRN
  Administered 2019-11-19: 100 mL via INTRAVENOUS

## 2019-11-19 MED ORDER — FENTANYL CITRATE (PF) 100 MCG/2ML IJ SOLN
50.0000 ug | Freq: Once | INTRAMUSCULAR | Status: AC
  Administered 2019-11-19: 50 ug via INTRAVENOUS
  Filled 2019-11-19: qty 2

## 2019-11-19 MED ORDER — ACETAMINOPHEN 325 MG PO TABS: 650.0000 mg | ORAL_TABLET | Freq: Four times a day (QID) | ORAL | Status: DC | PRN

## 2019-11-19 MED ORDER — SENNOSIDES-DOCUSATE SODIUM 8.6-50 MG PO TABS: 1.0000 | ORAL_TABLET | Freq: Every evening | ORAL | Status: DC | PRN

## 2019-11-19 MED ORDER — MORPHINE SULFATE (PF) 2 MG/ML IV SOLN: 2.0000 mg | INTRAVENOUS | Status: DC | PRN

## 2019-11-19 MED ORDER — HYDROMORPHONE HCL 1 MG/ML IJ SOLN
1.0000 mg | Freq: Once | INTRAMUSCULAR | Status: AC
  Administered 2019-11-19: 1 mg via INTRAVENOUS
  Filled 2019-11-19: qty 1

## 2019-11-19 MED ORDER — VITAMIN B-6 100 MG PO TABS
100.0000 mg | ORAL_TABLET | Freq: Every day | ORAL | Status: DC
  Administered 2019-11-20: 100 mg via ORAL
  Filled 2019-11-19: qty 1

## 2019-11-19 MED ORDER — SODIUM CHLORIDE 0.9 % IV BOLUS
500.0000 mL | Freq: Once | INTRAVENOUS | Status: AC
  Administered 2019-11-19: 500 mL via INTRAVENOUS

## 2019-11-19 MED ORDER — CARVEDILOL 6.25 MG PO TABS
6.2500 mg | ORAL_TABLET | Freq: Two times a day (BID) | ORAL | Status: DC
  Administered 2019-11-19 – 2019-11-20 (×2): 6.25 mg via ORAL
  Filled 2019-11-19 (×2): qty 1

## 2019-11-19 MED ORDER — LISINOPRIL 40 MG PO TABS
40.0000 mg | ORAL_TABLET | Freq: Every day | ORAL | Status: DC
  Administered 2019-11-20: 40 mg via ORAL
  Filled 2019-11-19: qty 1

## 2019-11-19 MED ORDER — ONDANSETRON HCL 4 MG/2ML IJ SOLN
4.0000 mg | Freq: Once | INTRAMUSCULAR | Status: AC
  Administered 2019-11-19: 4 mg via INTRAVENOUS
  Filled 2019-11-19: qty 2

## 2019-11-19 MED ORDER — ZOLPIDEM TARTRATE 5 MG PO TABS
5.0000 mg | ORAL_TABLET | Freq: Every day | ORAL | Status: DC
  Administered 2019-11-19: 5 mg via ORAL
  Filled 2019-11-19: qty 1

## 2019-11-19 MED ORDER — INSULIN GLARGINE 100 UNIT/ML ~~LOC~~ SOLN
32.0000 [IU] | Freq: Every day | SUBCUTANEOUS | Status: DC
  Administered 2019-11-20: 32 [IU] via SUBCUTANEOUS
  Filled 2019-11-19: qty 0.32

## 2019-11-19 MED ORDER — ACETAMINOPHEN 650 MG RE SUPP: 650.0000 mg | Freq: Four times a day (QID) | RECTAL | Status: DC | PRN

## 2019-11-19 MED ORDER — PRASUGREL HCL 10 MG PO TABS
10.0000 mg | ORAL_TABLET | Freq: Every day | ORAL | Status: DC
  Administered 2019-11-20: 10 mg via ORAL
  Filled 2019-11-19: qty 1

## 2019-11-19 MED ORDER — ENOXAPARIN SODIUM 40 MG/0.4ML ~~LOC~~ SOLN: 40.0000 mg | SUBCUTANEOUS | Status: DC

## 2019-11-19 MED ORDER — FAMOTIDINE 20 MG PO TABS
20.0000 mg | ORAL_TABLET | Freq: Two times a day (BID) | ORAL | Status: DC
  Administered 2019-11-19 – 2019-11-20 (×2): 20 mg via ORAL
  Filled 2019-11-19 (×2): qty 1

## 2019-11-19 MED ORDER — PREGABALIN 75 MG PO CAPS
150.0000 mg | ORAL_CAPSULE | Freq: Two times a day (BID) | ORAL | Status: DC
  Administered 2019-11-19 – 2019-11-20 (×2): 150 mg via ORAL
  Filled 2019-11-19 (×2): qty 2

## 2019-11-19 NOTE — Progress Notes (Signed)
Patient arrived from [facility]. Walked to bed with standby assist. Pain currently c/o 6/10 back pain. Vitals obtained, admitted to tele. Explained how to use call bell and to call for assistance. Patient in low bed. Admitting MD paged and made aware of arrival.

## 2019-11-19 NOTE — Consult Note (Signed)
Referring Provider: Shelby Dubin  Primary Survey: airway intact, breath sounds present bilateral, pulses intact  Spencer [name] is an 64 y.o. male.  HPI: 64 [facility] and fell against a bench. He then rolled off the bench onto the floor. He did not hit his head. He did not lose consciousness or have dizziness. He complains of pain on his right flank. Pain is mild and constant. It is worse with certain movements. Pain medications have helped. He is able to breathe fine. He takes ASA and effient for cardiac stents.  Past Medical History:  Diagnosis Date  . Anxiety attack   . Arthritis    "back; shoulders; knees" (07/23/2013)  . Back pain, chronic    "all over" (07/23/2013)  . Benign neoplasm of colon 04/01/2000   Hyperplastic  . Coronary artery disease    a. NSTEMI 10/12: s/p CABG with L-LAD, S-OM1/OM2, S-RV marg/RCA;  b. 07/2013 Cath/PCI: LM <10, LAD 70-80p, 47m, D1 90, RI 30ost, LCX 40-50p, 78m, OM1 90, OM2 100, RCA 100p, VG->OM1->OM2 patent with 90 @ anast (2.5x12 Xience Alpine DES), RCA 100p, LIMA->LAD nl, VG->Diag nl, VG->RV marginal/RCA 90p (3.0x23 Xience Alpine DES);  c. 07/2013 Echo: EF 55-60%.  . Depression    "pain related"  . DM2 (diabetes mellitus, type 2) ([place])   . GERD (gastroesophageal reflux disease)   . Hyperlipidemia   . Hypertension   . IBS (irritable bowel syndrome)   . Knee pain, bilateral    a. since 01/2013.  . Morbidly obese ([place])   . Myocardial infarction ([place]) 2012  . Obesity   . Pleural effusion, left 11/2010   Postop; resolved with p.o. diuresis    Past Surgical History:  Procedure Laterality Date  . ABDOMINAL AORTOGRAM W/LOWER EXTREMITY N/A 07/30/2017   Procedure: ABDOMINAL AORTOGRAM W/LOWER EXTREMITY;  Surgeon: Wellington Hampshire, MD;  Location: [facility];  Service: Cardiovascular;  Laterality: N/A;  . BACK SURGERY    . CARDIAC CATHETERIZATION  11/2010  . CARPAL TUNNEL RELEASE Bilateral   .  CORONARY ANGIOPLASTY WITH STENT PLACEMENT  07/23/2013   "2"  . CORONARY ARTERY BYPASS GRAFT  11/26/10   x 6 SURGEON DR. PETER VAN TRIGT  . HERNIA REPAIR     UHR  . LEFT HEART CATHETERIZATION WITH CORONARY/GRAFT ANGIOGRAM N/A 07/23/2013   Procedure: LEFT HEART CATHETERIZATION WITH Beatrix Fetters;  Surgeon: Peter M Martinique, MD;  Location: [facility] CATH [facility];  Service: Cardiovascular;  Laterality: N/A;  . LUMBAR FUSION     MULTIPLE; DR. Ellene Route  . SHOULDER ARTHROSCOPY W/ ROTATOR CUFF REPAIR Bilateral   . UMBILICAL HERNIA REPAIR      Family History  Problem Relation Age of Onset  . Hypertension Neg Hx   . Heart disease Neg Hx   . Peripheral vascular disease Neg Hx   . Diabetes Neg Hx     Social History:  reports that he has never smoked. He has never used smokeless tobacco. He reports that he does not drink alcohol and does not use drugs.  Allergies:  Allergies  Allergen Reactions  . Victoza [Liraglutide] Other (See Comments)    pancreatitis  . Brilinta [Ticagrelor]     Dyspnea  . Codeine Other (See Comments)    hyperactive  . Other Nausea And Vomiting    Mayonnaise causes nausea and vomiting  . Statins     Muscle weakness & fatigue    Medications: I have reviewed the  patient's current medications.  Results for orders placed or performed during the hospital encounter of 11/19/19 (from the past 48 hour(s))  Urinalysis, Routine w reflex microscopic Urine, Unspecified Source     Status: Abnormal   Collection Time: 11/19/19 11:07 AM  Result Value Ref Range   Color, Urine YELLOW YELLOW   APPearance CLEAR CLEAR   Specific Gravity, Urine 1.010 1.005 - 1.030   pH 6.5 5.0 - 8.0   Glucose, UA >=500 (A) NEGATIVE mg/dL   Hgb urine dipstick NEGATIVE NEGATIVE   Bilirubin Urine NEGATIVE NEGATIVE   Ketones, ur NEGATIVE NEGATIVE mg/dL   Protein, ur NEGATIVE NEGATIVE mg/dL   Nitrite NEGATIVE NEGATIVE   Leukocytes,Ua NEGATIVE NEGATIVE    Comment: Performed at [facility], [address] [place], [place] [address]  Urinalysis, Microscopic (reflex)     Status: Abnormal   Collection Time: 11/19/19 11:07 AM  Result Value Ref Range   RBC / HPF 0-5 0 - 5 RBC/hpf   WBC, UA 0-5 0 - 5 WBC/hpf   Bacteria, UA RARE (A) NONE SEEN   Squamous Epithelial / LPF 0-5 0 - 5    Comment: Performed at [facility], [address] [place], [place] [address]  CBC with Differential     Status: None   Collection Time: 11/19/19 11:24 AM  Result Value Ref Range   WBC 9.3 4.0 - 10.5 K/uL   RBC 4.90 4.22 - 5.81 MIL/uL   Hemoglobin 14.4 13.0 - 17.0 g/dL   HCT 44.1 39 - 52 %   MCV 90.0 80.0 - 100.0 fL   MCH 29.4 26.0 - 34.0 pg   MCHC 32.7 30.0 - 36.0 g/dL   RDW 12.6 11.5 - 15.5 %   Platelets 209 150 - 400 K/uL   nRBC 0.0 0.0 - 0.2 %   Neutrophils Relative % 81 %   Neutro Abs 7.5 1.7 - 7.7 K/uL   Lymphocytes Relative 10 %   Lymphs Abs 0.9 0.7 - 4.0 K/uL   Monocytes Relative 8 %   Monocytes Absolute 0.7 0 - 1 K/uL   Eosinophils Relative 1 %   Eosinophils Absolute 0.1 0 - 0 K/uL   Basophils Relative 0 %   Basophils Absolute 0.0 0 - 0 K/uL   Immature Granulocytes 0 %   Abs Immature Granulocytes 0.03 0.00 - 0.07 K/uL    Comment: Performed at [facility], [address] [place], [place] [address]  Comprehensive metabolic panel     Status: Abnormal   Collection Time: 11/19/19 11:24 AM  Result Value Ref Range   Sodium 131 (L) 135 - 145 mmol/L   Potassium 4.6 3.5 - 5.1 mmol/L   Chloride 95 (L) 98 - 111 mmol/L   CO2 26 22 - 32 mmol/L   Glucose, Bld 231 (H) 70 - 99 mg/dL    Comment: Glucose reference range applies only to samples taken after fasting for at least 8 hours.   BUN 24 (H) 8 - 23 mg/dL   Creatinine, Ser 0.86 0.61 - 1.24 mg/dL   Calcium 8.9 8.9 - 10.3 mg/dL   Total Protein 7.7 6.5 - 8.1 g/dL   Albumin 4.0 3.5 - 5.0 g/dL   AST 24 15 - 41 U/L   ALT 28 0 - 44 U/L   Alkaline Phosphatase 66 38 - 126 U/L   Total Bilirubin 0.5 0.3 - 1.2 mg/dL   GFR  calc non Af Amer >60 >60  mL/min   GFR calc Af Amer >60 >60 mL/min   Anion gap 10 5 - 15    Comment: Performed at [facility], [address] [place], [place] [address]  CBG monitoring, ED     Status: Abnormal   Collection Time: 11/19/19  2:20 PM  Result Value Ref Range   Glucose-Capillary 170 (H) 70 - 99 mg/dL    Comment: Glucose reference range applies only to samples taken after fasting for at least 8 hours.  Respiratory Panel by RT PCR (Flu A&B, Covid) - Nasopharyngeal Swab     Status: None   Collection Time: 11/19/19  4:05 PM   Specimen: Nasopharyngeal Swab  Result Value Ref Range   SARS Coronavirus 2 by RT PCR NEGATIVE NEGATIVE    Comment: (NOTE) SARS-CoV-2 target nucleic acids are NOT DETECTED.  The SARS-CoV-2 RNA is generally detectable in upper respiratoy specimens during the acute phase of infection. The lowest concentration of SARS-CoV-2 viral copies this assay can detect is 131 copies/mL. A negative result does not preclude SARS-Cov-2 infection and should not be used as the sole basis for treatment or other patient management decisions. A negative result may occur with  improper specimen collection/handling, submission of specimen other than nasopharyngeal swab, presence of viral mutation(s) within the areas targeted by this assay, and inadequate number of viral copies (<131 copies/mL). A negative result must be combined with clinical observations, patient history, and epidemiological information. The expected result is Negative.  Fact Sheet for Patients:  PinkCheek.be  Fact Sheet for Healthcare Providers:  GravelBags.it  This test is no t yet approved or cleared by the [place] FDA and  has been authorized for detection and/or diagnosis of SARS-CoV-2 by FDA under an Emergency Use Authorization (EUA). This EUA will remain  in effect (meaning this test can be used) for the duration of  the COVID-19 declaration under Section 564(b)(1) of the Act, 21 U.S.C. section 360bbb-3(b)(1), unless the authorization is terminated or revoked sooner.     Influenza A by PCR NEGATIVE NEGATIVE   Influenza B by PCR NEGATIVE NEGATIVE    Comment: (NOTE) The Xpert Xpress SARS-CoV-2/FLU/RSV assay is intended as an aid in  the diagnosis of influenza from Nasopharyngeal swab specimens and  should not be used as a sole basis for treatment. Nasal washings and  aspirates are unacceptable for Xpert Xpress SARS-CoV-2/FLU/RSV  testing.  Fact Sheet for Patients: PinkCheek.be  Fact Sheet for Healthcare Providers: GravelBags.it  This test is not yet approved or cleared by the [place] FDA and  has been authorized for detection and/or diagnosis of SARS-CoV-2 by  FDA under an Emergency Use Authorization (EUA). This EUA will remain  in effect (meaning this test can be used) for the duration of the  Covid-19 declaration under Section 564(b)(1) of the Act, 21  U.S.C. section 360bbb-3(b)(1), unless the authorization is  terminated or revoked. Performed at [facility], [address], [place], [place] [address]     CT CHEST ABDOMEN PELVIS W CONTRAST  Result Date: 11/19/2019 CLINICAL DATA:  [place] today.  Trauma. EXAM: CT CHEST, ABDOMEN, AND PELVIS WITH CONTRAST TECHNIQUE: Multidetector CT imaging of the chest, abdomen and pelvis was performed following the standard protocol during bolus administration of intravenous contrast. CONTRAST:  154mL OMNIPAQUE IOHEXOL 300 MG/ML  SOLN COMPARISON:  CT scan 08/12/2015 FINDINGS: CT CHEST FINDINGS Cardiovascular: The heart is normal in size. No pericardial effusion. 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  Age advanced three-vessel coronary artery calcifications. Scattered age advanced aortic calcifications. The aorta is normal in caliber. No focal aneurysm or dissection.  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 Mediastinum/Nodes: No mediastinal or hilar mass or adenopathy or hematoma. The esophagus is unremarkable. Lungs/Pleura: No acute pulmonary findings. No pulmonary contusion, infiltrate, mass, pleural effusion or pneumothorax. Musculoskeletal: There are fractures of the right tenth, eleventh and twelfth ribs. The eleventh rib fracture is minimally displaced. No underlying injury to the liver is identified. There is an overlying contusion/hematoma in the right upper flank area with small hematoma. CT ABDOMEN PELVIS FINDINGS Hepatobiliary: No hepatic lesions or acute hepatic injury. No perihepatic fluid collections. The gallbladder is unremarkable. No common bile duct dilatation. Pancreas: No mass, inflammation or ductal dilatation. No evidence of acute pancreatic injury or peripancreatic fluid collection. Spleen: Normal size. No acute injury or perisplenic fluid collection. Adrenals/Urinary Tract: The adrenal glands are normal. No acute renal injury or renal mass. No hydronephrosis. The bladder is unremarkable. Stomach/Bowel: The stomach, duodenum, small bowel and colon are grossly normal without oral contrast. No acute inflammatory changes, mass lesions or obstructive findings. Vascular/Lymphatic: Age advanced atherosclerotic calcifications involving the aorta and branch vessels. No aneurysm or dissection. No mesenteric or retroperitoneal mass, adenopathy or hematoma. Reproductive: The prostate gland and seminal vesicles are unremarkable. Other: No pelvic mass or adenopathy. No free pelvic fluid collections. No inguinal mass or adenopathy. No abdominal wall hernia or subcutaneous lesions. Musculoskeletal: Extensive lumbosacral fusion hardware. No obvious complicating features. I do not see any significant solid interbody fusion changes at L5-S1. The bony pelvis is intact. The pubic symphysis and SI joints are intact.  Moderate bilateral SI joint degenerative changes are noted along with mild to moderate bilateral hip joint degenerative changes. No acute pelvic fracture. IMPRESSION: 1. Right posterolateral tenth, eleventh and twelfth rib fractures. Overlying right upper flank contusion/hematoma. No pneumothorax or evidence of underlying liver injury. 2. No acute pulmonary findings and normal appearance of the heart and great vessels other than atherosclerotic disease. 3. No acute intra-abdominal/intrapelvic injury is identified. 4. Age advanced atherosclerotic calcifications involving the thoracic and abdominal aorta and branch vessels including the coronary arteries. Aortic Atherosclerosis (ICD10-I70.0). Electronically Signed   By: Marijo Sanes M.D.   On: 11/19/2019 13:07    Review of Systems  Constitutional: Negative for chills and fever.  HENT: Negative for hearing loss.   Eyes: Negative for blurred vision and double vision.  Respiratory: Negative for cough and hemoptysis.   Cardiovascular: Negative for chest pain and palpitations.  Gastrointestinal: Negative for abdominal pain, nausea and vomiting.  Genitourinary: Negative for dysuria and urgency.  Musculoskeletal: Positive for back pain. Negative for myalgias and neck pain.  Skin: Negative for itching and rash.  Neurological: Negative for dizziness, tingling and headaches.  Endo/Heme/Allergies: Does not bruise/bleed easily.  Psychiatric/Behavioral: Negative for depression and suicidal ideas.    PE Blood pressure (!) 168/68, pulse 70, temperature 98.1 F (36.7 C), temperature source Oral, resp. rate 18, height 6\' 1"  (1.854 m), weight 130 kg, SpO2 98 %. Constitutional: NAD; conversant; no deformities Eyes: Moist conjunctiva; no lid lag; anicteric; PERRL Neck: Trachea midline; no thyromegaly, nontender Lungs: Normal respiratory effort; no tactile fremitus CV: RRR; no palpable thrills; no pitting edema, ecchymosis in a line over right flank GI: Abd  nontender; no palpable hepatosplenomegaly MSK: normal gait; no clubbing/cyanosis Psychiatric: Appropriate affect; alert and oriented x3 Lymphatic: No palpable cervical or axillary lymphadenopathy  Assessment/Plan: 64 yo male with 3 rib fractures after fall. He is breathing well on room air. -pain control -IS -glucose control per medicine -if pain continues to be controlled likely home in the morning  Procedures: none  Arta Bruce Yailine Ballard 11/19/2019, 8:28 PM

## 2019-11-19 NOTE — ED Notes (Signed)
[facility] RT fed pt.diet sprite and pack of nabs.

## 2019-11-19 NOTE — ED Notes (Signed)
Called Pt. Wife Jackelyn Poling at [phone]

## 2019-11-19 NOTE — Progress Notes (Signed)
Assessment and plans per GI PA-C noted

## 2019-11-19 NOTE — ED Notes (Signed)
Pt. Reports having drop foot in the R foot and falls sometimes.

## 2019-11-19 NOTE — ED Notes (Signed)
Called to give report to RN Lauren at [facility] asked RN Rosana Hoes to call back to give report in 10 min. Due to she is busy with something.

## 2019-11-19 NOTE — ED Notes (Signed)
CT waiting for labs and IV

## 2019-11-19 NOTE — ED Notes (Signed)
Pt. Reports feeling better with the pain med given last time.

## 2019-11-19 NOTE — ED Provider Notes (Signed)
[place] EMERGENCY DEPARTMENT Provider Note   CSN: [id number] Arrival date & time: 11/19/19  8144     History Chief Complaint  Patient presents with  . Shortness of Breath  . Fall    Spencer [name] is a 64 y.o. male with past medical history significant for CAD, depression, chronic back pain, hypertension, morbid obesity who presents for evaluation after fall.  Patient states he goes daily to the Gyn to walk in the pool for exercise.  Patient states he was standing and going to put his bathing suit on when he stood on his foot that he has chronic paresthesias with foot drop and subsequently lost his balance falling into a metal bench.  Patient states he has posterior right chest wall pain, right flank pain since the incident.  Denies hitting his head, LOC.  States he is on anticoagulation, Effient and ASA for his history of CAD.  Pain worse with movement, deep breathing.  Patient denies any headache, lightheadedness, dizziness, chest pain, shortness of breath, paresthesias or weakness prior to the fall or after the incident. Initially he was able to walk however with significant pain to his posterior chest and abdomen and now unable to walk 2/2 flank pain.  His tetanus is up-to-date.  Patient has large abrasion to posterior chest and abdomen well.  No left-sided chest pain, diaphoresis, nausea, vomiting, radiation to back, left jaw.  No anterior abdominal pain, dysuria, new paresthesias, new weakness, midline back pain.  States he took tramadol and Robaxin which he is on chronically.  Denies additional aggravating or alleviating factors. Rates his pain a 8/10.  History obtained from patient and past medical records. No interpretor was used.   HPI     Past Medical History:  Diagnosis Date  . Anxiety attack   . Arthritis    "back; shoulders; knees" (07/23/2013)  . Back pain, chronic    "all over" (07/23/2013)  . Benign neoplasm of colon 04/01/2000   Hyperplastic  . Coronary  artery disease    a. NSTEMI 10/12: s/p CABG with L-LAD, S-OM1/OM2, S-RV marg/RCA;  b. 07/2013 Cath/PCI: LM <10, LAD 70-80p, 52m D1 90, RI 30ost, LCX 40-50p, 919mOM1 90, OM2 100, RCA 100p, VG->OM1->OM2 patent with 90 @ anast (2.5x12 Xience Alpine DES), RCA 100p, LIMA->LAD nl, VG->Diag nl, VG->RV marginal/RCA 90p (3.0x23 Xience Alpine DES);  c. 07/2013 Echo: EF 55-60%.  . Depression    "pain related"  . DM2 (diabetes mellitus, type 2) ([place]  . GERD (gastroesophageal reflux disease)   . Hyperlipidemia   . Hypertension   . IBS (irritable bowel syndrome)   . Knee pain, bilateral    a. since 01/2013.  . Morbidly obese ([facility]  . Myocardial infarction ([facility]  . Obesity   . Pleural effusion, left 11/2010   Postop; resolved with p.o. diuresis    Patient Active Problem List   Diagnosis Date Noted  . Fall 11/19/2019  . Unstable angina ([facility]06/06/2013  . [facility]  . Crescendo angina ([facility]06/05/2013  . Right carotid bruit 05/26/2012  . Morbid obesity ([facility]03/12/2012  . Hx of colonic polyps 04/28/2012  . Shortness of breath 12/11/2010  . Coronary artery disease   . Hypertension   . Hyperlipidemia   . Type II or unspecified type diabetes mellitus without mention of complication, uncontrolled   . CONTACT DERMATITIS 04/17/2008    Past Surgical History:  Procedure Laterality Date  . ABDOMINAL AORTOGRAM W/LOWER EXTREMITY N/A  07/30/2017   Procedure: ABDOMINAL AORTOGRAM W/LOWER EXTREMITY;  Surgeon: Wellington Hampshire, MD;  Location: [place] [facility];  Service: Cardiovascular;  Laterality: N/A;  . BACK SURGERY    . CARDIAC CATHETERIZATION  11/2010  . CARPAL TUNNEL RELEASE Bilateral   . CORONARY ANGIOPLASTY WITH STENT PLACEMENT  07/23/2013   "2"  . CORONARY ARTERY BYPASS GRAFT  11/26/10   x 6 SURGEON DR. PETER VAN TRIGT  . HERNIA REPAIR     UHR  . LEFT HEART CATHETERIZATION WITH CORONARY/GRAFT ANGIOGRAM N/A 07/23/2013   Procedure: LEFT HEART CATHETERIZATION WITH  Beatrix Fetters;  Surgeon: Peter M Martinique, MD;  Location: [facility] CATH LAB;  Service: Cardiovascular;  Laterality: N/A;  . LUMBAR FUSION     MULTIPLE; DR. Ellene Route  . SHOULDER ARTHROSCOPY W/ ROTATOR CUFF REPAIR Bilateral   . UMBILICAL HERNIA REPAIR         Family History  Problem Relation Age of Onset  . Hypertension Neg Hx   . Heart disease Neg Hx   . Peripheral vascular disease Neg Hx   . Diabetes Neg Hx     Social History   Tobacco Use  . Smoking status: Never Smoker  . Smokeless tobacco: Never Used  Vaping Use  . Vaping Use: Never used  Substance Use Topics  . Alcohol use: No  . Drug use: No    Home Medications Prior to Admission medications   Medication Sig Start Date End Date Taking? Authorizing Provider  Ascorbic Acid (VITAMIN C) 1000 MG tablet Take 1,000 mg by mouth daily.   Yes [provider]  aspirin EC 81 MG tablet Take 81 mg by mouth daily.    Yes [provider]  carvedilol (COREG) 6.25 MG tablet TAKE 1 TABLET BY MOUTH TWICE DAILY Patient taking differently: Take 6.25 mg by mouth in the morning and at bedtime.  10/22/19  Yes Wellington Hampshire, MD  cholecalciferol (VITAMIN D) 1000 UNITS tablet Take 1,000 Units by mouth daily.   Yes [provider]  docusate sodium (COLACE) 100 MG capsule Take 100 mg by mouth 2 (two) times daily.   Yes [provider]  famotidine (PEPCID) 20 MG tablet Take 20 mg by mouth 2 (two) times daily. 06/14/19  Yes [provider]  HUMALOG KWIKPEN 100 UNIT/ML KwikPen Inject 4 Units into the skin See admin instructions. With meals as directed 06/15/19  Yes [provider]  Icosapent Ethyl (VASCEPA) 1 g CAPS Take 2 capsules (2 g total) by mouth 2 (two) times daily. 09/16/17  Yes Wellington Hampshire, MD  Insulin Glargine (BASAGLAR KWIKPEN) 100 UNIT/ML Inject 32 Units into the skin daily.   Yes [provider]  JARDIANCE 25 MG TABS tablet Take 25 mg by mouth daily.  07/08/17  Yes  [provider]  lisinopril (PRINIVIL,ZESTRIL) 40 MG tablet Take 1 tablet (40 mg total) by mouth daily. 07/24/13  Yes Theora Gianotti, NP  metFORMIN (GLUCOPHAGE-XR) 500 MG 24 hr tablet Take 500 mg by mouth 2 (two) times daily. 0530 & 1730 05/23/17  Yes [provider]  methocarbamol (ROBAXIN) 500 MG tablet Take 500 mg by mouth every 8 (eight) hours. scheduled   Yes [provider]  metroNIDAZOLE (METROGEL) 1 % gel Apply 1 application topically daily as needed (rosacea).   Yes [provider]  nitroGLYCERIN (NITROSTAT) 0.4 MG SL tablet Place 1 tablet (0.4 mg total) under the tongue every 5 (five) minutes as needed for chest pain (CP or SOB). 07/24/13  Yes Theora Gianotti, NP  polyethylene glycol powder (MIRALAX) powder Take 17 g by mouth daily as needed (constipation).    Yes [provider]  prasugrel (EFFIENT) 10 MG TABS tablet Take 1 tablet by mouth daily. 10/21/17  Yes [provider]  pregabalin (LYRICA) 150 MG capsule Take 150 mg by mouth 2 (two) times daily. 0530 & 1730   Yes [provider]  Probiotic Product (PROBIOTIC PO) Take 1 each by mouth daily. Keifer liquid yogurt probiotic   Yes [provider]  pyridOXINE (VITAMIN B-6) 100 MG tablet Take 100 mg by mouth daily.    Yes [provider]  REPATHA SURECLICK 765 MG/ML SOAJ INJECT 140 MG INTO THE SKIN EVERY 14 DAYS. Patient taking differently: Inject 140 mg into the skin every 14 (fourteen) days.  01/19/19  Yes Wellington Hampshire, MD  sertraline (ZOLOFT) 50 MG tablet Take 50 mg by mouth 2 (two) times daily. 0530 & 1730   Yes [provider]  traMADol (ULTRAM) 50 MG tablet Take 100 mg by mouth in the morning, at noon, and at bedtime. scheduled   Yes [provider]  zolpidem (AMBIEN) 10 MG tablet Take 5 mg by mouth at bedtime.    Yes [provider]  FREESTYLE LITE test strip  05/05/19   [provider]  PENTIPS 31G X  5 MM MISC  05/05/19   [provider]  Sodium Sulfate-Mag Sulfate-KCl (SUTAB) [phone] MG TABS Take 1 kit by mouth as directed. 11/18/19   Esterwood, Amy S, PA-C    Allergies    Victoza [liraglutide], Brilinta [ticagrelor], Codeine, Other, and Statins  Review of Systems   Review of Systems  Constitutional: Negative.   HENT: Negative.   Cardiovascular: Positive for chest pain (Posterior right chest wall).  Gastrointestinal: Positive for abdominal pain (Posterior right flank). Negative for abdominal distention, anal bleeding, blood in stool, constipation, diarrhea, nausea, rectal pain and vomiting.  Genitourinary: Positive for flank pain. Negative for decreased urine volume, difficulty urinating, enuresis, frequency, genital sores, hematuria and urgency.  Musculoskeletal: Positive for back pain (Chronic) and gait problem (Chronic). Negative for neck pain and neck stiffness.  Skin: Positive for wound.  All other systems reviewed and are negative.   Physical Exam Updated Vital Signs BP (!) 147/72   Pulse (!) 59   Temp 98.3 F (36.8 C) (Oral)   Resp 13   Ht _0  (1.854 m)   Wt 129.3 kg   SpO2 99%   BMI 37.60 kg/m   Physical Exam Vitals and nursing note reviewed.  Constitutional:      General: He is not in acute distress.    Appearance: He is well-developed. He is not ill-appearing, toxic-appearing or diaphoretic.  HENT:     Head: Normocephalic and atraumatic.     Mouth/Throat:     Mouth: Mucous membranes are moist.  Eyes:     Pupils: Pupils are equal, round, and reactive to light.  Cardiovascular:     Rate and Rhythm: Normal rate and regular rhythm.  Pulmonary:     Effort: Pulmonary effort is normal. No respiratory distress.     Breath sounds: Normal breath sounds.  Chest:     Comments: Old midline chest wall scar from prior CABG.  No crepitus, step-offs to anterior chest wall.  Tenderness with abrasion to right posterior lower ribs no crepitus or step-offs.   Equal rise and fall of chest.  No flail chest Abdominal:     General: Bowel  sounds are normal. There is no distension.     Palpations: Abdomen is soft.     Tenderness: There is abdominal tenderness. There is right CVA tenderness. There is no guarding or rebound.     Hernia: No hernia is present.     Comments: Soft, nontender.  Old ecchymosis to right pelvis patient states from a prior injection, tenderness to right flank with overlying deep abrasion.  Some ecchymosis to right superior flank  Musculoskeletal:        General: Normal range of motion.     Cervical back: Normal, normal range of motion and neck supple.     Thoracic back: Normal.     Lumbar back: Normal.       Back:     Comments: Old lumbar surgical scars.  Deep abrasion to right posterior chest wall, superior flank with tenderness however no crepitus or step-offs.  Moves all 4 extremities without difficulty.  No bony tenderness to bilateral upper or lower extremities.  Skin:    General: Skin is warm and dry.     Capillary Refill: Capillary refill takes less than 2 seconds.     Comments: Deep abrasion approximately 17 inches long to posterior chest wall, superior flank.  Scant bleeding.  Neurological:     Mental Status: He is alert.     Comments: Cranial nerves II through XII grossly intact Right foot drop chronic for patient  Intact sensation    ED Results / Procedures / Treatments   Labs (all labs ordered are listed, but only abnormal results are displayed) Labs Reviewed  COMPREHENSIVE METABOLIC PANEL - Abnormal; Notable for the following components:      Result Value   Sodium 131 (*)    Chloride 95 (*)    Glucose, Bld 231 (*)    BUN 24 (*)    All other components within normal limits  URINALYSIS, ROUTINE W REFLEX MICROSCOPIC - Abnormal; Notable for the following components:   Glucose, UA >=500 (*)    All other components within normal limits  URINALYSIS, MICROSCOPIC (REFLEX) - Abnormal; Notable for the following  components:   Bacteria, UA RARE (*)    All other components within normal limits  CBG MONITORING, ED - Abnormal; Notable for the following components:   Glucose-Capillary 170 (*)    All other components within normal limits  RESPIRATORY PANEL BY RT PCR (FLU A&B, COVID)  CBC WITH DIFFERENTIAL/PLATELET    EKG EKG Interpretation  Date/Time:  Friday November 19 2019 11:37:05 EDT Ventricular Rate:  66 PR Interval:    QRS Duration: 136 QT Interval:  431 QTC Calculation: 452 R Axis:   62 Text Interpretation: Sinus rhythm Right bundle branch block Probable anteroseptal infarct, old since last tracing no significant change Confirmed by Malvin Johns [phone]) on 11/19/2019 12:29:13 PM   Radiology CT CHEST ABDOMEN PELVIS W CONTRAST  Result Date: 11/19/2019 CLINICAL DATA:  [place] today.  Trauma. EXAM: CT CHEST, ABDOMEN, AND PELVIS WITH CONTRAST TECHNIQUE: Multidetector CT imaging of the chest, abdomen and pelvis was performed following the standard protocol during bolus administration of intravenous contrast. CONTRAST:  134m OMNIPAQUE IOHEXOL 300 MG/ML  SOLN COMPARISON:  CT scan 08/12/2015 FINDINGS: CT CHEST FINDINGS Cardiovascular: The heart is normal in size. No pericardial effusion. 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 Age advanced three-vessel coronary artery calcifications. Scattered age advanced aortic calcifications. The aorta is normal in caliber. No focal aneurysm or dissection. Aberrant right subclavian artery noted with the artery coursing between  the spine and the esophagus. Mediastinum/Nodes: No mediastinal or hilar mass or adenopathy or hematoma. The esophagus is unremarkable. Lungs/Pleura: No acute pulmonary findings. No pulmonary contusion, infiltrate, mass, pleural effusion or pneumothorax. Musculoskeletal: There are fractures of the right tenth, eleventh and twelfth ribs. The eleventh rib fracture is minimally displaced. No underlying injury to the liver is  identified. There is an overlying contusion/hematoma in the right upper flank area with small hematoma. CT ABDOMEN PELVIS FINDINGS Hepatobiliary: No hepatic lesions or acute hepatic injury. No perihepatic fluid collections. The gallbladder is unremarkable. No common bile duct dilatation. Pancreas: No mass, inflammation or ductal dilatation. No evidence of acute pancreatic injury or peripancreatic fluid collection. Spleen: Normal size. No acute injury or perisplenic fluid collection. Adrenals/Urinary Tract: The adrenal glands are normal. No acute renal injury or renal mass. No hydronephrosis. The bladder is unremarkable. Stomach/Bowel: The stomach, duodenum, small bowel and colon are grossly normal without oral contrast. No acute inflammatory changes, mass lesions or obstructive findings. Vascular/Lymphatic: Age advanced atherosclerotic calcifications involving the aorta and branch vessels. No aneurysm or dissection. No mesenteric or retroperitoneal mass, adenopathy or hematoma. Reproductive: The prostate gland and seminal vesicles are unremarkable. Other: No pelvic mass or adenopathy. No free pelvic fluid collections. No inguinal mass or adenopathy. No abdominal wall hernia or subcutaneous lesions. Musculoskeletal: Extensive lumbosacral fusion hardware. No obvious complicating features. I do not see any significant solid interbody fusion changes at L5-S1. The bony pelvis is intact. The pubic symphysis and SI joints are intact. Moderate bilateral SI joint degenerative changes are noted along with mild to moderate bilateral hip joint degenerative changes. No acute pelvic fracture. IMPRESSION: 1. Right posterolateral tenth, eleventh and twelfth rib fractures. Overlying right upper flank contusion/hematoma. No pneumothorax or evidence of underlying liver injury. 2. No acute pulmonary findings and normal appearance of the heart and great vessels other than atherosclerotic disease. 3. No acute intra-abdominal/intrapelvic  injury is identified. 4. Age advanced atherosclerotic calcifications involving the thoracic and abdominal aorta and branch vessels including the coronary arteries. Aortic Atherosclerosis (ICD10-I70.0). Electronically Signed   By: Marijo Sanes M.D.   On: 11/19/2019 13:07    Procedures Procedures (including critical care time)  Medications Ordered in ED Medications  HYDROmorphone (DILAUDID) injection 0.5 mg (has no administration in time range)  sodium chloride 0.9 % bolus 500 mL (0 mLs Intravenous Stopped 11/19/19 1230)  fentaNYL (SUBLIMAZE) injection 50 mcg (50 mcg Intravenous Given 11/19/19 1123)  ondansetron (ZOFRAN) injection 4 mg (4 mg Intravenous Given 11/19/19 1122)  fentaNYL (SUBLIMAZE) injection 50 mcg (50 mcg Intravenous Given 11/19/19 1232)  iohexol (OMNIPAQUE) 300 MG/ML solution 100 mL (100 mLs Intravenous Contrast Given 11/19/19 1234)  HYDROmorphone (DILAUDID) injection 1 mg (1 mg Intravenous Given 11/19/19 1415)    ED Course  I have reviewed the triage vital signs and the nursing notes.  Pertinent labs & imaging results that were available during my care of the patient were reviewed by me and considered in my medical decision making (see chart for details).  64 year old male presents for evaluation of mechanical fall which occurred earlier today.  He denies hitting head, LOC.  He is on Effient for anticoagulation for prior CAD.  He does have some posterior right-sided chest wall pain posterior flank pain with approximately 11 cm deep abrasion without any active bleeding.  Some mild developing ecchymosis to right flank.  No left-sided chest pain, diaphoresis, nausea or vomiting.  Does have extensive cardiac history however symptoms do not see consistent with ACS, PE,  dissection.  Has chronic numbness to his right foot along with foot drop however patient states his gait is at his baseline.  Plan on labs, imaging and reassess.  Reassess.  Pain not controlled.  Additional pain medication  ordered  Labs and imaging personally reviewed and interpreted: CBC without leukocytosis Metabolic panel with mild hyponatremia at 131 however hyperglycemia 231 likely pseudohyponatremia, BUN 24 UA negative for infection, blood Repeat CBG after IV fluids 170 Covid pending EKG without ischemic changes CT chest abdomen pelvis with rib fractures 10, 11, 12 with overlying contusion and hematoma  Patient reassessed.  States unable to take deep breath or walk due to right flank pain.  Dilaudid ordered.  Discussed inpatient management for pain control with multiple rib fractures.  He is agreeable to this. No lacerations to suture. NO syncope episodes.  CONSULT with Dr. Bobbye Morton with Trauma surgery, Will follow, Requests primary medicine admit 2/2 multiple co-morbidities.  CONSULT with Dr. Doristine Bosworth agrees to accept patient in transfer for admission for further managment. Will notify Trauma service on patient transfer.   Patient seen eval by attending, Dr. Tamera Punt who agrees above treatment, plan and disposition  The patient appears reasonably stabilized for admission considering the current resources, flow, and capabilities available in the ED at this time, and I doubt any other [facility] requiring further screening and/or treatment in the ED prior to admission.     MDM Rules/Calculators/A&P                           Final Clinical Impression(s) / ED Diagnoses Final diagnoses:  Fall, initial encounter  Closed fracture of multiple ribs of right side, initial encounter  Hematoma of right flank, initial encounter    Rx / DC Orders ED Discharge Orders    None       Lazariah Savard A, PA-C 11/19/19 1645    Malvin Johns, MD 11/20/19 0730

## 2019-11-19 NOTE — Progress Notes (Signed)
Subjective:    Patient ID: Spencer [name], male    DOB: 02/14/1956, 64 y.o.   MRN: [id number]  HPI Spencer [name] is a pleasant 64 year old white male, established with Dr. Henrene Pastor with prior history of hyperplastic polyps.  Last colonoscopy 2014 with fair prep and redundant colon and was advised to have 1 year follow-up. Patient comes in today with complaints of recent significant constipation, and left-sided abdominal pain. He has history of coronary artery disease is status post MI and CABG in 2012, had coronary stents placed in 2015 and is maintained on aspirin and Effient.  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  He also has chronic back pain, and previous lumbar fusion. He underwent CT of the abdomen pelvis on 11/09/2019 ordered by his PCP which did show moderate stool in the colon, aortic atherosclerosis small punctate nonobstructing bilateral kidney stones. Patient says that he has been having some significant issues with constipation over the past couple of months and actually got to the point where he could not have a bowel movement a couple of weeks ago.  His wife gave him a couple of tapwater enemas on 2 occasions with good results and says he was much more comfortable after that.  He was then started on MiraLAX on a daily basis by his PCP and that has been working well.  He is currently having bowel movements daily.  No melena or hematochezia. He had also been noticing what he describes as a jabbing and sharp pain in the left abdomen laterally which has been present for most of this year.  This seems to be worse after eating, and sometimes is better after bowel movements though never completely resolves.  He denies any radiation around into the back though pain is located very laterally.  He is not sure that he notices any positional changes. Patient mentions that he does have issues with urinary retention and is  having to self cath usually at least once daily.  Review of Systems Pertinent positive and negative review of systems were noted in the above HPI section.  All other review of systems was otherwise negative.  No facility-administered encounter medications on file as of 11/18/2019.   Outpatient Encounter Medications as of 11/18/2019  Medication Sig  . Ascorbic Acid (VITAMIN C) 1000 MG tablet Take 1,000 mg by mouth daily.  Marland Kitchen aspirin EC 81 MG tablet Take 81 mg by mouth daily.   . carvedilol (COREG) 6.25 MG tablet TAKE 1 TABLET BY MOUTH TWICE DAILY  . cholecalciferol (VITAMIN D) 1000 UNITS tablet Take 1,000 Units by mouth daily.  Marland Kitchen docusate sodium (COLACE) 100 MG capsule Take 100 mg by mouth 2 (two) times daily.  . famotidine (PEPCID) 20 MG tablet Take 20 mg by mouth 2 (two) times daily.  Marland Kitchen FREESTYLE LITE test strip   . HUMALOG KWIKPEN 100 UNIT/ML KwikPen Inject into the skin.  Vanessa Kick Ethyl (VASCEPA) 1 g CAPS Take 2 capsules (2 g total) by mouth 2 (two) times daily.  Marland Kitchen JARDIANCE 25 MG TABS tablet Take 25 mg by mouth daily.   Marland Kitchen LANTUS SOLOSTAR 100 UNIT/ML Solostar Pen Inject 27 Units into the skin daily.   Marland Kitchen lisinopril (PRINIVIL,ZESTRIL) 40 MG tablet Take 1 tablet (40 mg total) by mouth daily.  . metFORMIN (GLUCOPHAGE-XR) 500 MG 24 hr tablet Take 1,000 mg by mouth 2 (two) times daily. 0530 & 1730  . methocarbamol (ROBAXIN) 500 MG tablet  Take 500 mg by mouth every 8 (eight) hours. scheduled  . metroNIDAZOLE (METROGEL) 1 % gel Apply 1 application topically daily as needed (rosacea).  . nitroGLYCERIN (NITROSTAT) 0.4 MG SL tablet Place 1 tablet (0.4 mg total) under the tongue every 5 (five) minutes as needed for chest pain (CP or SOB).  Marland Kitchen PENTIPS 31G X 5 MM MISC   . polyethylene glycol powder (MIRALAX) powder Take 17 g by mouth at bedtime as needed (constipation).   . prasugrel (EFFIENT) 10 MG TABS tablet Take 1 tablet by mouth daily.  . pregabalin (LYRICA) 150 MG capsule Take 150 mg by mouth 2  (two) times daily. 0530 & 1730  . Probiotic Product (PROBIOTIC DAILY) CAPS Take 1 capsule by mouth every evening.   . pyridOXINE (VITAMIN B-6) 100 MG tablet Take 100 mg by mouth daily.   Marland Kitchen REPATHA SURECLICK 016 MG/ML SOAJ INJECT 140 MG INTO THE SKIN EVERY 14 DAYS.  Marland Kitchen sertraline (ZOLOFT) 50 MG tablet Take 50 mg by mouth 2 (two) times daily. 0530 & 1730  . traMADol (ULTRAM) 50 MG tablet Take 100 mg by mouth every 6 (six) hours. scheduled  . zolpidem (AMBIEN) 10 MG tablet Take 5 mg by mouth at bedtime.   . Sodium Sulfate-Mag Sulfate-KCl (SUTAB) [phone] MG TABS Take 1 kit by mouth as directed.  . [DISCONTINUED] famotidine (PEPCID) 10 MG tablet Take 20 mg by mouth 2 (two) times daily.   Allergies  Allergen Reactions  . Victoza [Liraglutide] Other (See Comments)    pancreatitis  . Brilinta [Ticagrelor]     Dyspnea  . Codeine Other (See Comments)    hyperactive  . Other Nausea And Vomiting    Mayonnaise causes nausea and vomiting  . Statins     Muscle weakness & fatigue   Patient Active Problem List   Diagnosis Date Noted  . Unstable angina ([place]) 07/23/2013  . Angina at rest [facility]) 07/22/2013  . Crescendo angina ([place]) 07/22/2013  . Right carotid bruit 05/26/2012  . Morbid obesity ([place]) 04/28/2012  . Hx of colonic polyps 04/28/2012  . Shortness of breath 12/11/2010  . Coronary artery disease   . Hypertension   . Hyperlipidemia   . Type II or unspecified type diabetes mellitus without mention of complication, uncontrolled   . CONTACT DERMATITIS 04/17/2008   Social History   Socioeconomic History  . Marital status: Married    Spouse name: Not on file  . Number of children: Not on file  . Years of education: Not on file  . Highest education level: Not on file  Occupational History  . Occupation: RETIRED  . Occupation: disabled    Employer: DISABLED  Tobacco Use  . Smoking status: Never Smoker  . Smokeless tobacco: Never Used  Vaping Use  . Vaping Use: Never used    Substance and Sexual Activity  . Alcohol use: No  . Drug use: No  . Sexual activity: Not Currently  Other Topics Concern  . Not on file  Social History Narrative   WATER AEROBICS 4-5 DAYS A WEEK   Social Determinants of Health   Financial Resource Strain:   . Difficulty of Paying Living Expenses: Not on file  Food Insecurity:   . Worried About Charity fundraiser in the Last Year: Not on file  . Ran Out of Food in the Last Year: Not on file  Transportation Needs:   . Lack of Transportation (Medical): Not on file  . Lack of Transportation (Non-Medical): Not on file  Physical Activity:   . Days of Exercise per Week: Not on file  . Minutes of Exercise per Session: Not on file  Stress:   . Feeling of Stress : Not on file  Social Connections:   . Frequency of Communication with Friends and Family: Not on file  . Frequency of Social Gatherings with Friends and Family: Not on file  . Attends Religious Services: Not on file  . Active Member of Clubs or Organizations: Not on file  . Attends Archivist Meetings: Not on file  . Marital Status: Not on file  Intimate Partner Violence:   . Fear of Current or Ex-Partner: Not on file  . Emotionally Abused: Not on file  . Physically Abused: Not on file  . Sexually Abused: Not on file    Spencer [name]'s family history is not on file.      Objective:    Vitals:   11/18/19 1446  BP: 130/78  Pulse: 71    Physical Exam Well-developed well-nourished older white male in no acute distress.  Height, Weight, 285 BMI 37.6  HEENT; nontraumatic normocephalic, EOMI, PE RR LA, sclera anicteric. Oropharynx; not examined Neck; supple, no JVD Cardiovascular; regular rate and rhythm with S1-S2, no murmur rub or gallop sternal incisional scar Pulmonary; Clear bilaterally Abdomen; soft, obese is tender in the left mid abdomen laterally, nondistended, no palpable mass or hepatosplenomegaly, bowel sounds are active Rectal; not done  today Skin; benign exam, no jaundice rash or appreciable lesions Extremities; no clubbing cyanosis or edema skin warm and dry Neuro/Psych; alert and oriented x4, grossly nonfocal mood and affect appropriate       Assessment & Plan:   #44 64 year old white male with recent significant constipation, and complaints of left mid lateral abdominal pain over the past several months. Recent CT scan reassuring though did note moderate stool in the colon. Constipation is actually better at this point after several enemas and initiation of MiraLAX which seems to be effective in producing bowel movements daily. Etiology of the left-sided abdominal pain is not clear.  This may be musculoskeletal though he does note some increase in discomfort postprandially.  Colon cancer surveillance-last colonoscopy 2014, fair prep and redundant colon and was recommended to have 1 year interval follow-up which has not been done.  #2 coronary artery disease status post MI 2012 CABG 2012 and stents 2015. #3.  Chronic antiplatelet therapy on aspirin and Effient #4.  Mild cardiomyopathy EF 45% 5.  Morbid obesity BMI 37 6.  Adult onset diabetes mellitus 7.  Peripheral arterial disease #8, GERD #9 history of urinary retention  Plan; Patient will be scheduled for colonoscopy and EGD with Dr. Henrene Pastor.  Both procedures were discussed in detail with patient including indications risks and benefits and he is agreeable to proceed. Effient will need to be held for 7 days prior to procedures.  We will communicate with his cardiologist Dr. Fletcher Anon to assure this is reasonable for this patient. Patient has completed COVID-19 vaccination. Continue MiraLAX 17 g in 8 ounces of water daily, patient advised to increase to twice daily as needed. Continue famotidine 20 mg p.o. twice daily. May benefit from an antispasmodic to see if this would help with his abdominal pain, however contraindicated with urinary retention issues.  Spencer [name] Genia Harold PA-C 11/19/2019   Cc: Burnard Bunting, MD

## 2019-11-19 NOTE — H&P (Signed)
History and Physical    Spencer [name] ZOX:[id number] DOB: 1955-05-08 DOA: 11/19/2019  PCP: Burnard Bunting, MD  Patient coming from: [facility]  I have personally briefly reviewed patient's old medical records in [place]  Chief Complaint: Posterior right chest wall and flank pain after fall  HPI: Spencer [name] is a 64 y.o. male with medical history significant for CAD s/p CABG and PCI with DES, insulin-dependent type 2 diabetes, HTN, HLD, PAD/CAS, chronic back pain s/p lumbar fusion with neuropathy and left foot drop, and obesity who presents to the ED for evaluation of posterior right chest wall and flank pain after a fall.  Patient states he goes to the [facility] 3 times a week to exercise in the pool.  He went this morning and while changing into a swimming trunks he lost his balance and fell backwards hitting his right flank on the edge of the hard metal bench in the locker room.  He had significant pain immediately and trouble with deep inspiration.  He says he did not hit his head or lose consciousness.  He did not have any associated lightheadedness/dizziness, chest pain, palpitations, nausea, vomiting.  He denies any cough or hemoptysis.  He takes aspirin and Effient for history of prior drug-eluting stent.  He says he has chronic left foot drop since prior lumbar fusion.  He has a cane and walker at home but says he does not generally require these for ambulation.  He says he is on tramadol and Robaxin chronically for his back pain.  [facility] ED Course:  Initial vitals showed BP 139/76, pulse 58, RR 18, temp 98.3 Fahrenheit, SPO2 100% on room air.  Labs show WBC 9.3, hemoglobin 14.4, platelets 209,000, sodium 131, potassium 4.6, bicarb 26, BUN 24, creatinine 0.6, serum glucose 231, LFTs within normal limits. Urinalysis is negative for UTI. Respiratory PCR panel was negative for SARS-CoV-2 or influenza A/B.  CT chest/abdomen/pelvis with contrast was  obtained and shows right posterior lateral 10th, 11th, and 12th rib fractures with overlying right upper flank contusion/hematoma. No pneumothorax or evidence of underlying liver injury seen. No acute intra-abdominal/intrapelvic injury identified.  EDP discussed with on-call trauma surgery who recommended medical admission and trauma surgery will follow. The hospitalist service was consulted to admit for further evaluation and management.  Review of Systems: All systems reviewed and are negative except as documented in history of present illness above.   Past Medical History:  Diagnosis Date  . Anxiety attack   . Arthritis    "back; shoulders; knees" (07/23/2013)  . Back pain, chronic    "all over" (07/23/2013)  . Benign neoplasm of colon 04/01/2000   Hyperplastic  . Coronary artery disease    a. NSTEMI 10/12: s/p CABG with L-LAD, S-OM1/OM2, S-RV marg/RCA;  b. 07/2013 Cath/PCI: LM <10, LAD 70-80p, 21m D1 90, RI 30ost, LCX 40-50p, 930mOM1 90, OM2 100, RCA 100p, VG->OM1->OM2 patent with 90 @ anast (2.5x12 Xience Alpine DES), RCA 100p, LIMA->LAD nl, VG->Diag nl, VG->RV marginal/RCA 90p (3.0x23 Xience Alpine DES);  c. 07/2013 Echo: EF 55-60%.  . Depression    "pain related"  . DM2 (diabetes mellitus, type 2) ([facility]  . GERD (gastroesophageal reflux disease)   . Hyperlipidemia   . Hypertension   . IBS (irritable bowel syndrome)   . Knee pain, bilateral    a. since 01/2013.  . Morbidly [place]  . Myocardial infarction ([facility]  . Obesity   .  Pleural effusion, left 11/2010   Postop; resolved with p.o. diuresis    Past Surgical History:  Procedure Laterality Date  . ABDOMINAL AORTOGRAM W/LOWER EXTREMITY N/A 07/30/2017   Procedure: ABDOMINAL AORTOGRAM W/LOWER EXTREMITY;  Surgeon: Wellington Hampshire, MD;  Location: [facility];  Service: Cardiovascular;  Laterality: N/A;  . BACK SURGERY    . CARDIAC CATHETERIZATION  11/2010  . CARPAL TUNNEL RELEASE Bilateral   . CORONARY  ANGIOPLASTY WITH STENT PLACEMENT  07/23/2013   "2"  . CORONARY ARTERY BYPASS GRAFT  11/26/10   x 6 SURGEON DR. PETER VAN TRIGT  . HERNIA REPAIR     UHR  . LEFT HEART CATHETERIZATION WITH CORONARY/GRAFT ANGIOGRAM N/A 07/23/2013   Procedure: LEFT HEART CATHETERIZATION WITH Beatrix Fetters;  Surgeon: Peter M Martinique, MD;  Location: [facility] CATH LAB;  Service: Cardiovascular;  Laterality: N/A;  . LUMBAR FUSION     MULTIPLE; DR. Ellene Route  . SHOULDER ARTHROSCOPY W/ ROTATOR CUFF REPAIR Bilateral   . UMBILICAL HERNIA REPAIR      Social History:  reports that he has never smoked. He has never used smokeless tobacco. He reports that he does not drink alcohol and does not use drugs.  Allergies  Allergen Reactions  . Victoza [Liraglutide] Other (See Comments)    pancreatitis  . Brilinta [Ticagrelor]     Dyspnea  . Codeine Other (See Comments)    hyperactive  . Other Nausea And Vomiting    Mayonnaise causes nausea and vomiting  . Statins     Muscle weakness & fatigue    Family History  Problem Relation Age of Onset  . Hypertension Neg Hx   . Heart disease Neg Hx   . Peripheral vascular disease Neg Hx   . Diabetes Neg Hx      Prior to Admission medications   Medication Sig Start Date End Date Taking? Authorizing Provider  Ascorbic Acid (VITAMIN C) 1000 MG tablet Take 1,000 mg by mouth daily.   Yes [provider]  aspirin EC 81 MG tablet Take 81 mg by mouth daily.    Yes [provider]  carvedilol (COREG) 6.25 MG tablet TAKE 1 TABLET BY MOUTH TWICE DAILY Patient taking differently: Take 6.25 mg by mouth in the morning and at bedtime.  10/22/19  Yes Wellington Hampshire, MD  cholecalciferol (VITAMIN D) 1000 UNITS tablet Take 1,000 Units by mouth daily.   Yes [provider]  docusate sodium (COLACE) 100 MG capsule Take 100 mg by mouth 2 (two) times daily.   Yes [provider]  famotidine (PEPCID) 20 MG tablet Take 20 mg by mouth 2 (two) times daily.  06/14/19  Yes [provider]  HUMALOG KWIKPEN 100 UNIT/ML KwikPen Inject 4 Units into the skin See admin instructions. With meals as directed 06/15/19  Yes [provider]  Icosapent Ethyl (VASCEPA) 1 g CAPS Take 2 capsules (2 g total) by mouth 2 (two) times daily. 09/16/17  Yes Wellington Hampshire, MD  Insulin Glargine (BASAGLAR KWIKPEN) 100 UNIT/ML Inject 32 Units into the skin daily.   Yes [provider]  JARDIANCE 25 MG TABS tablet Take 25 mg by mouth daily.  07/08/17  Yes [provider]  lisinopril (PRINIVIL,ZESTRIL) 40 MG tablet Take 1 tablet (40 mg total) by mouth daily. 07/24/13  Yes Theora Gianotti, NP  metFORMIN (GLUCOPHAGE-XR) 500 MG 24 hr tablet Take 500 mg by mouth 2 (two) times daily. 0530 & 1730 05/23/17  Yes [provider]  methocarbamol (ROBAXIN) 500 MG tablet Take 500 mg by mouth every 8 (eight) hours. scheduled   Yes [provider]  metroNIDAZOLE (METROGEL) 1 % gel Apply 1 application topically daily as needed (rosacea).   Yes [provider]  nitroGLYCERIN (NITROSTAT) 0.4 MG SL tablet Place 1 tablet (0.4 mg total) under the tongue every 5 (five) minutes as needed for chest pain (CP or SOB). 07/24/13  Yes Theora Gianotti, NP  polyethylene glycol powder (MIRALAX) powder Take 17 g by mouth daily as needed (constipation).    Yes [provider]  prasugrel (EFFIENT) 10 MG TABS tablet Take 1 tablet by mouth daily. 10/21/17  Yes [provider]  pregabalin (LYRICA) 150 MG capsule Take 150 mg by mouth 2 (two) times daily. 0530 & 1730   Yes [provider]  Probiotic Product (PROBIOTIC PO) Take 1 each by mouth daily. Keifer liquid yogurt probiotic   Yes [provider]  pyridOXINE (VITAMIN B-6) 100 MG tablet Take 100 mg by mouth daily.    Yes [provider]  REPATHA SURECLICK 888 MG/ML SOAJ INJECT 140 MG INTO THE SKIN EVERY 14 DAYS. Patient taking differently: Inject 140  mg into the skin every 14 (fourteen) days.  01/19/19  Yes Wellington Hampshire, MD  sertraline (ZOLOFT) 50 MG tablet Take 50 mg by mouth 2 (two) times daily. 0530 & 1730   Yes [provider]  traMADol (ULTRAM) 50 MG tablet Take 100 mg by mouth in the morning, at noon, and at bedtime. scheduled   Yes [provider]  zolpidem (AMBIEN) 10 MG tablet Take 5 mg by mouth at bedtime.    Yes [provider]  FREESTYLE LITE test strip  05/05/19   [provider]  PENTIPS 31G X 5 MM MISC  05/05/19   [provider]  Sodium Sulfate-Mag Sulfate-KCl (SUTAB) [phone] MG TABS Take 1 kit by mouth as directed. 11/18/19   Alfredia Ferguson, PA-C    Physical Exam: Vitals:   11/19/19 1600 11/19/19 1754 11/19/19 1858 11/19/19 1904  BP: (!) 147/72 (!) 166/77  (!) 168/68  Pulse: (!) 59 69  70  Resp: '13 12  18  ' Temp:    98.1 F (36.7 C)  TempSrc:    Oral  SpO2: 99% 99%  98%  Weight:   130 kg   Height:   '6\' 1"'  (1.854 m)    Constitutional: Resting supine in bed, NAD, calm, comfortable Eyes: PERRL, lids and conjunctivae normal ENMT: Mucous membranes are moist. Posterior pharynx clear of any exudate or lesions.Normal dentition.  Neck: normal, supple, no masses. Respiratory: clear to auscultation bilaterally, no wheezing, no crackles.  Deep inspiration limited due to right rib fractures. No accessory muscle use.  Cardiovascular: Regular rate and rhythm, no murmurs / rubs / gallops. No extremity edema. 2+ pedal pulses. Abdomen: no tenderness, no masses palpated. No hepatosplenomegaly. Bowel sounds positive.  Musculoskeletal: Tender to palpation right lateral lower ribs.  ROM with flexion of left ankle slightly diminished otherwise intact all other extremities. Skin: Linear abrasion along the right lateral lower ribs without open wound or active bleeding.  Surrounding early bruising present. Neurologic: CN 2-12 grossly intact. Sensation intact, Strength 5/5 in all 4.    Psychiatric: Normal judgment and insight. Alert and oriented x 3. Normal mood.    Labs on Admission: I have personally reviewed following labs and imaging studies  CBC: Recent Labs  Lab 11/19/19 1124  WBC 9.3  NEUTROABS 7.5  HGB  14.4  HCT 44.1  MCV 90.0  PLT 573   Basic Metabolic Panel: Recent Labs  Lab 11/19/19 1124  NA 131*  K 4.6  CL 95*  CO2 26  GLUCOSE 231*  BUN 24*  CREATININE 0.86  CALCIUM 8.9   GFR: Estimated Creatinine Clearance: 125.8 mL/min (by C-G formula based on SCr of 0.86 mg/dL). Liver Function Tests: Recent Labs  Lab 11/19/19 1124  AST 24  ALT 28  ALKPHOS 66  BILITOT 0.5  PROT 7.7  ALBUMIN 4.0   No results for input(s): LIPASE, AMYLASE in the last 168 hours. No results for input(s): AMMONIA in the last 168 hours. Coagulation Profile: No results for input(s): INR, PROTIME in the last 168 hours. Cardiac Enzymes: No results for input(s): CKTOTAL, CKMB, CKMBINDEX, TROPONINI in the last 168 hours. BNP (last 3 results) No results for input(s): PROBNP in the last 8760 hours. HbA1C: No results for input(s): HGBA1C in the last 72 hours. CBG: Recent Labs  Lab 11/19/19 1420  GLUCAP 170*   Lipid Profile: No results for input(s): CHOL, HDL, LDLCALC, TRIG, CHOLHDL, LDLDIRECT in the last 72 hours. Thyroid Function Tests: No results for input(s): TSH, T4TOTAL, FREET4, T3FREE, THYROIDAB in the last 72 hours. Anemia Panel: No results for input(s): VITAMINB12, FOLATE, FERRITIN, TIBC, IRON, RETICCTPCT in the last 72 hours. Urine analysis:    Component Value Date/Time   COLORURINE YELLOW 11/19/2019 1107   APPEARANCEUR CLEAR 11/19/2019 1107   LABSPEC 1.010 11/19/2019 1107   PHURINE 6.5 11/19/2019 1107   GLUCOSEU >=500 (A) 11/19/2019 1107   HGBUR NEGATIVE 11/19/2019 1107   HGBUR negative 04/17/2008 1319   BILIRUBINUR NEGATIVE 11/19/2019 1107   KETONESUR NEGATIVE 11/19/2019 1107   PROTEINUR NEGATIVE 11/19/2019 1107   UROBILINOGEN 0.2 11/25/2010  1039   NITRITE NEGATIVE 11/19/2019 1107   LEUKOCYTESUR NEGATIVE 11/19/2019 1107    Radiological Exams on Admission: CT CHEST ABDOMEN PELVIS W CONTRAST  Result Date: 11/19/2019 CLINICAL DATA:  [place] today.  Trauma. EXAM: CT CHEST, ABDOMEN, AND PELVIS WITH CONTRAST TECHNIQUE: Multidetector CT imaging of the chest, abdomen and pelvis was performed following the standard protocol during bolus administration of intravenous contrast. CONTRAST:  147m OMNIPAQUE IOHEXOL 300 MG/ML  SOLN COMPARISON:  CT scan 08/12/2015 FINDINGS: CT CHEST FINDINGS Cardiovascular: The heart is normal in size. No pericardial effusion. 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 Age advanced three-vessel coronary artery calcifications. Scattered age advanced aortic calcifications. The aorta is normal in caliber. No focal aneurysm or dissection. 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 Mediastinum/Nodes: No mediastinal or hilar mass or adenopathy or hematoma. The esophagus is unremarkable. Lungs/Pleura: No acute pulmonary findings. No pulmonary contusion, infiltrate, mass, pleural effusion or pneumothorax. Musculoskeletal: There are fractures of the right tenth, eleventh and twelfth ribs. The eleventh rib fracture is minimally displaced. No underlying injury to the liver is identified. There is an overlying contusion/hematoma in the right upper flank area with small hematoma. CT ABDOMEN PELVIS FINDINGS Hepatobiliary: No hepatic lesions or acute hepatic injury. No perihepatic fluid collections. The gallbladder is unremarkable. No common bile duct dilatation. Pancreas: No mass, inflammation or ductal dilatation. No evidence of acute pancreatic injury or peripancreatic fluid collection. Spleen: Normal size. No acute injury or perisplenic fluid collection. Adrenals/Urinary Tract: The adrenal glands are normal. No acute renal injury or renal mass. No hydronephrosis.  The bladder is unremarkable. Stomach/Bowel: The stomach, duodenum, small bowel and colon are grossly normal without oral contrast.  No acute inflammatory changes, mass lesions or obstructive findings. Vascular/Lymphatic: Age advanced atherosclerotic calcifications involving the aorta and branch vessels. No aneurysm or dissection. No mesenteric or retroperitoneal mass, adenopathy or hematoma. Reproductive: The prostate gland and seminal vesicles are unremarkable. Other: No pelvic mass or adenopathy. No free pelvic fluid collections. No inguinal mass or adenopathy. No abdominal wall hernia or subcutaneous lesions. Musculoskeletal: Extensive lumbosacral fusion hardware. No obvious complicating features. I do not see any significant solid interbody fusion changes at L5-S1. The bony pelvis is intact. The pubic symphysis and SI joints are intact. Moderate bilateral SI joint degenerative changes are noted along with mild to moderate bilateral hip joint degenerative changes. No acute pelvic fracture. IMPRESSION: 1. Right posterolateral tenth, eleventh and twelfth rib fractures. Overlying right upper flank contusion/hematoma. No pneumothorax or evidence of underlying liver injury. 2. No acute pulmonary findings and normal appearance of the heart and great vessels other than atherosclerotic disease. 3. No acute intra-abdominal/intrapelvic injury is identified. 4. Age advanced atherosclerotic calcifications involving the thoracic and abdominal aorta and branch vessels including the coronary arteries. Aortic Atherosclerosis (ICD10-I70.0). Electronically Signed   By: Marijo Sanes M.D.   On: 11/19/2019 13:07    EKG: Independently reviewed. Normal sinus rhythm, RBBB. Not significantly changed when compared to prior.  Assessment/Plan Principal Problem:   Multiple fractures of ribs, right side, initial encounter for closed fracture Active Problems:   Coronary artery disease   Hypertension associated with diabetes ([place])    DM2 (diabetes mellitus, type 2) ([place])   Fall   Hyperlipidemia associated with type 2 diabetes mellitus ([place])   Back pain, chronic  Spencer [name] is a 64 y.o. male with medical history significant for CAD s/p CABG and PCI with DES, insulin-dependent type 2 diabetes, HTN, HLD, PAD/CAS, chronic back pain s/p lumbar fusion with neuropathy and left foot drop, and obesity who is admitted for right posterolateral rib fractures.  Right posterior lateral 10,11,12th rib fractures after injury from fall: Seen on CT imaging without evidence of pneumothorax.  Occurring after mechanical fall.  Trauma surgery consulted by EDP. -Continue pain control with scheduled Tylenol, home tramadol prn, and as needed IV morphine with hold parameters -Start incentive spirometer -Supplemental oxygen prn -PT eval -Trauma surgery to see, appreciate further recommendations  CAD s/p CABG and PCI with DES: Chronic and stable.  Denies any chest pain.  Plan to resume home aspirin and Effient tomorrow morning.  Insulin-dependent type 2 diabetes: Continue home [place]. Add sensitive SSI and hold home Metformin and Jardiance while in hospital.  Hypertension: Stable, continue lisinopril.  Hyperlipidemia: Intolerant to statins due to myalgia. Managed by cardiology with [place] and [place].  PAD/carotid artery disease: Follows with cardiology, Dr. Domenick Gong. Has one-vessel runoff below the knees bilaterally via the peroneal artery with no other obstructive disease. Carotid ultrasound 08/11/2019 shows 40-59% right ICA stenosis and 60-79% left ICA stenosis. -Resume home aspirin and Effient tomorrow  Chronic back pain s/p lumbar fusion with neuropathy and left foot drop: Continue home tramadol and Robaxin.  Depression/anxiety: Continue sertraline.  DVT prophylaxis: Will schedule Lovenox for tomorrow evening if remains in hospital Code Status: Full code, confirmed with patient Family Communication: Discussed with patient, he  has discussed with family Disposition Plan: From home and likely discharge to home pending adequate pain control, respiratory status, and trauma surgery recommendations Consults called: Trauma surgery Admission status:   Status is: Observation  The patient remains OBS appropriate and will d/c before 2 midnights.  Dispo: The patient is  from: Home              Anticipated d/c is to: Home              Anticipated d/c date is: 1 day pending adequate pain control, respiratory status, and trauma surgery evaluation/recommendations.              Patient currently is not medically stable to d/c.  Zada Finders MD [facility]  If 7PM-7AM, please contact night-coverage [email or web address]  11/19/2019, 8:06 PM

## 2019-11-19 NOTE — ED Triage Notes (Signed)
Reports while changing at the [facility] he fell backwards and was unable to catch himself.  Fell into an alluminum bench bolted to the floor.  Abrasion noted to right lower back.  Took tramadol X 2 and Robaxin at 0700.  Taking effient for stent and baby asa.  Reports its difficult to take a deep breath due to pain.

## 2019-11-20 DIAGNOSIS — W19XXXA Unspecified fall, initial encounter: Secondary | ICD-10-CM

## 2019-11-20 DIAGNOSIS — I2510 Atherosclerotic heart disease of native coronary artery without angina pectoris: Secondary | ICD-10-CM | POA: Diagnosis not present

## 2019-11-20 DIAGNOSIS — Z79899 Other long term (current) drug therapy: Secondary | ICD-10-CM | POA: Diagnosis not present

## 2019-11-20 DIAGNOSIS — Z20822 Contact with and (suspected) exposure to covid-19: Secondary | ICD-10-CM | POA: Diagnosis not present

## 2019-11-20 DIAGNOSIS — I25110 Atherosclerotic heart disease of native coronary artery with unstable angina pectoris: Secondary | ICD-10-CM | POA: Diagnosis not present

## 2019-11-20 DIAGNOSIS — S2241XA Multiple fractures of ribs, right side, initial encounter for closed fracture: Principal | ICD-10-CM

## 2019-11-20 DIAGNOSIS — S301XXA Contusion of abdominal wall, initial encounter: Secondary | ICD-10-CM | POA: Diagnosis not present

## 2019-11-20 DIAGNOSIS — Z7984 Long term (current) use of oral hypoglycemic drugs: Secondary | ICD-10-CM | POA: Diagnosis not present

## 2019-11-20 DIAGNOSIS — E119 Type 2 diabetes mellitus without complications: Secondary | ICD-10-CM | POA: Diagnosis not present

## 2019-11-20 DIAGNOSIS — Z7982 Long term (current) use of aspirin: Secondary | ICD-10-CM | POA: Diagnosis not present

## 2019-11-20 DIAGNOSIS — I2583 Coronary atherosclerosis due to lipid rich plaque: Secondary | ICD-10-CM

## 2019-11-20 DIAGNOSIS — I119 Hypertensive heart disease without heart failure: Secondary | ICD-10-CM | POA: Diagnosis not present

## 2019-11-20 LAB — BASIC METABOLIC PANEL
Anion gap: 10 (ref 5–15)
BUN: 19 mg/dL (ref 8–23)
CO2: 25 mmol/L (ref 22–32)
Calcium: 8.9 mg/dL (ref 8.9–10.3)
Chloride: 102 mmol/L (ref 98–111)
Creatinine, Ser: 0.9 mg/dL (ref 0.61–1.24)
GFR calc Af Amer: >60 mL/min (ref >60)
GFR calc non Af Amer: >60 mL/min (ref >60)
Glucose, Bld: 200 mg/dL — ABNORMAL HIGH (ref 70–99)
Potassium: 4.1 mmol/L (ref 3.5–5.1)
Sodium: 137 mmol/L (ref 135–145)

## 2019-11-20 LAB — HEMOGLOBIN A1C
Hgb A1c MFr Bld: 9 % — ABNORMAL HIGH (ref 4.8–5.6)
Mean Plasma Glucose: 211.6 mg/dL

## 2019-11-20 LAB — GLUCOSE, CAPILLARY: Glucose-Capillary: 167 mg/dL — ABNORMAL HIGH (ref 70–99)

## 2019-11-20 LAB — HIV ANTIBODY (ROUTINE TESTING W REFLEX): HIV Screen 4th Generation wRfx: NONREACTIVE

## 2019-11-20 MED ORDER — OXYCODONE HCL 5 MG PO CAPS
5.0000 mg | ORAL_CAPSULE | ORAL | 0 refills | Status: DC | PRN
Start: 2019-11-20 — End: 2021-01-30

## 2019-11-20 MED ORDER — SENNOSIDES-DOCUSATE SODIUM 8.6-50 MG PO TABS: 1.0000 | ORAL_TABLET | Freq: Every evening | ORAL | 0 refills | Status: DC | PRN

## 2019-11-20 MED ORDER — METFORMIN HCL ER 500 MG PO TB24: 1000.0000 mg | ORAL_TABLET | Freq: Two times a day (BID) | ORAL | 3 refills | Status: DC

## 2019-11-20 NOTE — Evaluation (Signed)
Physical Therapy Evaluation/ Discharge Patient Details Name: Spencer [name] MRN: [id number] DOB: 08-Jul-1955 Today's Date: 11/20/2019   History of Present Illness  64 yo admitted after fall while changing clothes at [facility] hitting a bench with right 10-12 rib fx. PMHx: chronic back pain s/p lumbar fusion with left foot drop, CAD, DM, GERD, HTN, HLD, IBS, obesity  Clinical Impression  Pt with LLE weakness with foot drop and decreased internal rotation from back surgery with nerve injury. Pt with balance and gait deficits due to this but has been ongoing for years with pt declining AFO previously as well as OPPT. Pt states current function as baseline and politely declined OPPT for any additional balance training. Pt educated for sitting for dressing lower body to limit possibility of falls with other precautions already in place such as AD use in community. Pt educated for IS use and able to pull 2500 cc appropriately. Pt with all questions answered and education completed with wife present. Pt appropriate for return home and encouraged to continue his water activities for exercise. Will sign off with pt aware and agreeable.     Follow Up Recommendations Outpatient PT (pt politely declines)    Equipment Recommendations  None recommended by PT    Recommendations for Other Services       Precautions / Restrictions Precautions Precautions: Fall Precaution Comments: left foot drop      Mobility  Bed Mobility               General bed mobility comments: pt sitting on EOB on arrival  Transfers Overall transfer level: Modified independent               General transfer comment: pt able to stand from bed x 2 and don socks and shoes EOB with hip hiking  Ambulation/Gait Ambulation/Gait assistance: Modified independent (Device/Increase time) Gait Distance (Feet): 300 Feet Assistive device: None Gait Pattern/deviations: Step-through pattern;Decreased stride length;Decreased  dorsiflexion - left   Gait velocity interpretation: >2.62 ft/sec, indicative of community ambulatory General Gait Details: pt with increased external rotation of LLE with foot drop. pt with decreased speed due to these deficits and walks with caution and slower speed  Stairs Stairs: Yes Stairs assistance: Modified independent (Device/Increase time) Stair Management: Step to pattern;Forwards;One rail Right Number of Stairs: 11 General stair comments: pt able to ascend and descend stairs safely with rail with step to pattern  Wheelchair Mobility    Modified Rankin (Stroke Patients Only)       Balance Overall balance assessment: Mild deficits observed, not formally tested                                           Pertinent Vitals/Pain Pain Assessment: No/denies pain    Home Living Family/patient expects to be discharged to:: Private residence Living Arrangements: Spouse/significant other Available Help at Discharge: Family;Available PRN/intermittently Type of Home: House Home Access: Level entry     Home Layout: Two level;Bed/bath upstairs Home Equipment: Cane - quad      Prior Function Level of Independence: Independent         Comments: pt normally walks slowly, uses a cart in grocery stores or scooter if out in community ([employer] or similar)     Journalist, newspaper        Extremity/Trunk Assessment   Upper Extremity Assessment Upper Extremity Assessment: Overall WFL for tasks  assessed    Lower Extremity Assessment Lower Extremity Assessment: LLE deficits/detail LLE Deficits / Details: pt with no active dorsiflexion and 2-/5 internal rotation of hip which are baseline since back sx per pt report    Cervical / Trunk Assessment Cervical / Trunk Assessment: Other exceptions Cervical / Trunk Exceptions: rounded shoulders  Communication   Communication: No difficulties  Cognition Arousal/Alertness: Awake/alert Behavior During Therapy:  WFL for tasks assessed/performed Overall Cognitive Status: Within Functional Limits for tasks assessed                                        General Comments General comments (skin integrity, edema, etc.): pt with balance deficits notable with gait and pt reports baseline    Exercises     Assessment/Plan    PT Assessment All further PT needs can be met in the next venue of care  PT Problem List Decreased balance       PT Treatment Interventions      PT Goals (Current goals can be found in the Care Plan section)  Acute Rehab PT Goals PT Goal Formulation: All assessment and education complete, DC therapy    Frequency     Barriers to discharge        Co-evaluation               AM-PAC PT "6 Clicks" Mobility  Outcome Measure Help needed turning from your back to your side while in a flat bed without using bedrails?: None Help needed moving from lying on your back to sitting on the side of a flat bed without using bedrails?: None Help needed moving to and from a bed to a chair (including a wheelchair)?: None Help needed standing up from a chair using your arms (e.g., wheelchair or bedside chair)?: None Help needed to walk in hospital room?: None Help needed climbing 3-5 steps with a railing? : None 6 Click Score: 24    End of Session   Activity Tolerance: Patient tolerated treatment well Patient left: in bed;with call bell/phone within reach;with family/visitor present Nurse Communication: Mobility status PT Visit Diagnosis: Other abnormalities of gait and mobility (R26.89)    Time: 1497-0263 PT Time Calculation (min) (ACUTE ONLY): 18 min   Charges:   PT Evaluation $PT Eval Low Complexity: 1 Low          [place], PT Acute Rehabilitation Services Pager: [phone] Office: [place] Gurfateh Mcclain 11/20/2019, 2:12 PM

## 2019-11-20 NOTE — Progress Notes (Signed)
Subjective: No acute complaints. Reports pain is controlled. No nausea/vomiting.   Objective: Vital signs in last 24 hours: Temp:  [98 F (36.7 C)-98.4 F (36.9 C)] 98.2 F (36.8 C) (10/02 0729) Pulse Rate:  [59-77] 71 (10/02 0729) Resp:  [12-20] 20 (10/02 0729) BP: (123-168)/(63-92) 123/63 (10/02 0729) SpO2:  [96 %-99 %] 99 % (10/02 0729) Weight:  [129.2 kg-130 kg] 129.2 kg (10/02 0042)    Intake/Output from previous day: 10/01 0701 - 10/02 0700 In: 504 [IV [id number]:440] Out: 1625 [Urine:1625] Intake/Output this shift: No intake/output data recorded.  PE: General: resting comfortably, NAD Neuro: alert and oriented Resp: normal work of breathing, lungs CTAB CV: RRR Abdomen: soft, nondistended, nontender to palpation  Lab Results:  Recent Labs    11/19/19 1124  WBC 9.3  HGB 14.4  HCT 44.1  PLT 209   BMET Recent Labs    11/19/19 1124 11/20/19 0409  NA 131* 137  K 4.6 4.1  CL 95* 102  CO2 26 25  GLUCOSE 231* 200*  BUN 24* 19  CREATININE 0.86 0.90  CALCIUM 8.9 8.9   PT/INR No results for input(s): LABPROT, INR in the last 72 hours. CMP     Component Value Date/Time   NA 137 11/20/2019 0409   NA 137 07/24/2017 1002   K 4.1 11/20/2019 0409   CL 102 11/20/2019 0409   CO2 25 11/20/2019 0409   GLUCOSE 200 (H) 11/20/2019 0409   BUN 19 11/20/2019 0409   BUN 19 07/24/2017 1002   CREATININE 0.90 11/20/2019 0409   CALCIUM 8.9 11/20/2019 0409   PROT 7.7 11/19/2019 1124   PROT 7.3 12/23/2017 0907   ALBUMIN 4.0 11/19/2019 1124   ALBUMIN 4.3 12/23/2017 0907   AST 24 11/19/2019 1124   ALT 28 11/19/2019 1124   ALKPHOS 66 11/19/2019 1124   BILITOT 0.5 11/19/2019 1124   BILITOT 0.4 12/23/2017 0907   GFRNONAA >60 11/20/2019 0409   GFRAA >60 11/20/2019 0409   Lipase     Component Value Date/Time   LIPASE 163 (H) 08/12/2015 1632       Studies/Results: CT CHEST ABDOMEN PELVIS W CONTRAST  Result Date: 11/19/2019 CLINICAL DATA:  [place] today.   Trauma. EXAM: CT CHEST, ABDOMEN, AND PELVIS WITH CONTRAST TECHNIQUE: Multidetector CT imaging of the chest, abdomen and pelvis was performed following the standard protocol during bolus administration of intravenous contrast. CONTRAST:  178mL OMNIPAQUE IOHEXOL 300 MG/ML  SOLN COMPARISON:  CT scan 08/12/2015 FINDINGS: CT CHEST FINDINGS Cardiovascular: The heart is normal in size. No pericardial effusion. 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 Age advanced three-vessel coronary artery calcifications. Scattered age advanced aortic calcifications. The aorta is normal in caliber. No focal aneurysm or dissection. 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 Mediastinum/Nodes: No mediastinal or hilar mass or adenopathy or hematoma. The esophagus is unremarkable. Lungs/Pleura: No acute pulmonary findings. No pulmonary contusion, infiltrate, mass, pleural effusion or pneumothorax. Musculoskeletal: There are fractures of the right tenth, eleventh and twelfth ribs. The eleventh rib fracture is minimally displaced. No underlying injury to the liver is identified. There is an overlying contusion/hematoma in the right upper flank area with small hematoma. CT ABDOMEN PELVIS FINDINGS Hepatobiliary: No hepatic lesions or acute hepatic injury. No perihepatic fluid collections. The gallbladder is unremarkable. No common bile duct dilatation. Pancreas: No mass, inflammation or ductal dilatation. No evidence of acute pancreatic injury or peripancreatic fluid collection. Spleen:  Normal size. No acute injury or perisplenic fluid collection. Adrenals/Urinary Tract: The adrenal glands are normal. No acute renal injury or renal mass. No hydronephrosis. The bladder is unremarkable. Stomach/Bowel: The stomach, duodenum, small bowel and colon are grossly normal without oral contrast. No acute inflammatory changes, mass lesions or obstructive findings.  Vascular/Lymphatic: Age advanced atherosclerotic calcifications involving the aorta and branch vessels. No aneurysm or dissection. No mesenteric or retroperitoneal mass, adenopathy or hematoma. Reproductive: The prostate gland and seminal vesicles are unremarkable. Other: No pelvic mass or adenopathy. No free pelvic fluid collections. No inguinal mass or adenopathy. No abdominal wall hernia or subcutaneous lesions. Musculoskeletal: Extensive lumbosacral fusion hardware. No obvious complicating features. I do not see any significant solid interbody fusion changes at L5-S1. The bony pelvis is intact. The pubic symphysis and SI joints are intact. Moderate bilateral SI joint degenerative changes are noted along with mild to moderate bilateral hip joint degenerative changes. No acute pelvic fracture. IMPRESSION: 1. Right posterolateral tenth, eleventh and twelfth rib fractures. Overlying right upper flank contusion/hematoma. No pneumothorax or evidence of underlying liver injury. 2. No acute pulmonary findings and normal appearance of the heart and great vessels other than atherosclerotic disease. 3. No acute intra-abdominal/intrapelvic injury is identified. 4. Age advanced atherosclerotic calcifications involving the thoracic and abdominal aorta and branch vessels including the coronary arteries. Aortic Atherosclerosis (ICD10-I70.0). Electronically Signed   By: Marijo Sanes M.D.   On: 11/19/2019 13:07    Anti-infectives: Anti-infectives (From admission, onward)   None       Assessment/Plan 64 yo male s/p fall from standing with 3 rib fractures. Pain well-controlled this morning, on room air. - Multimodal pain control for rib fractures - Diet as tolerated - IS and aggressive pulmonary toilet - Ok for discharge home from a trauma standpoint. We will sign off at this time, please call with any further questions or concerns.   LOS: 1 day   Michaelle Birks, [facility] 11/20/19 10:29 AM

## 2019-11-20 NOTE — Discharge Summary (Signed)
Physician Discharge Summary  Spencer [name] BTD:[id number] DOB: 1955/03/12 DOA: 11/19/2019  PCP: Burnard Bunting, MD  Admit date: 11/19/2019 Discharge date: 11/20/2019  Admitted From: home Disposition:  home   Recommendations for Outpatient Follow-up:  1. F/u on fractures  Home Health:  none Discharge Condition:  stable   CODE STATUS:  Full code   Diet recommendation:  Diabetic heart healthy Consultations:  trauma  Procedures/Studies: .    Discharge Diagnoses:  Principal Problem:   Multiple fractures of ribs, right side, initial encounter for closed fracture due to a mechanical Fall Active Problems:   Coronary artery disease   Hypertension associated with diabetes ([place])   DM2 (diabetes mellitus, type 2) ([place])   Hyperlipidemia associated with type 2 diabetes mellitus ([facility])   Back pain, chronic  Brief Summary:  Spencer [name] is a 64 y.o. male with medical history significant for CAD s/p CABG and PCI with DES, insulin-dependent type 2 diabetes, HTN, HLD, PAD/CAS, chronic back pain s/p lumbar fusion with neuropathy and left foot drop, and obesity who presents to the ED for evaluation of posterior right chest wall and flank pain after a fall at the [facility].  Patient states he goes to the [facility] 3 times a week to exercise in the pool.  He went this morning and while changing into a swimming trunks he lost his balance and fell backwards hitting his right flank on the edge of the hard metal bench in the locker room.  He had significant pain immediately and trouble with deep inspiration.  He says he did not hit his head or lose consciousness.  He did not have any associated lightheadedness/dizziness, chest pain, palpitations, nausea, vomiting.  He denies any cough or hemoptysis.  He takes aspirin and Effient for history of prior drug-eluting stent.  He says he has chronic left foot drop since prior lumbar fusion.  He has a cane and walker at home but says he does not generally require these  for ambulation.  He says he is on tramadol and Robaxin chronically for his back pain  Hospital Course:  Fall and rib fractures- Right posterior lateral 10,11,12th rib fractures after injury from fall - today he feels his pain has improved and has only taking the Tramadol that the takes PRN at home- he would like to go home - he is ambulating to the bathroom without difficulty - he has a private caretaker at home and his wife is a Marine scientist who works from home so he has adequate support - have given him 15 tabs of Oxycodone in case his pain becomes severe - Incentive spirometry ordered- he understands the importance of this  CAD s/p CABG and PCI with DES: Chronic and stable.  - cont aspirin and Effient t   Insulin-dependent type 2 diabetes: Continue home Basaglar, Metformin and Jardiance    Hypertension: - continue lisinopril.  Hyperlipidemia: Intolerant to statins due to myalgia. Managed by cardiology with [facility].  PAD/carotid artery disease: Follows with cardiology, Dr. Domenick Gong. Has one-vessel runoff below the knees bilaterally via the peroneal artery with no other obstructive disease. Carotid ultrasound 08/11/2019 shows 40-59% right ICA stenosis and 60-79% left ICA stenosis.    Chronic back pain s/p lumbar fusion with neuropathy and left foot drop: Continue home tramadol and Robaxin.  Depression/anxiety: Continue sertraline.  Discharge Exam: Vitals:   11/20/19 0516 11/20/19 0729  BP: (!) 154/63 123/63  Pulse: 63 71  Resp: 17 20  Temp: 98 F (36.7 C) 98.2 F (  36.8 C)  SpO2: 99% 99%   Vitals:   11/19/19 1904 11/20/19 0042 11/20/19 0516 11/20/19 0729  BP: (!) 168/68 (!) 152/63 (!) 154/63 123/63  Pulse: 70 77 63 71  Resp: '18 16 17 20  ' Temp: 98.1 F (36.7 C) 98.4 F (36.9 C) 98 F (36.7 C) 98.2 F (36.8 C)  TempSrc: Oral Oral Oral Oral  SpO2: 98% 96% 99% 99%  Weight:  129.2 kg    Height:        General: Pt is alert, awake, not in acute  distress Cardiovascular: RRR, S1/S2 +, no rubs, no gallops Respiratory: CTA bilaterally, no wheezing, no rhonchi Abdominal: Soft, NT, ND, bowel sounds + Extremities: no edema, no cyanosis   Discharge Instructions  Discharge Instructions    Diet - low sodium heart healthy   Complete by: As directed    Diet Carb Modified   Complete by: As directed    Increase activity slowly   Complete by: As directed      Allergies as of 11/20/2019      Reactions   Victoza [liraglutide] Other (See Comments)   pancreatitis   Brilinta [ticagrelor]    Dyspnea   Codeine Other (See Comments)   hyperactive   Other Nausea And Vomiting   Mayonnaise causes nausea and vomiting   Statins    Muscle weakness & fatigue      Medication List    TAKE these medications   aspirin EC 81 MG tablet Take 81 mg by mouth daily.   Basaglar KwikPen 100 UNIT/ML Inject 32 Units into the skin daily.   carvedilol 6.25 MG tablet Commonly known as: COREG TAKE 1 TABLET BY MOUTH TWICE DAILY What changed: when to take this   cholecalciferol 1000 units tablet Commonly known as: VITAMIN D Take 1,000 Units by mouth daily.   Colace 100 MG capsule Generic drug: docusate sodium Take 100 mg by mouth 2 (two) times daily.   famotidine 20 MG tablet Commonly known as: PEPCID Take 20 mg by mouth 2 (two) times daily.   FREESTYLE LITE test strip Generic drug: glucose blood   HumaLOG KwikPen 100 UNIT/ML KwikPen Generic drug: insulin lispro Inject 4 Units into the skin See admin instructions. With meals as directed   icosapent Ethyl 1 g capsule Commonly known as: Vascepa Take 2 capsules (2 g total) by mouth 2 (two) times daily.   Jardiance 25 MG Tabs tablet Generic drug: empagliflozin Take 25 mg by mouth daily.   lisinopril 40 MG tablet Commonly known as: ZESTRIL Take 1 tablet (40 mg total) by mouth daily.   metFORMIN 500 MG 24 hr tablet Commonly known as: GLUCOPHAGE-XR Take 2 tablets (1,000 mg total) by  mouth 2 (two) times daily. 0530 & 1730 What changed: how much to take   methocarbamol 500 MG tablet Commonly known as: ROBAXIN Take 500 mg by mouth every 8 (eight) hours. scheduled   metroNIDAZOLE 1 % gel Commonly known as: METROGEL Apply 1 application topically daily as needed (rosacea).   MiraLax 17 GM/SCOOP powder Generic drug: polyethylene glycol powder Take 17 g by mouth daily as needed (constipation).   nitroGLYCERIN 0.4 MG SL tablet Commonly known as: NITROSTAT Place 1 tablet (0.4 mg total) under the tongue every 5 (five) minutes as needed for chest pain (CP or SOB).   oxycodone 5 MG capsule Commonly known as: OXY-IR Take 1 capsule (5 mg total) by mouth every 4 (four) hours as needed.   PenTips 31G X 5 MM Misc Generic  drug: Insulin Pen Needle   prasugrel 10 MG Tabs tablet Commonly known as: EFFIENT Take 1 tablet by mouth daily.   pregabalin 150 MG capsule Commonly known as: LYRICA Take 150 mg by mouth 2 (two) times daily. 0530 & 1730   PROBIOTIC PO Take 1 each by mouth daily. Keifer liquid yogurt probiotic   pyridOXINE 100 MG tablet Commonly known as: VITAMIN B-6 Take 100 mg by mouth daily.   Repatha SureClick 220 MG/ML Soaj Generic drug: Evolocumab INJECT 140 MG INTO THE SKIN EVERY 14 DAYS. What changed: See the new instructions.   senna-docusate 8.6-50 MG tablet Commonly known as: Senokot-S Take 1 tablet by mouth at bedtime as needed for mild constipation.   sertraline 50 MG tablet Commonly known as: ZOLOFT Take 50 mg by mouth 2 (two) times daily. 0530 & 1730   Sutab [phone] [phone] MG Tabs Generic drug: Sodium Sulfate-Mag Sulfate-KCl Take 1 kit by mouth as directed.   traMADol 50 MG tablet Commonly known as: ULTRAM Take 100 mg by mouth in the morning, at noon, and at bedtime. scheduled   vitamin C 1000 MG tablet Take 1,000 mg by mouth daily.   zolpidem 10 MG tablet Commonly known as: AMBIEN Take 5 mg by mouth at bedtime.       Allergies   Allergen Reactions  . Victoza [Liraglutide] Other (See Comments)    pancreatitis  . Brilinta [Ticagrelor]     Dyspnea  . Codeine Other (See Comments)    hyperactive  . Other Nausea And Vomiting    Mayonnaise causes nausea and vomiting  . Statins     Muscle weakness & fatigue      CT ABDOMEN PELVIS W CONTRAST  Result Date: 11/09/2019 CLINICAL DATA:  Left-sided abdominal pain EXAM: CT ABDOMEN AND PELVIS WITH CONTRAST TECHNIQUE: Multidetector CT imaging of the abdomen and pelvis was performed using the standard protocol following bolus administration of intravenous contrast. CONTRAST:  172m ISOVUE-300 IOPAMIDOL (ISOVUE-300) INJECTION 61% COMPARISON:  08/12/2015 FINDINGS: Lower chest: No acute abnormality. Hepatobiliary: No focal liver abnormality is seen. No gallstones, gallbladder wall thickening, or biliary dilatation. Pancreas: Unremarkable. No pancreatic ductal dilatation or surrounding inflammatory changes. Spleen: Normal in size without focal abnormality. Adrenals/Urinary Tract: Unremarkable adrenal glands. Symmetric enhancement of the bilateral kidneys. Numerous punctate 2-3 mm calculi within the bilateral kidneys. No hydronephrosis. Ureters are unremarkable. No ureteral calculi. Urinary bladder is within normal limits for the degree of distension. Stomach/Bowel: Stomach is within normal limits. Appendix not definitively visualized. Moderate volume of stool throughout the colon. No evidence of bowel wall thickening, distention, or inflammatory changes. Vascular/Lymphatic: Moderate degree of aortoiliac atherosclerotic calcifications without aneurysm. No abdominopelvic lymphadenopathy. Reproductive: Prostate is unremarkable. Other: No free fluid. No abdominopelvic fluid collection. No pneumoperitoneum. Prior umbilical hernia repair. No abdominal wall hernia. Musculoskeletal: Prior L2-S1 posterior and interbody fusion. No new or acute osseous findings. IMPRESSION: 1. No acute abdominopelvic  findings. 2. Bilateral nonobstructing nephrolithiasis. 3. Moderate volume of stool throughout the colon. 4. Aortic atherosclerosis. (ICD10-I70.0). Electronically Signed   By: NDavina PokeD.O.   On: 11/09/2019 10:19   CT CHEST ABDOMEN PELVIS W CONTRAST  Result Date: 11/19/2019 CLINICAL DATA:  F[employer]en Circlet[employer].  Trauma. EXAM: CT CHEST, ABDOMEN, AND PELVIS WITH CONTRAST TECHNIQUE: Multidetector CT imaging of the chest, abdomen and pelvis was performed following the standard protocol during bolus administration of intravenous contrast. CONTRAST:  1047mOMNIPAQUE IOHEXOL 300 MG/ML  SOLN COMPARISON:  CT scan 08/12/2015 FINDINGS: CT CHEST FINDINGS Cardiovascular: The heart is normal  in size. No pericardial effusion. 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 Age advanced three-vessel coronary artery calcifications. Scattered age advanced aortic calcifications. The aorta is normal in caliber. No focal aneurysm or dissection. 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 Mediastinum/Nodes: No mediastinal or hilar mass or adenopathy or hematoma. The esophagus is unremarkable. Lungs/Pleura: No acute pulmonary findings. No pulmonary contusion, infiltrate, mass, pleural effusion or pneumothorax. Musculoskeletal: There are fractures of the right tenth, eleventh and twelfth ribs. The eleventh rib fracture is minimally displaced. No underlying injury to the liver is identified. There is an overlying contusion/hematoma in the right upper flank area with small hematoma. CT ABDOMEN PELVIS FINDINGS Hepatobiliary: No hepatic lesions or acute hepatic injury. No perihepatic fluid collections. The gallbladder is unremarkable. No common bile duct dilatation. Pancreas: No mass, inflammation or ductal dilatation. No evidence of acute pancreatic injury or peripancreatic fluid collection. Spleen: Normal size. No acute injury or perisplenic fluid collection.  Adrenals/Urinary Tract: The adrenal glands are normal. No acute renal injury or renal mass. No hydronephrosis. The bladder is unremarkable. Stomach/Bowel: The stomach, duodenum, small bowel and colon are grossly normal without oral contrast. No acute inflammatory changes, mass lesions or obstructive findings. Vascular/Lymphatic: Age advanced atherosclerotic calcifications involving the aorta and branch vessels. No aneurysm or dissection. No mesenteric or retroperitoneal mass, adenopathy or hematoma. Reproductive: The prostate gland and seminal vesicles are unremarkable. Other: No pelvic mass or adenopathy. No free pelvic fluid collections. No inguinal mass or adenopathy. No abdominal wall hernia or subcutaneous lesions. Musculoskeletal: Extensive lumbosacral fusion hardware. No obvious complicating features. I do not see any significant solid interbody fusion changes at L5-S1. The bony pelvis is intact. The pubic symphysis and SI joints are intact. Moderate bilateral SI joint degenerative changes are noted along with mild to moderate bilateral hip joint degenerative changes. No acute pelvic fracture. IMPRESSION: 1. Right posterolateral tenth, eleventh and twelfth rib fractures. Overlying right upper flank contusion/hematoma. No pneumothorax or evidence of underlying liver injury. 2. No acute pulmonary findings and normal appearance of the heart and great vessels other than atherosclerotic disease. 3. No acute intra-abdominal/intrapelvic injury is identified. 4. Age advanced atherosclerotic calcifications involving the thoracic and abdominal aorta and branch vessels including the coronary arteries. Aortic Atherosclerosis (ICD10-I70.0). Electronically Signed   By: Marijo Sanes M.D.   On: 11/19/2019 13:07      The results of significant diagnostics from this hospitalization (including imaging, microbiology, ancillary and laboratory) are listed below for reference.     Microbiology: Recent Results (from the  past 240 hour(s))  Respiratory Panel by RT PCR (Flu A&B, Covid) - Nasopharyngeal Swab     Status: None   Collection Time: 11/19/19  4:05 PM   Specimen: Nasopharyngeal Swab  Result Value Ref Range Status   SARS Coronavirus 2 by RT PCR NEGATIVE NEGATIVE Final    Comment: (NOTE) SARS-CoV-2 target nucleic acids are NOT DETECTED.  The SARS-CoV-2 RNA is generally detectable in upper respiratoy specimens during the acute phase of infection. The lowest concentration of SARS-CoV-2 viral copies this assay can detect is 131 copies/mL. A negative result does not preclude SARS-Cov-2 infection and should not be used as the sole basis for treatment or other patient management decisions. A negative result may occur with  improper specimen collection/handling, submission of specimen other than nasopharyngeal swab, presence of viral mutation(s) within the areas targeted by this assay, and inadequate number of viral copies (<131 copies/mL). A negative  result must be combined with clinical observations, patient history, and epidemiological information. The expected result is Negative.  Fact Sheet for Patients:  PinkCheek.be  Fact Sheet for Healthcare Providers:  GravelBags.it  This test is no t yet approved or cleared by the [place] FDA and  has been authorized for detection and/or diagnosis of SARS-CoV-2 by FDA under an Emergency Use Authorization (EUA). This EUA will remain  in effect (meaning this test can be used) for the duration of the COVID-19 declaration under Section 564(b)(1) of the Act, 21 U.S.C. section 360bbb-3(b)(1), unless the authorization is terminated or revoked sooner.     Influenza A by PCR NEGATIVE NEGATIVE Final   Influenza B by PCR NEGATIVE NEGATIVE Final    Comment: (NOTE) The Xpert Xpress SARS-CoV-2/FLU/RSV assay is intended as an aid in  the diagnosis of influenza from Nasopharyngeal swab specimens and   should not be used as a sole basis for treatment. Nasal washings and  aspirates are unacceptable for Xpert Xpress SARS-CoV-2/FLU/RSV  testing.  Fact Sheet for Patients: PinkCheek.be  Fact Sheet for Healthcare Providers: GravelBags.it  This test is not yet approved or cleared by the [place] FDA and  has been authorized for detection and/or diagnosis of SARS-CoV-2 by  FDA under an Emergency Use Authorization (EUA). This EUA will remain  in effect (meaning this test can be used) for the duration of the  Covid-19 declaration under Section 564(b)(1) of the Act, 21  U.S.C. section 360bbb-3(b)(1), unless the authorization is  terminated or revoked. Performed at [facility], [address] [place], [place] [address]      Labs: BNP (last 3 results) No results for input(s): BNP in the last 8760 hours. Basic Metabolic Panel: Recent Labs  Lab 11/19/19 1124 11/20/19 0409  NA 131* 137  K 4.6 4.1  CL 95* 102  CO2 26 25  GLUCOSE 231* 200*  BUN 24* 19  CREATININE 0.86 0.90  CALCIUM 8.9 8.9   Liver Function Tests: Recent Labs  Lab 11/19/19 1124  AST 24  ALT 28  ALKPHOS 66  BILITOT 0.5  PROT 7.7  ALBUMIN 4.0   No results for input(s): LIPASE, AMYLASE in the last 168 hours. No results for input(s): AMMONIA in the last 168 hours. CBC: Recent Labs  Lab 11/19/19 1124  WBC 9.3  NEUTROABS 7.5  HGB 14.4  HCT 44.1  MCV 90.0  PLT 209   Cardiac Enzymes: No results for input(s): CKTOTAL, CKMB, CKMBINDEX, TROPONINI in the last 168 hours. BNP: Invalid input(s): POCBNP CBG: Recent Labs  Lab 11/19/19 1420 11/19/19 2157 11/20/19 0620  GLUCAP 170* 219* 167*   D-Dimer No results for input(s): DDIMER in the last 72 hours. Hgb A1c Recent Labs    11/20/19 0409  HGBA1C 9.0*   Lipid Profile No results for input(s): CHOL, HDL, LDLCALC, TRIG, CHOLHDL, LDLDIRECT in the last 72 hours. Thyroid function  studies No results for input(s): TSH, T4TOTAL, T3FREE, THYROIDAB in the last 72 hours.  Invalid input(s): FREET3 Anemia work up No results for input(s): VITAMINB12, FOLATE, FERRITIN, TIBC, IRON, RETICCTPCT in the last 72 hours. Urinalysis    Component Value Date/Time   COLORURINE YELLOW 11/19/2019 1107   APPEARANCEUR CLEAR 11/19/2019 1107   LABSPEC 1.010 11/19/2019 1107   PHURINE 6.5 11/19/2019 1107   GLUCOSEU >=500 (A) 11/19/2019 1107   HGBUR NEGATIVE 11/19/2019 1107   HGBUR negative 04/17/2008 1319   BILIRUBINUR NEGATIVE 11/19/2019 1107   [place] 11/19/2019 1107  PROTEINUR NEGATIVE 11/19/2019 1107   UROBILINOGEN 0.2 11/25/2010 1039   NITRITE NEGATIVE 11/19/2019 1107   LEUKOCYTESUR NEGATIVE 11/19/2019 1107   Sepsis Labs Invalid input(s): PROCALCITONIN,  WBC,  LACTICIDVEN Microbiology Recent Results (from the past 240 hour(s))  Respiratory Panel by RT PCR (Flu A&B, Covid) - Nasopharyngeal Swab     Status: None   Collection Time: 11/19/19  4:05 PM   Specimen: Nasopharyngeal Swab  Result Value Ref Range Status   SARS Coronavirus 2 by RT PCR NEGATIVE NEGATIVE Final    Comment: (NOTE) SARS-CoV-2 target nucleic acids are NOT DETECTED.  The SARS-CoV-2 RNA is generally detectable in upper respiratoy specimens during the acute phase of infection. The lowest concentration of SARS-CoV-2 viral copies this assay can detect is 131 copies/mL. A negative result does not preclude SARS-Cov-2 infection and should not be used as the sole basis for treatment or other patient management decisions. A negative result may occur with  improper specimen collection/handling, submission of specimen other than nasopharyngeal swab, presence of viral mutation(s) within the areas targeted by this assay, and inadequate number of viral copies (<131 copies/mL). A negative result must be combined with clinical observations, patient history, and epidemiological information. The expected result  is Negative.  Fact Sheet for Patients:  PinkCheek.be  Fact Sheet for Healthcare Providers:  GravelBags.it  This test is no t yet approved or cleared by the [place] FDA and  has been authorized for detection and/or diagnosis of SARS-CoV-2 by FDA under an Emergency Use Authorization (EUA). This EUA will remain  in effect (meaning this test can be used) for the duration of the COVID-19 declaration under Section 564(b)(1) of the Act, 21 U.S.C. section 360bbb-3(b)(1), unless the authorization is terminated or revoked sooner.     Influenza A by PCR NEGATIVE NEGATIVE Final   Influenza B by PCR NEGATIVE NEGATIVE Final    Comment: (NOTE) The Xpert Xpress SARS-CoV-2/FLU/RSV assay is intended as an aid in  the diagnosis of influenza from Nasopharyngeal swab specimens and  should not be used as a sole basis for treatment. Nasal washings and  aspirates are unacceptable for Xpert Xpress SARS-CoV-2/FLU/RSV  testing.  Fact Sheet for Patients: PinkCheek.be  Fact Sheet for Healthcare Providers: GravelBags.it  This test is not yet approved or cleared by the [place] FDA and  has been authorized for detection and/or diagnosis of SARS-CoV-2 by  FDA under an Emergency Use Authorization (EUA). This EUA will remain  in effect (meaning this test can be used) for the duration of the  Covid-19 declaration under Section 564(b)(1) of the Act, 21  U.S.C. section 360bbb-3(b)(1), unless the authorization is  terminated or revoked. Performed at [facility], [address] [place], [place] [address]      Time coordinating discharge in minutes: 65  SIGNED:   Debbe Odea, MD  [facility] 11/20/2019, 10:02 AM

## 2019-12-01 MED FILL — SUTAB 1479-225-188 MG TABS: 1479-225-18 | 1 days supply | Qty: 24 | Fill #0

## 2019-12-01 MED FILL — metFORMIN HCL ER 500 MG TB2: 500 | 90 days supply | Qty: 360 | Fill #1

## 2019-12-06 ENCOUNTER — Other Ambulatory Visit (HOSPITAL_BASED_OUTPATIENT_CLINIC_OR_DEPARTMENT_OTHER): Payer: Self-pay | Admitting: Internal Medicine

## 2019-12-06 MED FILL — JARDIANCE 25 MG TABLET: 25 | 90 days supply | Qty: 90 | Fill #1

## 2019-12-06 MED FILL — FAMOTIDINE 20 MG TABS: 20 | 90 days supply | Qty: 360 | Fill #1

## 2019-12-06 MED FILL — SERTRALINE HCL 50 MG TABLET: 50 | 90 days supply | Qty: 180 | Fill #0

## 2019-12-06 MED FILL — VASCEPA 1 GM CAPSULE: 1 | 90 days supply | Qty: 360 | Fill #1

## 2019-12-06 MED FILL — REPATHA SURECLICK 140 MG/ML: 140 | 28 days supply | Qty: 2 | Fill #10

## 2019-12-07 MED FILL — PREGABALIN 150 MG CAPS: 150 | 90 days supply | Qty: 180 | Fill #0

## 2019-12-09 ENCOUNTER — Other Ambulatory Visit (HOSPITAL_BASED_OUTPATIENT_CLINIC_OR_DEPARTMENT_OTHER): Payer: Self-pay | Admitting: Internal Medicine

## 2019-12-09 MED FILL — ZOLPIDEM TARTRATE 10 MG TAB: 10 | 90 days supply | Qty: 90 | Fill #1

## 2019-12-28 MED FILL — PRASUGREL HCL 10 MG TABS: 10 | 90 days supply | Qty: 90 | Fill #2

## 2020-01-05 MED FILL — PENTIPS 31G X 5 MM MISC: 31G X 5 MM | 75 days supply | Qty: 300 | Fill #1

## 2020-01-05 MED FILL — REPATHA SURECLICK 140 MG/ML: 140 | 28 days supply | Qty: 2 | Fill #11

## 2020-01-11 ENCOUNTER — Other Ambulatory Visit (HOSPITAL_BASED_OUTPATIENT_CLINIC_OR_DEPARTMENT_OTHER): Payer: Self-pay | Admitting: Pharmacotherapy

## 2020-01-11 DIAGNOSIS — I10 Essential (primary) hypertension: Secondary | ICD-10-CM | POA: Diagnosis not present

## 2020-01-11 DIAGNOSIS — E1140 Type 2 diabetes mellitus with diabetic neuropathy, unspecified: Secondary | ICD-10-CM | POA: Diagnosis not present

## 2020-01-11 DIAGNOSIS — Z794 Long term (current) use of insulin: Secondary | ICD-10-CM | POA: Diagnosis not present

## 2020-01-11 MED FILL — FREESTYLE FREEDOM LITE METE: W/DEVICE | 30 days supply | Qty: 1 | Fill #0

## 2020-01-18 ENCOUNTER — Telehealth: Payer: Self-pay

## 2020-01-18 ENCOUNTER — Telehealth: Payer: Self-pay | Admitting: Physician Assistant

## 2020-01-18 NOTE — Telephone Encounter (Signed)
Pt's spouse is requesting a call back from a nurse to discuss the pt's withholding of Effient (blood thinner) pt's colonoscopy is scheduled for 12/2.

## 2020-01-18 NOTE — Telephone Encounter (Signed)
Dr. Fletcher Anon  Spencer [name] with Carotid disease and CAD with CABG and then stents to VG to OM and VG to RCA in 2015.  Needs colonoscopy can he hold effient for 5 days for procedure?  Thanks.

## 2020-01-18 NOTE — Telephone Encounter (Signed)
[facility] Pre-operative Risk Assessment     Request for surgical clearance:     Endoscopy Procedure  What type of surgery is being performed?     Colonoscopy  When is this surgery scheduled?     01/20/20  What type of clearance is required ?   Pharmacy  Are there any medications that need to be held prior to surgery and how long? Effient  Practice name and name of physician performing surgery?      [facility]  What is your office phone and fax number?      Phone- [phone]  [phone]  Anesthesia type (None, local, MAC, general) ?       MAC

## 2020-01-18 NOTE — Telephone Encounter (Signed)
Called pt to remind them of appt and spoke pt's wife.  She is wanting to know about stopping effient which pt took today. Please call if pt needs to hold and/or we need to reschedule.  Please advise.

## 2020-01-18 NOTE — Telephone Encounter (Signed)
Estill Bamberg is calling requesting an answer for this clearance.

## 2020-01-19 NOTE — Telephone Encounter (Signed)
Pt is medically cleared - can complete more than 4.0 METS.   Procedure has been rescheduled for 12/16.

## 2020-01-19 NOTE — Telephone Encounter (Signed)
Hold Effient 7 days before.  Continue aspirin 81 mg daily.

## 2020-01-19 NOTE — Telephone Encounter (Signed)
Spoke with patient's wife yesterday afternoon. I had advised that I had been waiting for clearance from Cardiology. We decided that we would push back his procedure to get proper clearance. Patient and his wife agreed with plan.

## 2020-01-20 ENCOUNTER — Encounter: Payer: 59 | Admitting: Internal Medicine

## 2020-01-20 ENCOUNTER — Telehealth: Payer: Self-pay

## 2020-01-20 NOTE — Telephone Encounter (Signed)
   Primary Cardiologist: Kathlyn Sacramento, MD  Chart reviewed as part of pre-operative protocol coverage. Patient was contacted 01/20/2020 in reference to pre-operative risk assessment for pending surgery as outlined below.  Spencer [name] was last seen on 02/23/19 by Dr. Fletcher Anon.  Since that day, Spencer [name] has done well. He can complete more than 4.0 METS without angina.   Per Dr. Fletcher Anon: Hold Effient 7 days before.  Continue aspirin 81 mg daily.  Therefore, based on ACC/AHA guidelines, the patient would be at acceptable risk for the planned procedure without further cardiovascular testing.   The patient was advised that if he develops new symptoms prior to surgery to contact our office to arrange for a follow-up visit, and he verbalized understanding.  I will route this recommendation to the requesting party via Epic fax function and remove from pre-op pool. Please call with questions.  Tami Lin Kerrianne Jeng, PA 01/20/2020, 11:46 AM

## 2020-01-20 NOTE — Telephone Encounter (Signed)
Spoke with Spencer [name] to advise to hold Effient for 7 Days prior to procedure and to continue Asprin.

## 2020-01-24 MED FILL — BASAGLAR 100 UNIT/ML KWIKPE: 100 | 89 days supply | Qty: 24 | Fill #2

## 2020-01-27 ENCOUNTER — Other Ambulatory Visit: Payer: Self-pay | Admitting: Cardiovascular Disease

## 2020-01-27 DIAGNOSIS — E785 Hyperlipidemia, unspecified: Secondary | ICD-10-CM

## 2020-01-27 MED FILL — REPATHA SURECLICK 140 MG/ML: 140 | 30 days supply | Qty: 2 | Fill #0

## 2020-01-28 MED FILL — LISINOPRIL 40 MG TABS: 40 | 90 days supply | Qty: 90 | Fill #2

## 2020-01-28 MED FILL — CARVEDILOL 6.25 MG TABLET: 6.25 | 90 days supply | Qty: 180 | Fill #1

## 2020-01-31 ENCOUNTER — Other Ambulatory Visit: Payer: Self-pay

## 2020-01-31 ENCOUNTER — Ambulatory Visit (AMBULATORY_SURGERY_CENTER): Payer: 59 | Admitting: Internal Medicine

## 2020-01-31 ENCOUNTER — Encounter: Payer: Self-pay | Admitting: Internal Medicine

## 2020-01-31 VITALS — BP 144/64 | HR 74 | Temp 98.4°F | Resp 15 | Ht 73.0 in | Wt 289.0 lb

## 2020-01-31 DIAGNOSIS — D123 Benign neoplasm of transverse colon: Secondary | ICD-10-CM

## 2020-01-31 DIAGNOSIS — R1084 Generalized abdominal pain: Secondary | ICD-10-CM

## 2020-01-31 DIAGNOSIS — R109 Unspecified abdominal pain: Secondary | ICD-10-CM | POA: Diagnosis not present

## 2020-01-31 DIAGNOSIS — K5900 Constipation, unspecified: Secondary | ICD-10-CM

## 2020-01-31 DIAGNOSIS — R1032 Left lower quadrant pain: Secondary | ICD-10-CM

## 2020-01-31 DIAGNOSIS — Z1211 Encounter for screening for malignant neoplasm of colon: Secondary | ICD-10-CM | POA: Diagnosis not present

## 2020-01-31 MED ORDER — SODIUM CHLORIDE 0.9 % IV SOLN: 500.0000 mL | Freq: Once | INTRAVENOUS | Status: DC

## 2020-01-31 NOTE — Progress Notes (Signed)
Called to room to assist during endoscopic procedure.  Patient ID and intended procedure confirmed with present staff. Received instructions for my participation in the procedure from the performing physician.  

## 2020-01-31 NOTE — Progress Notes (Signed)
Medical history reviewed with no changes noted. VS assessed by C.W 

## 2020-01-31 NOTE — Progress Notes (Signed)
Lidocaine buffer  robinol antisialogogue A and O x3. Report to RN. Tolerated MAC anesthesia well.Teeth unchanged after procedure.

## 2020-01-31 NOTE — Op Note (Signed)
[name] Patient Name: Spencer [name] Procedure Date: 01/31/2020 8:43 AM MRN: [id number] Endoscopist: Docia Chuck. Henrene Pastor , MD Age: 64 Referring MD:  Date of Birth: [birth year] Gender: Male Account #: [id number] Procedure:                Upper GI endoscopy Indications:              Abdominal pain in the left lower quadrant Medicines:                Monitored Anesthesia Care Procedure:                Pre-Anesthesia Assessment:                           - Prior to the procedure, a History and Physical                            was performed, and patient medications and                            allergies were reviewed. The patient's tolerance of                            previous anesthesia was also reviewed. The risks                            and benefits of the procedure and the sedation                            options and risks were discussed with the patient.                            All questions were answered, and informed consent                            was obtained. Prior Anticoagulants: The patient has                            taken Effient (prasugrel), last dose was 6 days                            prior to procedure. ASA Grade Assessment: III - A                            patient with severe systemic disease. After                            reviewing the risks and benefits, the patient was                            deemed in satisfactory condition to undergo the                            procedure.  After obtaining informed consent, the endoscope was                            passed under direct vision. Throughout the                            procedure, the patient's blood pressure, pulse, and                            oxygen saturations were monitored continuously. The                            Endoscope was introduced through the mouth, and                            advanced to the second part of duodenum. The upper                             GI endoscopy was accomplished without difficulty.                            The patient tolerated the procedure well. Scope In: Scope Out: Findings:                 The esophagus was normal.                           The stomach was normal.                           The examined duodenum was normal.                           The cardia and gastric fundus were normal on                            retroflexion. Complications:            No immediate complications. Estimated Blood Loss:     Estimated blood loss: none. Impression:               - Normal esophagus.                           - Normal stomach.                           - Normal examined duodenum.                           - No specimens collected. Recommendation:           - Patient has a contact number available for                            emergencies. The signs and symptoms of potential  delayed complications were discussed with the                            patient. Return to normal activities tomorrow.                            Written discharge instructions were provided to the                            patient.                           - Resume previous diet.                           - Continue present medications.                           - Resume Effient (prasugrel) at prior dose today. Docia Chuck. Henrene Pastor, MD 01/31/2020 9:48:44 AM This report has been signed electronically.

## 2020-01-31 NOTE — Op Note (Signed)
[facility] Patient Name: Spencer [name] Procedure Date: 01/31/2020 8:43 AM MRN: [id number] Endoscopist: Docia Chuck. Henrene Pastor , MD Age: 64 Referring MD:  Date of Birth: [birth year] Gender: Male Account #: [id number] Procedure:                Colonoscopy with cold snare polypectomy x 1 Indications:              Abdominal pain in the left lower quadrant. Colon                            cancer screening. Previous examination 2014 with                            fair prep and redundant colon. Nonvisualization of                            the cecal base Medicines:                Monitored Anesthesia Care Procedure:                Pre-Anesthesia Assessment:                           - Prior to the procedure, a History and Physical                            was performed, and patient medications and                            allergies were reviewed. The patient's tolerance of                            previous anesthesia was also reviewed. The risks                            and benefits of the procedure and the sedation                            options and risks were discussed with the patient.                            All questions were answered, and informed consent                            was obtained. Prior Anticoagulants: The patient has                            taken Effient (prasugrel), last dose was 6 days                            prior to procedure. ASA Grade Assessment: III - A                            patient with severe systemic disease. After  reviewing the risks and benefits, the patient was                            deemed in satisfactory condition to undergo the                            procedure.                           After obtaining informed consent, the colonoscope                            was passed under direct vision. Throughout the                            procedure, the patient's blood pressure, pulse, and                             oxygen saturations were monitored continuously. The                            Colonoscope was introduced through the anus and                            advanced to the the region of the hepatic flexure.                            The rectum was photographed. The quality of the                            bowel preparation was adequate to identify polyps.                            The colonoscopy was performed with significant                            difficulty due to dilated redundant colon and body                            habitus.. The patient tolerated the procedure well.                            The bowel preparation used was SUPREP via split                            dose instruction. Scope In: 9:19:11 AM Scope Out: 9:35:05 AM Total Procedure Duration: 0 hours 15 minutes 54 seconds  Findings:                 A 3 mm polyp was found in the transverse colon. The                            polyp was removed with a cold snare. Resection and  retrieval were complete.                           Examination could not be carried out to the level                            of the hepatic flexure, or slightly beyond, due to                            dilated redundant colon and body habitus. The exam                            was otherwise without abnormality on direct and                            retroflexion views. Complications:            No immediate complications. Estimated blood loss:                            None. Estimated Blood Loss:     Estimated blood loss: none. Impression:               - One 3 mm polyp in the transverse colon, removed                            with a cold snare. Resected and retrieved.                           - The examined portion of the colon to the region                            of the hepatic flexure was otherwise normal on                            direct and retroflexion  views. Recommendation:           - Repeat colonoscopy in 10 years for screening                            purposes.                           - Resume Effient (prasugrel) today at prior dose.                           - Patient has a contact number available for                            emergencies. The signs and symptoms of potential                            delayed complications were discussed with the  patient. Return to normal activities tomorrow.                            Written discharge instructions were provided to the                            patient.                           - Resume previous diet.                           - Continue present medications.                           - Schedule air-contrast barium enema. "Incomplete                            screening colonoscopy. Rule out proximal colonic                            lesions"                           NOTE: Chronic left-sided/lateral pain is                            musculoskeletal in nature as evidenced on physical                            examination preprocedure [name] N. Henrene Pastor, MD 01/31/2020 9:46:31 AM This report has been signed electronically.

## 2020-01-31 NOTE — Patient Instructions (Signed)
YOU HAD AN ENDOSCOPIC PROCEDURE TODAY AT [facility]:   Refer to the procedure report that was given to you for any specific questions about what was found during the examination.  If the procedure report does not answer your questions, please call your gastroenterologist to clarify.  If you requested that your care partner not be given the details of your procedure findings, then the procedure report has been included in a sealed envelope for you to review at your convenience later.  YOU SHOULD EXPECT: Some feelings of bloating in the abdomen. Passage of more gas than usual.  Walking can help get rid of the air that was put into your GI tract during the procedure and reduce the bloating. If you had a lower endoscopy (such as a colonoscopy or flexible sigmoidoscopy) you may notice spotting of blood in your stool or on the toilet paper. If you underwent a bowel prep for your procedure, you may not have a normal bowel movement for a few days.  Please Note:  You might notice some irritation and congestion in your nose or some drainage.  This is from the oxygen used during your procedure.  There is no need for concern and it should clear up in a day or so.  SYMPTOMS TO REPORT IMMEDIATELY:   Following lower endoscopy (colonoscopy or flexible sigmoidoscopy):  Excessive amounts of blood in the stool  Significant tenderness or worsening of abdominal pains  Swelling of the abdomen that is new, acute  Fever of 100F or higher   Following upper endoscopy (EGD)  Vomiting of blood or coffee ground material  New chest pain or pain under the shoulder blades  Painful or persistently difficult swallowing  New shortness of breath  Fever of 100F or higher  Black, tarry-looking stools  For urgent or emergent issues, a gastroenterologist can be reached at any hour by calling [phone]. Do not use MyChart messaging for urgent concerns.    DIET:  We do recommend a small meal at first, but  then you may proceed to your regular diet.  Drink plenty of fluids but you should avoid alcoholic beverages for 24 hours.  ACTIVITY:  You should plan to take it easy for the rest of today and you should NOT DRIVE or use heavy machinery until tomorrow (because of the sedation medicines used during the test).    FOLLOW UP: Our staff will call the number listed on your records 48-72 hours following your procedure to check on you and address any questions or concerns that you may have regarding the information given to you following your procedure. If we do not reach you, we will leave a message.  We will attempt to reach you two times.  During this call, we will ask if you have developed any symptoms of COVID 19. If you develop any symptoms (ie: fever, flu-like symptoms, shortness of breath, cough etc.) before then, please call [phone].  If you test positive for Covid 19 in the 2 weeks post procedure, please call and report this information to [place].    If any biopsies were taken you will be contacted by phone or by letter within the next 1-3 weeks.  Please call us at [phone] if you have not heard about the biopsies in 3 weeks.    SIGNATURES/CONFIDENTIALITY: You and/or your care partner have signed paperwork which will be entered into your electronic medical record.  These signatures attest to the fact that that the information above on  your After Visit Summary has been reviewed and is understood.  Full responsibility of the confidentiality of this discharge information lies with you and/or your care-partner.

## 2020-01-31 NOTE — Progress Notes (Signed)
Patient needs to be scheduled for a air-contrast barium enema for incomplete colonoscopy.

## 2020-02-02 ENCOUNTER — Telehealth: Payer: Self-pay

## 2020-02-02 ENCOUNTER — Other Ambulatory Visit: Payer: Self-pay

## 2020-02-02 DIAGNOSIS — Z1211 Encounter for screening for malignant neoplasm of colon: Secondary | ICD-10-CM

## 2020-02-02 NOTE — Telephone Encounter (Signed)
  Follow up Call-  Call back number 01/31/2020  Post procedure Call Back phone  # [phone]  Permission to leave phone message Yes  Some recent data might be hidden     Patient questions:  Do you have a fever, pain , or abdominal swelling? No. Pain Score  0 *  Have you tolerated food without any problems? Yes.    Have you been able to return to your normal activities? Yes.    Do you have any questions about your discharge instructions: Diet   No. Medications  No. Follow up visit  No.  Do you have questions or concerns about your Care? No.  Actions: * If pain score is 4 or above: No action needed, pain <4.  1. Have you developed a fever since your procedure? no  2.   Have you had an respiratory symptoms (SOB or cough) since your procedure? no  3.   Have you tested positive for COVID 19 since your procedure no  4.   Have you had any family members/close contacts diagnosed with the COVID 19 since your procedure?  no   If yes to any of these questions please route to Joylene John, RN and Joella Prince, RN

## 2020-02-02 NOTE — Telephone Encounter (Signed)
Pt scheduled for barium enema at [facility] 02/16/20@9 :30am. Pt to arrive there at 9:15am. Pt aware of appt and requested prep instructions be mailed to him. Instructions put in mail.

## 2020-02-15 MED FILL — METHOCARBAMOL 500 MG TABS: 500 | 90 days supply | Qty: 270 | Fill #0

## 2020-02-15 MED FILL — traMADol HCL 50 MG TABS: 50 | 90 days supply | Qty: 720 | Fill #1

## 2020-02-16 ENCOUNTER — Ambulatory Visit (HOSPITAL_COMMUNITY)
Admission: RE | Admit: 2020-02-16 | Discharge: 2020-02-16 | Disposition: A | Discharge status: Home / Self Care | Payer: 59 | Source: Ambulatory Visit | Attending: Internal Medicine | Admitting: Internal Medicine

## 2020-02-16 ENCOUNTER — Other Ambulatory Visit: Payer: Self-pay

## 2020-02-16 DIAGNOSIS — Z1211 Encounter for screening for malignant neoplasm of colon: Secondary | ICD-10-CM | POA: Insufficient documentation

## 2020-02-16 DIAGNOSIS — Q438 Other specified congenital malformations of intestine: Secondary | ICD-10-CM | POA: Diagnosis not present

## 2020-02-22 ENCOUNTER — Ambulatory Visit: Payer: 59 | Admitting: Cardiovascular Disease

## 2020-03-06 MED FILL — PREGABALIN 150 MG CAPS: 150 | 90 days supply | Qty: 180 | Fill #1

## 2020-03-06 MED FILL — SERTRALINE HCL 50 MG TABLET: 50 | 90 days supply | Qty: 180 | Fill #1

## 2020-03-06 MED FILL — REPATHA SURECLICK 140 MG/ML: 140 | 30 days supply | Qty: 2 | Fill #1

## 2020-03-06 MED FILL — FAMOTIDINE 20 MG TABS: 20 | 90 days supply | Qty: 360 | Fill #2

## 2020-03-06 MED FILL — JARDIANCE 25 MG TABLET: 25 | 90 days supply | Qty: 90 | Fill #2

## 2020-03-06 MED FILL — VASCEPA 1 GM CAPSULE: 1 | 90 days supply | Qty: 360 | Fill #2

## 2020-03-06 MED FILL — metFORMIN HCL ER 500 MG TB2: 500 | 90 days supply | Qty: 360 | Fill #2

## 2020-03-07 ENCOUNTER — Encounter (HOSPITAL_COMMUNITY): Payer: Self-pay

## 2020-03-07 ENCOUNTER — Other Ambulatory Visit: Payer: Self-pay

## 2020-03-07 ENCOUNTER — Emergency Department (HOSPITAL_COMMUNITY)
Admission: EM | Admit: 2020-03-07 | Discharge: 2020-03-08 | Disposition: A | Discharge status: Home / Self Care | Payer: 59 | Attending: Emergency Medicine | Admitting: Emergency Medicine

## 2020-03-07 ENCOUNTER — Ambulatory Visit: Payer: 59 | Admitting: Cardiovascular Disease

## 2020-03-07 ENCOUNTER — Emergency Department (HOSPITAL_COMMUNITY): Payer: 59

## 2020-03-07 ENCOUNTER — Ambulatory Visit (HOSPITAL_COMMUNITY)
Admission: RE | Admit: 2020-03-07 | Payer: 59 | Source: Ambulatory Visit | Attending: Cardiovascular Disease | Admitting: Cardiovascular Disease

## 2020-03-07 DIAGNOSIS — W000XXA Fall on same level due to ice and snow, initial encounter: Secondary | ICD-10-CM | POA: Diagnosis not present

## 2020-03-07 DIAGNOSIS — Z794 Long term (current) use of insulin: Secondary | ICD-10-CM | POA: Diagnosis not present

## 2020-03-07 DIAGNOSIS — I2510 Atherosclerotic heart disease of native coronary artery without angina pectoris: Secondary | ICD-10-CM | POA: Insufficient documentation

## 2020-03-07 DIAGNOSIS — Z7982 Long term (current) use of aspirin: Secondary | ICD-10-CM | POA: Insufficient documentation

## 2020-03-07 DIAGNOSIS — M533 Sacrococcygeal disorders, not elsewhere classified: Secondary | ICD-10-CM | POA: Diagnosis not present

## 2020-03-07 DIAGNOSIS — E119 Type 2 diabetes mellitus without complications: Secondary | ICD-10-CM | POA: Diagnosis not present

## 2020-03-07 DIAGNOSIS — S3992XA Unspecified injury of lower back, initial encounter: Secondary | ICD-10-CM | POA: Diagnosis present

## 2020-03-07 DIAGNOSIS — Z955 Presence of coronary angioplasty implant and graft: Secondary | ICD-10-CM | POA: Insufficient documentation

## 2020-03-07 DIAGNOSIS — I10 Essential (primary) hypertension: Secondary | ICD-10-CM | POA: Diagnosis not present

## 2020-03-07 DIAGNOSIS — M1612 Unilateral primary osteoarthritis, left hip: Secondary | ICD-10-CM | POA: Diagnosis not present

## 2020-03-07 DIAGNOSIS — Z7984 Long term (current) use of oral hypoglycemic drugs: Secondary | ICD-10-CM | POA: Insufficient documentation

## 2020-03-07 DIAGNOSIS — W19XXXA Unspecified fall, initial encounter: Secondary | ICD-10-CM

## 2020-03-07 DIAGNOSIS — M545 Low back pain, unspecified: Secondary | ICD-10-CM | POA: Diagnosis not present

## 2020-03-07 DIAGNOSIS — Z85038 Personal history of other malignant neoplasm of large intestine: Secondary | ICD-10-CM | POA: Insufficient documentation

## 2020-03-07 DIAGNOSIS — S39012A Strain of muscle, fascia and tendon of lower back, initial encounter: Secondary | ICD-10-CM | POA: Diagnosis not present

## 2020-03-07 DIAGNOSIS — Y92009 Unspecified place in unspecified non-institutional (private) residence as the place of occurrence of the external cause: Secondary | ICD-10-CM | POA: Diagnosis not present

## 2020-03-07 DIAGNOSIS — Z79899 Other long term (current) drug therapy: Secondary | ICD-10-CM | POA: Diagnosis not present

## 2020-03-07 DIAGNOSIS — T148XXA Other injury of unspecified body region, initial encounter: Secondary | ICD-10-CM

## 2020-03-07 DIAGNOSIS — Z951 Presence of aortocoronary bypass graft: Secondary | ICD-10-CM | POA: Insufficient documentation

## 2020-03-07 MED ORDER — CARVEDILOL 3.125 MG PO TABS
6.2500 mg | ORAL_TABLET | Freq: Once | ORAL | Status: AC
  Administered 2020-03-07: 6.25 mg via ORAL
  Filled 2020-03-07: qty 2

## 2020-03-07 NOTE — ED Provider Notes (Signed)
[facility] DEPT Provider Note   CSN: [id number] Arrival date & time: 03/07/20  5784     History Chief Complaint  Patient presents with  . Fall  . Back Pain    Spencer [name] is a 65 y.o. male with PMHx HTN, HLD, GERD, Diabetes, Chronic back pain s/p fusion L2-S2 who presents to the ED today with complaint of sudden onset, constant, achy, diffuse lower back pain s/p mechanical fall that occurred earlier today. Pt reports he went outside for the first time since the snow storm today when he slipped on ice prompting him to fall forwards onto his abdomen/left hip area. Pt reports when he fell he felt immediate pain in his lower back and was concerned he could have damaged the hardware in his spine. He has to lay on the ground for approximately 20 minutes before being able to stand up on his own and walk to the house. He was able to ambulate onto his hip without difficulty. No head injury or LOC. No other complaints at this time.   The history is provided by the patient and medical records.       Past Medical History:  Diagnosis Date  . Anxiety attack   . Arthritis    "back; shoulders; knees" (07/23/2013)  . Back pain, chronic    "all over" (07/23/2013)  . Benign neoplasm of colon 04/01/2000   Hyperplastic  . Coronary artery disease    a. NSTEMI 10/12: s/p CABG with L-LAD, S-OM1/OM2, S-RV marg/RCA;  b. 07/2013 Cath/PCI: LM <10, LAD 70-80p, 60m, D1 90, RI 30ost, LCX 40-50p, 82m, OM1 90, OM2 100, RCA 100p, VG->OM1->OM2 patent with 90 @ anast (2.5x12 Xience Alpine DES), RCA 100p, LIMA->LAD nl, VG->Diag nl, VG->RV marginal/RCA 90p (3.0x23 Xience Alpine DES);  c. 07/2013 Echo: EF 55-60%.  . Depression    "pain related"  . DM2 (diabetes mellitus, type 2) ([place])   . GERD (gastroesophageal reflux disease)   . Hyperlipidemia   . Hypertension   . IBS (irritable bowel syndrome)   . Knee pain, bilateral    a. since 01/2013.  . Morbidly obese ([place])   . Myocardial  infarction ([place]) 2012  . Obesity   . Pleural effusion, left 11/2010   Postop; resolved with p.o. diuresis    Patient Active Problem List   Diagnosis Date Noted  . Fall 11/19/2019  . Multiple fractures of ribs, right side, initial encounter for closed fracture 11/19/2019  . Hyperlipidemia associated with type 2 diabetes mellitus ([place])   . Back pain, chronic   . Unstable angina ([place]) 07/23/2013  . Angina at rest [facility]) 07/22/2013  . Crescendo angina ([place]) 07/22/2013  . Right carotid bruit 05/26/2012  . Morbid obesity ([place]) 04/28/2012  . Hx of colonic polyps 04/28/2012  . Shortness of breath 12/11/2010  . Coronary artery disease   . Hypertension associated with diabetes ([place])   . Hyperlipidemia   . DM2 (diabetes mellitus, type 2) ([place])   . CONTACT DERMATITIS 04/17/2008    Past Surgical History:  Procedure Laterality Date  . ABDOMINAL AORTOGRAM W/LOWER EXTREMITY N/A 07/30/2017   Procedure: ABDOMINAL AORTOGRAM W/LOWER EXTREMITY;  Surgeon: Wellington Hampshire, MD;  Location: [facility] CV LAB;  Service: Cardiovascular;  Laterality: N/A;  . BACK SURGERY    . CARDIAC CATHETERIZATION  11/2010  . CARPAL TUNNEL RELEASE Bilateral   . CORONARY ANGIOPLASTY WITH STENT PLACEMENT  07/23/2013   "2"  . CORONARY ARTERY BYPASS GRAFT  11/26/10   x  [place] PETER VAN TRIGT  . HERNIA REPAIR     UHR  . LEFT HEART CATHETERIZATION WITH CORONARY/GRAFT ANGIOGRAM N/A 07/23/2013   Procedure: LEFT HEART CATHETERIZATION WITH Beatrix Fetters;  Surgeon: Peter M Martinique, MD;  Location: [facility] CATH LAB;  Service: Cardiovascular;  Laterality: N/A;  . LUMBAR FUSION     MULTIPLE; DR. Ellene Route  . SHOULDER ARTHROSCOPY W/ ROTATOR CUFF REPAIR Bilateral   . UMBILICAL HERNIA REPAIR         Family History  Problem Relation Age of Onset  . Hypertension Neg Hx   . Heart disease Neg Hx   . Peripheral vascular disease Neg Hx   . Diabetes Neg Hx   . Colon cancer Neg Hx   . Pancreatic cancer Neg Hx   . Prostate  cancer Neg Hx   . Rectal cancer Neg Hx     Social History   Tobacco Use  . Smoking status: Never Smoker  . Smokeless tobacco: Never Used  Vaping Use  . Vaping Use: Never used  Substance Use Topics  . Alcohol use: No  . Drug use: No    Home Medications Prior to Admission medications   Medication Sig Start Date End Date Taking? Authorizing Provider  oxyCODONE-acetaminophen (PERCOCET/ROXICET) 5-325 MG tablet Take 1 tablet by mouth every 6 (six) hours as needed for severe pain. 03/08/20  Yes Varnell Orvis, PA-C  Ascorbic Acid (VITAMIN C) 1000 MG tablet Take 1,000 mg by mouth daily.    [provider]  aspirin EC 81 MG tablet Take 81 mg by mouth daily.    [provider]  carvedilol (COREG) 6.25 MG tablet TAKE 1 TABLET BY MOUTH TWICE DAILY Patient taking differently: Take 6.25 mg by mouth in the morning and at bedtime. 10/22/19   Wellington Hampshire, MD  cholecalciferol (VITAMIN D) 1000 UNITS tablet Take 1,000 Units by mouth daily.    [provider]  docusate sodium (COLACE) 100 MG capsule Take 100 mg by mouth 2 (two) times daily.    [provider]  famotidine (PEPCID) 20 MG tablet Take 20 mg by mouth 2 (two) times daily. 06/14/19   [provider]  FREESTYLE LITE test strip  05/05/19   [provider]  HUMALOG KWIKPEN 100 UNIT/ML KwikPen Inject 4 Units into the skin See admin instructions. With meals as directed 06/15/19   [provider]  Icosapent Ethyl (VASCEPA) 1 g CAPS Take 2 capsules (2 g total) by mouth 2 (two) times daily. 09/16/17   Wellington Hampshire, MD  Insulin Glargine (BASAGLAR KWIKPEN) 100 UNIT/ML Inject 32 Units into the skin daily.    [provider]  JARDIANCE 25 MG TABS tablet Take 25 mg by mouth daily.  07/08/17   [provider]  lisinopril (PRINIVIL,ZESTRIL) 40 MG tablet Take 1 tablet (40 mg total) by mouth daily. 07/24/13   Theora Gianotti, NP  metFORMIN (GLUCOPHAGE-XR) 500 MG 24 hr  tablet Take 2 tablets (1,000 mg total) by mouth 2 (two) times daily. 0530 & 1730 11/20/19   Debbe Odea, MD  methocarbamol (ROBAXIN) 500 MG tablet Take 500 mg by mouth every 8 (eight) hours. scheduled    [provider]  metroNIDAZOLE (METROGEL) 1 % gel Apply 1 application topically daily as needed (rosacea).    [provider]  nitroGLYCERIN (NITROSTAT) 0.4 MG SL tablet Place 1 tablet (0.4 mg total) under the tongue every 5 (five) minutes as needed for chest pain (CP or SOB). Patient not taking:  Reported on 01/31/2020 07/24/13   Theora Gianotti, NP  oxycodone (OXY-IR) 5 MG capsule Take 1 capsule (5 mg total) by mouth every 4 (four) hours as needed. Patient not taking: Reported on 01/31/2020 11/20/19   Debbe Odea, MD  PENTIPS 31G X 5 MM MISC  05/05/19   [provider]  polyethylene glycol powder (GLYCOLAX/MIRALAX) 17 GM/SCOOP powder Take 17 g by mouth daily as needed (constipation).  Patient not taking: Reported on 01/31/2020    [provider]  prasugrel (EFFIENT) 10 MG TABS tablet Take 1 tablet by mouth daily. 10/21/17   [provider]  pregabalin (LYRICA) 150 MG capsule Take 150 mg by mouth 2 (two) times daily. 0530 & 1730    [provider]  Probiotic Product (PROBIOTIC PO) Take 1 each by mouth daily. Keifer liquid yogurt probiotic    [provider]  pyridOXINE (VITAMIN B-6) 100 MG tablet Take 100 mg by mouth daily.    [provider]  REPATHA SURECLICK 295 MG/ML SOAJ INJECT 140 MG INTO THE SKIN EVERY 14 DAYS. 01/27/20   Wellington Hampshire, MD  senna-docusate (SENOKOT-S) 8.6-50 MG tablet Take 1 tablet by mouth at bedtime as needed for mild constipation. Patient not taking: Reported on 01/31/2020 11/20/19   Debbe Odea, MD  sertraline (ZOLOFT) 50 MG tablet Take 50 mg by mouth 2 (two) times daily. 0530 & 1730    [provider]  traMADol (ULTRAM) 50 MG tablet Take 100 mg by mouth in the morning, at noon,  and at bedtime. scheduled    [provider]  zolpidem (AMBIEN) 10 MG tablet Take 5 mg by mouth at bedtime.    [provider]    Allergies    Victoza [liraglutide], Brilinta [ticagrelor], Codeine, Other, and Statins  Review of Systems   Review of Systems  Constitutional: Negative for chills and fever.  Gastrointestinal: Negative for abdominal pain.  Musculoskeletal: Positive for arthralgias and back pain.  Skin: Negative for wound.  Neurological: Negative for syncope and headaches.  All other systems reviewed and are negative.   Physical Exam Updated Vital Signs BP (!) 215/95   Pulse 90   Temp 97.7 F (36.5 C) (Oral)   Resp 18   SpO2 95%   Physical Exam Vitals and nursing note reviewed.  Constitutional:      Appearance: He is obese. He is not ill-appearing or diaphoretic.  HENT:     Head: Normocephalic and atraumatic.  Eyes:     Conjunctiva/sclera: Conjunctivae normal.  Cardiovascular:     Rate and Rhythm: Normal rate and regular rhythm.     Pulses: Normal pulses.  Pulmonary:     Effort: Pulmonary effort is normal.     Breath sounds: Normal breath sounds. No wheezing, rhonchi or rales.  Abdominal:     Palpations: Abdomen is soft.     Tenderness: There is no abdominal tenderness. There is no guarding or rebound.  Musculoskeletal:     Cervical back: Neck supple.     Comments: No C or T midline spinal TTP. + midline incision to lumbar area with surrounding midline and paramusculature TTP. ROM intact to neck and back. Strength 5/5 to BUE and BLEs. Negative SLR bilaterally. 2+ PT pulses bilaterally.   No obvious deformity, ecchymosis, or edema noted to L hip. Pt points to this area when describing pain however no obvious TTP. No leg shortening or external rotation appreciated. ROM intact to hip. No tenderness distally.   Skin:  General: Skin is warm and dry.  Neurological:     Mental Status: He is alert.     ED Results / Procedures / Treatments    Labs (all labs ordered are listed, but only abnormal results are displayed) Labs Reviewed - No data to display  EKG None  Radiology DG Lumbar Spine Complete  Result Date: 03/07/2020 CLINICAL DATA:  Pain after fall EXAM: LUMBAR SPINE - COMPLETE 4+ VIEW COMPARISON:  03/24/2014, CT 11/19/2019 FINDINGS: Stable lumbar alignment. Posterior spinal rods and fixating screws L2 through S1 with interbody devices L2-L3, L3-L4, L4-L5 and L5-S1. Vertebral body heights are grossly maintained. Multiple level degenerative change including prominent degenerative changes at the lower thoracic spine. Aortic atherosclerosis. IMPRESSION: Postsurgical changes of the spine. No definite acute osseous abnormality. Electronically Signed   By: Donavan Foil M.D.   On: 03/07/2020 23:25   DG Hip Unilat With Pelvis 2-3 Views Left  Result Date: 03/07/2020 CLINICAL DATA:  Fall pain EXAM: DG HIP (WITH OR WITHOUT PELVIS) 2-3V LEFT COMPARISON:  None. FINDINGS: There is no evidence of hip fracture or dislocation. Moderate left hip osteoarthritis is seen with superior joint space loss and marginal osteophyte formation. Sclerosis around the bilateral sacroiliac joints. IMPRESSION: No acute osseous abnormality. Electronically Signed   By: Prudencio Pair M.D.   On: 03/07/2020 23:22    Procedures Procedures (including critical care time)  Medications Ordered in ED Medications  oxyCODONE-acetaminophen (PERCOCET/ROXICET) 5-325 MG per tablet 1 tablet (has no administration in time range)  carvedilol (COREG) tablet 6.25 mg (6.25 mg Oral Given 03/07/20 2308)    ED Course  I have reviewed the triage vital signs and the nursing notes.  Pertinent labs & imaging results that were available during my care of the patient were reviewed by me and considered in my medical decision making (see chart for details).    MDM Rules/Calculators/A&P                            64 year old male who presents to the ED today with complaint of lower  back pain diffusely and left hip pain status post mechanical fall that occurred earlier today where he slipped and fell on the ice.  He does have a history of L2-S2 lumbar fusion several years ago.  He takes chronic pain medication, states that he fell onto his abdomen and left hip today causing immediate pain to his back.  No head injury or loss of consciousness.  Patient able to ambulate after fall without difficulty.  On arrival to the ED vitals are stable.  Blood pressure is elevated on arrival at 193/87.  Patient took his morning medicines today, missed his evening meds.  Repeat blood pressure later on throughout the evening 215/95 prior to patient being brought back to the room.  Plan to provide evening medication and recheck blood pressure.  Patient did take his tramadol while in the waiting room with mild relief of his pain.  On exam he has no obvious ecchymosis, deformity to his left hip.  He points to this area when describing the pain however does not have any obvious tenderness palpation.  There is no obvious external rotation or leg shortening.  We will plan for an x-ray of the left hip to assess for any abnormality.  Patient also has midline lumbar and diffusely para musculature tenderness bilaterally to his lumbar area.  I suspect he is having muscular pain secondary to the fall however given  history of back surgery we will plan for an x-ray of his L-spine for further evaluation and to assess for any type of damage to the surgical hardware.  If no acute findings and blood pressure stabilized will discharge home with close PCP follow-up.   Xrays negative for acute findings at this time.  Repeat blood pressure 188/97 which is improved from previous. Will discharge at this time with PCP follow up. Have provided a short course of pain medication for pt. PDMP reviewed. Pt instructed on strict return precautions. He is in agreement with plan and stable for discharge home.   This note was prepared using  Dragon voice recognition software and may include unintentional dictation errors due to the inherent limitations of voice recognition software.  Final Clinical Impression(s) / ED Diagnoses Final diagnoses:  Fall, initial encounter  Muscle strain    Rx / DC Orders ED Discharge Orders         Ordered    oxyCODONE-acetaminophen (PERCOCET/ROXICET) 5-325 MG tablet  Every 6 hours PRN        03/08/20 0012           Discharge Instructions     Your xrays did not show any signs of fractures or hardware abnormalities. Your pain is likely related to muscle strain from your fall. Please pick up pain medication and take as needed for severe pain. You can also buy OTC voltaren gel to apply to your back to help with pain.   Follow up with your PCP for recheck of your symptoms  Return to the ED for any worsening symptoms.        Eustaquio Maize, PA-C 03/08/20 [id number]    Gareth Morgan, MD 03/08/20 2259

## 2020-03-07 NOTE — ED Triage Notes (Signed)
Pt arrived via walk in, s/p slip and fall on ice while at home. No head strike, no LOC. C/o diffuse back pain. Has had back surgery in the past.

## 2020-03-08 ENCOUNTER — Other Ambulatory Visit (HOSPITAL_COMMUNITY): Payer: Self-pay | Admitting: Medical

## 2020-03-08 DIAGNOSIS — Z85038 Personal history of other malignant neoplasm of large intestine: Secondary | ICD-10-CM | POA: Diagnosis not present

## 2020-03-08 DIAGNOSIS — Z955 Presence of coronary angioplasty implant and graft: Secondary | ICD-10-CM | POA: Diagnosis not present

## 2020-03-08 DIAGNOSIS — S39012A Strain of muscle, fascia and tendon of lower back, initial encounter: Secondary | ICD-10-CM | POA: Diagnosis not present

## 2020-03-08 DIAGNOSIS — Z794 Long term (current) use of insulin: Secondary | ICD-10-CM | POA: Diagnosis not present

## 2020-03-08 DIAGNOSIS — E119 Type 2 diabetes mellitus without complications: Secondary | ICD-10-CM | POA: Diagnosis not present

## 2020-03-08 DIAGNOSIS — I10 Essential (primary) hypertension: Secondary | ICD-10-CM | POA: Diagnosis not present

## 2020-03-08 DIAGNOSIS — Z951 Presence of aortocoronary bypass graft: Secondary | ICD-10-CM | POA: Diagnosis not present

## 2020-03-08 DIAGNOSIS — I2510 Atherosclerotic heart disease of native coronary artery without angina pectoris: Secondary | ICD-10-CM | POA: Diagnosis not present

## 2020-03-08 DIAGNOSIS — Z7982 Long term (current) use of aspirin: Secondary | ICD-10-CM | POA: Diagnosis not present

## 2020-03-08 MED ORDER — OXYCODONE-ACETAMINOPHEN 5-325 MG PO TABS: 1.0000 | ORAL_TABLET | Freq: Four times a day (QID) | ORAL | 0 refills | Status: DC | PRN

## 2020-03-08 MED ORDER — OXYCODONE-ACETAMINOPHEN 5-325 MG PO TABS
1.0000 | ORAL_TABLET | Freq: Once | ORAL | Status: AC
  Administered 2020-03-08: 1 via ORAL
  Filled 2020-03-08: qty 1

## 2020-03-08 MED FILL — OXYCODONE-ACETAMINOPHEN 5-3: 5-325 | 3 days supply | Qty: 15 | Fill #0

## 2020-03-08 NOTE — Discharge Instructions (Addendum)
Your xrays did not show any signs of fractures or hardware abnormalities. Your pain is likely related to muscle strain from your fall. Please pick up pain medication and take as needed for severe pain. You can also buy OTC voltaren gel to apply to your back to help with pain.   Follow up with your PCP for recheck of your symptoms  Return to the ED for any worsening symptoms.

## 2020-03-28 ENCOUNTER — Other Ambulatory Visit (HOSPITAL_BASED_OUTPATIENT_CLINIC_OR_DEPARTMENT_OTHER): Payer: Self-pay | Admitting: Internal Medicine

## 2020-03-28 ENCOUNTER — Ambulatory Visit: Payer: 59 | Attending: Internal Medicine

## 2020-03-28 DIAGNOSIS — Z23 Encounter for immunization: Secondary | ICD-10-CM

## 2020-03-28 MED FILL — MODERNA COVID-19 VACCINE 10: 100 | 28 days supply | Qty: 0 | Fill #0

## 2020-03-28 NOTE — Progress Notes (Signed)
   Covid-19 Vaccination Clinic  Name:  Spencer [name]    MRN: [id number] DOB: 11/15/1955  03/28/2020  [name] was observed post Covid-19 immunization for 15 minutes without incident. He was provided with Vaccine Information Sheet and instruction to access the V-Safe system.   Vaccinated By: Hoover Brunette  [name] was instructed to call 911 with any severe reactions post vaccine: Marland Kitchen Difficulty breathing  . Swelling of face and throat  . A fast heartbeat  . A bad rash all over body  . Dizziness and weakness   Immunizations Administered    Name Date Dose VIS Date Route   Moderna Covid-19 Booster Vaccine 03/28/2020  9:05 AM 0.25 mL 12/08/2019 Intramuscular   Manufacturer: Moderna   Lot: [id number]   [place]: [phone]

## 2020-03-29 ENCOUNTER — Other Ambulatory Visit (HOSPITAL_BASED_OUTPATIENT_CLINIC_OR_DEPARTMENT_OTHER): Payer: Self-pay | Admitting: Registered Nurse

## 2020-03-29 MED FILL — PRASUGREL HCL 10 MG TABS: 10 | 90 days supply | Qty: 90 | Fill #0

## 2020-03-30 DIAGNOSIS — N200 Calculus of kidney: Secondary | ICD-10-CM | POA: Diagnosis not present

## 2020-03-30 DIAGNOSIS — N35011 Post-traumatic bulbous urethral stricture: Secondary | ICD-10-CM | POA: Diagnosis not present

## 2020-04-04 ENCOUNTER — Other Ambulatory Visit: Payer: Self-pay

## 2020-04-04 ENCOUNTER — Other Ambulatory Visit (HOSPITAL_COMMUNITY): Payer: Self-pay | Admitting: Cardiovascular Disease

## 2020-04-04 ENCOUNTER — Ambulatory Visit (HOSPITAL_COMMUNITY)
Admission: RE | Admit: 2020-04-04 | Discharge: 2020-04-04 | Disposition: A | Discharge status: Home / Self Care | Payer: 59 | Source: Ambulatory Visit | Attending: Cardiology | Admitting: Cardiology

## 2020-04-04 ENCOUNTER — Encounter: Payer: Self-pay | Admitting: Cardiovascular Disease

## 2020-04-04 ENCOUNTER — Ambulatory Visit (INDEPENDENT_AMBULATORY_CARE_PROVIDER_SITE_OTHER): Payer: 59 | Admitting: Cardiovascular Disease

## 2020-04-04 VITALS — BP 158/75 | HR 75 | Ht 73.0 in | Wt 277.6 lb

## 2020-04-04 DIAGNOSIS — I6523 Occlusion and stenosis of bilateral carotid arteries: Secondary | ICD-10-CM

## 2020-04-04 DIAGNOSIS — I739 Peripheral vascular disease, unspecified: Secondary | ICD-10-CM

## 2020-04-04 DIAGNOSIS — H52203 Unspecified astigmatism, bilateral: Secondary | ICD-10-CM | POA: Diagnosis not present

## 2020-04-04 DIAGNOSIS — E785 Hyperlipidemia, unspecified: Secondary | ICD-10-CM

## 2020-04-04 DIAGNOSIS — I2510 Atherosclerotic heart disease of native coronary artery without angina pectoris: Secondary | ICD-10-CM | POA: Diagnosis not present

## 2020-04-04 DIAGNOSIS — I10 Essential (primary) hypertension: Secondary | ICD-10-CM | POA: Diagnosis not present

## 2020-04-04 MED FILL — REPATHA SURECLICK 140 MG/ML: 140 | 30 days supply | Qty: 2 | Fill #2

## 2020-04-04 NOTE — Progress Notes (Signed)
Cardiology Office Note   Date:  04/04/2020   ID:  Spencer, [name] 02-Oct-1955, MRN [id number]  PCP:  Burnard Bunting, MD  Cardiologist:   Kathlyn Sacramento, MD   No chief complaint on file.     History of Present Illness: Spencer [name] is a 65 y.o. male who is here today for follow-up visit regarding peripheral arterial disease and CAD. He has known history of coronary artery disease status post CABG in 2012, mild ischemic cardiomyopathy with an EF of 45%, diabetes mellitus, bilateral carotid disease, obesity, hyperlipidemia with intolerance to statins and peripheral arterial disease.  He is status post PCI and drug-eluting stent placement to both SVG to OM and SVG to RCA in 2015.  He had vascular work-up done in 2019. ABI were not reliable due to calcifications.  Angiography in June 2019 showed no significant aortoiliac disease.  There was mild to moderate SFA and popliteal disease.  One-vessel runoff below the knee bilaterally via the peroneal artery which gave collaterals distally to the dorsalis pedis and plantar branches of the posterior tibial artery.  No revascularization was needed.    He has been doing very well with no recent chest pain, shortness of breath or palpitations.  No leg claudication or ulceration.   He fell twice since October but there was no loss of consciousness.  It was mainly due to losing balance.  He takes his medications regularly.    Past Medical History:  Diagnosis Date  . Anxiety attack   . Arthritis    "back; shoulders; knees" (07/23/2013)  . Back pain, chronic    "all over" (07/23/2013)  . Benign neoplasm of colon 04/01/2000   Hyperplastic  . Coronary artery disease    a. NSTEMI 10/12: s/p CABG with L-LAD, S-OM1/OM2, S-RV marg/RCA;  b. 07/2013 Cath/PCI: LM <10, LAD 70-80p, 51m, D1 90, RI 30ost, LCX 40-50p, 64m, OM1 90, OM2 100, RCA 100p, VG->OM1->OM2 patent with 90 @ anast (2.5x12 Xience Alpine DES), RCA 100p, LIMA->LAD nl, VG->Diag nl, VG->RV  marginal/RCA 90p (3.0x23 Xience Alpine DES);  c. 07/2013 Echo: EF 55-60%.  . Depression    "pain related"  . DM2 (diabetes mellitus, type 2) ([place])   . GERD (gastroesophageal reflux disease)   . Hyperlipidemia   . Hypertension   . IBS (irritable bowel syndrome)   . Knee pain, bilateral    a. since 01/2013.  . Morbidly obese ([place])   . Myocardial infarction ([place]) 2012  . Obesity   . Pleural effusion, left 11/2010   Postop; resolved with p.o. diuresis    Past Surgical History:  Procedure Laterality Date  . ABDOMINAL AORTOGRAM W/LOWER EXTREMITY N/A 07/30/2017   Procedure: ABDOMINAL AORTOGRAM W/LOWER EXTREMITY;  Surgeon: Wellington Hampshire, MD;  Location: [facility];  Service: Cardiovascular;  Laterality: N/A;  . BACK SURGERY    . CARDIAC CATHETERIZATION  11/2010  . CARPAL TUNNEL RELEASE Bilateral   . CORONARY ANGIOPLASTY WITH STENT PLACEMENT  07/23/2013   "2"  . CORONARY ARTERY BYPASS GRAFT  11/26/10   x 6 SURGEON DR. PETER VAN TRIGT  . HERNIA REPAIR     UHR  . LEFT HEART CATHETERIZATION WITH CORONARY/GRAFT ANGIOGRAM N/A 07/23/2013   Procedure: LEFT HEART CATHETERIZATION WITH Beatrix Fetters;  Surgeon: Peter M Martinique, MD;  Location: [facility];  Service: Cardiovascular;  Laterality: N/A;  . LUMBAR FUSION     MULTIPLE; DR. Ellene Route  . SHOULDER ARTHROSCOPY W/ ROTATOR CUFF REPAIR Bilateral   .  UMBILICAL HERNIA REPAIR       Current Outpatient Medications  Medication Sig Dispense Refill  . Ascorbic Acid (VITAMIN C) 1000 MG tablet Take 1,000 mg by mouth daily.    Marland Kitchen aspirin EC 81 MG tablet Take 81 mg by mouth daily.    . carvedilol (COREG) 6.25 MG tablet TAKE 1 TABLET BY MOUTH TWICE DAILY (Patient taking differently: Take 6.25 mg by mouth in the morning and at bedtime.) 180 tablet 2  . cholecalciferol (VITAMIN D) 1000 UNITS tablet Take 1,000 Units by mouth daily.    Marland Kitchen docusate sodium (COLACE) 100 MG capsule Take 100 mg by mouth 2 (two) times daily.    . famotidine (PEPCID)  20 MG tablet Take 20 mg by mouth 2 (two) times daily.    Marland Kitchen FREESTYLE LITE test strip     . HUMALOG KWIKPEN 100 UNIT/ML KwikPen Inject 4 Units into the skin See admin instructions. With meals as directed    . Icosapent Ethyl (VASCEPA) 1 g CAPS Take 2 capsules (2 g total) by mouth 2 (two) times daily. 360 capsule 3  . Insulin Glargine (BASAGLAR KWIKPEN) 100 UNIT/ML Inject 32 Units into the skin daily.    Marland Kitchen JARDIANCE 25 MG TABS tablet Take 25 mg by mouth daily.   1  . lisinopril (PRINIVIL,ZESTRIL) 40 MG tablet Take 1 tablet (40 mg total) by mouth daily. 30 tablet 6  . metFORMIN (GLUCOPHAGE-XR) 500 MG 24 hr tablet Take 2 tablets (1,000 mg total) by mouth 2 (two) times daily. 0530 & 1730  3  . methocarbamol (ROBAXIN) 500 MG tablet Take 500 mg by mouth every 8 (eight) hours. scheduled    . metroNIDAZOLE (METROGEL) 1 % gel Apply 1 application topically daily as needed (rosacea).    . nitroGLYCERIN (NITROSTAT) 0.4 MG SL tablet Place 1 tablet (0.4 mg total) under the tongue every 5 (five) minutes as needed for chest pain (CP or SOB). 25 tablet 3  . oxycodone (OXY-IR) 5 MG capsule Take 1 capsule (5 mg total) by mouth every 4 (four) hours as needed. 15 capsule 0  . oxyCODONE-acetaminophen (PERCOCET/ROXICET) 5-325 MG tablet Take 1 tablet by mouth every 6 (six) hours as needed for severe pain. 15 tablet 0  . PENTIPS 31G X 5 MM MISC     . polyethylene glycol powder (GLYCOLAX/MIRALAX) 17 GM/SCOOP powder Take 17 g by mouth daily as needed (constipation).    . prasugrel (EFFIENT) 10 MG TABS tablet Take 1 tablet by mouth daily.  3  . pregabalin (LYRICA) 150 MG capsule Take 150 mg by mouth 2 (two) times daily. 0530 & 1730    . Probiotic Product (PROBIOTIC PO) Take 1 each by mouth daily. Keifer liquid yogurt probiotic    . pyridOXINE (VITAMIN B-6) 100 MG tablet Take 100 mg by mouth daily.    Marland Kitchen REPATHA SURECLICK 099 MG/ML SOAJ INJECT 140 MG INTO THE SKIN EVERY 14 DAYS. 2 mL 11  . senna-docusate (SENOKOT-S) 8.6-50 MG  tablet Take 1 tablet by mouth at bedtime as needed for mild constipation. 30 tablet 0  . sertraline (ZOLOFT) 50 MG tablet Take 50 mg by mouth 2 (two) times daily. 0530 & 1730    . traMADol (ULTRAM) 50 MG tablet Take 100 mg by mouth in the morning, at noon, and at bedtime. scheduled    . zolpidem (AMBIEN) 10 MG tablet Take 5 mg by mouth at bedtime.     No current facility-administered medications for this visit.    Allergies:  Victoza [liraglutide], Brilinta [ticagrelor], Codeine, Other, and Statins    Social History:  The patient  reports that he has never smoked. He has never used smokeless tobacco. He reports that he does not drink alcohol and does not use drugs.   Family History:  The patient's family history is not on file.    ROS:  Please see the history of present illness.   Otherwise, review of systems are positive for none.   All other systems are reviewed and negative.    PHYSICAL EXAM: VS:  BP (!) 158/75   Pulse 75   Ht 6\' 1"  (1.854 m)   Wt 277 lb 9.6 oz (125.9 kg)   SpO2 99%   BMI 36.62 kg/m  , BMI Body mass index is 36.62 kg/m. GEN: Well nourished, well developed, in no acute distress  HEENT: normal  Neck: no JVD, carotid bruits, or masses Cardiac: RRR; no murmurs, rubs, or gallops,no edema  Respiratory:  clear to auscultation bilaterally, normal work of breathing GI: soft, nontender, nondistended, + BS MS: no deformity or atrophy  Skin: warm and dry, no rash Neuro:  Strength and sensation are intact Psych: euthymic mood, full affect Vascular: Femoral pulses are normal bilaterally.    EKG:  EKG is not ordered today.    Recent Labs: 11/19/2019: ALT 28; Hemoglobin 14.4; Platelets 209 11/20/2019: BUN 19; Creatinine, Ser 0.90; Potassium 4.1; Sodium 137    Lipid Panel    Component Value Date/Time   CHOL 135 12/23/2017 0907   TRIG 223 (H) 12/23/2017 0907   HDL 35 (L) 12/23/2017 0907   CHOLHDL 3.9 12/23/2017 0907   CHOLHDL 7.0 07/23/2013 0155   VLDL 59 (H)  07/23/2013 0155   LDLCALC 55 12/23/2017 0907      Wt Readings from Last 3 Encounters:  04/04/20 277 lb 9.6 oz (125.9 kg)  01/31/20 289 lb (131.1 kg)  11/20/19 284 lb 14.4 oz (129.2 kg)       No flowsheet data found.    ASSESSMENT AND PLAN:  1.  Peripheral arterial disease: The patient has one-vessel runoff below the knee bilaterally via the peroneal artery with no other obstructive disease.  Currently with no claudication or critical limb ischemia. No revascularization is needed.  Continue aggressive medical therapy.  2.  Coronary artery disease involving native coronary arteries without angina: He continues to be on dual antiplatelet therapy with aspirin and prasugrel given previous PCI on vein grafts.   No bleeding issues.  I discussed with him the option of switching to clopidogrel but he prefers to stay on prasugrel.  3.  Bilateral carotid disease: Most recent carotid Doppler showed 40 to 59% right carotid stenosis and 60 to 79% left carotid stenosis approaching 80%.  He is getting a follow-up carotid Doppler today.   4.  Essential hypertension: Blood pressure is elevated today but blood pressure is usually controlled.  Continue to monitor for now.  5.  Hyperlipidemia: He is intolerant to all statins due to severe myalgia.   Continue treatment with Repatha and Vascepa.      Disposition:   FU with me in 6 months  Signed,  Kathlyn Sacramento, MD  04/04/2020 10:24 AM    [place]

## 2020-04-04 NOTE — Patient Instructions (Signed)

## 2020-04-06 ENCOUNTER — Encounter: Payer: Self-pay | Admitting: *Deleted

## 2020-04-06 DIAGNOSIS — N35919 Unspecified urethral stricture, male, unspecified site: Secondary | ICD-10-CM | POA: Diagnosis not present

## 2020-04-06 DIAGNOSIS — R339 Retention of urine, unspecified: Secondary | ICD-10-CM | POA: Diagnosis not present

## 2020-04-17 MED FILL — BASAGLAR 100 UNIT/ML KWIKPE: 100 | 66 days supply | Qty: 18 | Fill #3

## 2020-04-25 ENCOUNTER — Other Ambulatory Visit (HOSPITAL_BASED_OUTPATIENT_CLINIC_OR_DEPARTMENT_OTHER): Payer: Self-pay | Admitting: Internal Medicine

## 2020-04-25 MED FILL — LISINOPRIL 40 MG TABS: 40 | 90 days supply | Qty: 90 | Fill #0

## 2020-04-25 MED FILL — CARVEDILOL 6.25 MG TABLET: 6.25 | 90 days supply | Qty: 180 | Fill #2

## 2020-05-08 MED FILL — REPATHA SURECLICK 140 MG/ML: 140 | 30 days supply | Qty: 2 | Fill #3

## 2020-05-09 ENCOUNTER — Other Ambulatory Visit (HOSPITAL_BASED_OUTPATIENT_CLINIC_OR_DEPARTMENT_OTHER): Payer: Self-pay | Admitting: Pharmacotherapy

## 2020-05-09 DIAGNOSIS — E1140 Type 2 diabetes mellitus with diabetic neuropathy, unspecified: Secondary | ICD-10-CM | POA: Diagnosis not present

## 2020-05-09 DIAGNOSIS — Z794 Long term (current) use of insulin: Secondary | ICD-10-CM | POA: Diagnosis not present

## 2020-05-09 DIAGNOSIS — I10 Essential (primary) hypertension: Secondary | ICD-10-CM | POA: Diagnosis not present

## 2020-05-09 DIAGNOSIS — E785 Hyperlipidemia, unspecified: Secondary | ICD-10-CM | POA: Diagnosis not present

## 2020-05-09 DIAGNOSIS — I2510 Atherosclerotic heart disease of native coronary artery without angina pectoris: Secondary | ICD-10-CM | POA: Diagnosis not present

## 2020-05-09 MED FILL — metroNIDAZOLE 1 % GEL: 1 | 30 days supply | Qty: 60 | Fill #0

## 2020-05-09 MED FILL — TOUJEO MAX SOLOSTAR 300 UNI: 300 | 28 days supply | Qty: 3 | Fill #0

## 2020-05-17 ENCOUNTER — Other Ambulatory Visit (HOSPITAL_BASED_OUTPATIENT_CLINIC_OR_DEPARTMENT_OTHER): Payer: Self-pay | Admitting: Registered Nurse

## 2020-05-17 MED FILL — METHOCARBAMOL 500 MG TABLET: 500 | 90 days supply | Qty: 270 | Fill #1

## 2020-05-17 MED FILL — ZOLPIDEM TARTRATE 10 MG TAB: 10 | 30 days supply | Qty: 30 | Fill #0

## 2020-05-17 MED FILL — traMADol HCL 50 MG TABS: 50 | 90 days supply | Qty: 720 | Fill #0

## 2020-05-31 DIAGNOSIS — E785 Hyperlipidemia, unspecified: Secondary | ICD-10-CM | POA: Diagnosis not present

## 2020-05-31 DIAGNOSIS — I10 Essential (primary) hypertension: Secondary | ICD-10-CM | POA: Diagnosis not present

## 2020-05-31 DIAGNOSIS — Z0000 Encounter for general adult medical examination without abnormal findings: Secondary | ICD-10-CM | POA: Diagnosis not present

## 2020-05-31 DIAGNOSIS — E1140 Type 2 diabetes mellitus with diabetic neuropathy, unspecified: Secondary | ICD-10-CM | POA: Diagnosis not present

## 2020-05-31 DIAGNOSIS — R82998 Other abnormal findings in urine: Secondary | ICD-10-CM | POA: Diagnosis not present

## 2020-05-31 DIAGNOSIS — Z125 Encounter for screening for malignant neoplasm of prostate: Secondary | ICD-10-CM | POA: Diagnosis not present

## 2020-06-01 ENCOUNTER — Other Ambulatory Visit (HOSPITAL_BASED_OUTPATIENT_CLINIC_OR_DEPARTMENT_OTHER): Payer: Self-pay

## 2020-06-01 MED ORDER — INSULIN PEN NEEDLE 32G X 6 MM MISC
3 refills | Status: AC
  Filled 2020-06-01: qty 100, 90d supply, fill #0
  Filled 2020-10-30: qty 100, 90d supply, fill #1
  Filled 2021-01-30: qty 100, 90d supply, fill #2
  Filled 2021-03-12: qty 100, 90d supply, fill #3

## 2020-06-01 MED FILL — Icosapent Ethyl Cap 1 GM: ORAL | 90 days supply | Qty: 360 | Fill #0 | Status: AC

## 2020-06-01 MED FILL — Famotidine Tab 20 MG: ORAL | 90 days supply | Qty: 360 | Fill #0 | Status: AC

## 2020-06-01 MED FILL — Metformin HCl Tab ER 24HR 500 MG: ORAL | 90 days supply | Qty: 360 | Fill #0 | Status: AC

## 2020-06-01 MED FILL — Empagliflozin Tab 25 MG: ORAL | 90 days supply | Qty: 90 | Fill #0 | Status: AC

## 2020-06-01 MED FILL — Sertraline HCl Tab 50 MG: ORAL | 90 days supply | Qty: 180 | Fill #0 | Status: AC

## 2020-06-05 ENCOUNTER — Other Ambulatory Visit (HOSPITAL_BASED_OUTPATIENT_CLINIC_OR_DEPARTMENT_OTHER): Payer: Self-pay

## 2020-06-05 MED FILL — Evolocumab Subcutaneous Soln Auto-Injector 140 MG/ML: SUBCUTANEOUS | 28 days supply | Qty: 2 | Fill #0 | Status: AC

## 2020-06-05 MED FILL — Insulin Glargine Soln Pen-Injector 300 Unit/ML (2 Unit Dial): SUBCUTANEOUS | 56 days supply | Qty: 6 | Fill #0 | Status: CN

## 2020-06-06 ENCOUNTER — Other Ambulatory Visit (HOSPITAL_BASED_OUTPATIENT_CLINIC_OR_DEPARTMENT_OTHER): Payer: Self-pay

## 2020-06-06 MED FILL — Insulin Glargine Soln Pen-Injector 300 Unit/ML (2 Unit Dial): SUBCUTANEOUS | 84 days supply | Qty: 9 | Fill #0 | Status: AC

## 2020-06-07 ENCOUNTER — Other Ambulatory Visit (HOSPITAL_BASED_OUTPATIENT_CLINIC_OR_DEPARTMENT_OTHER): Payer: Self-pay

## 2020-06-07 DIAGNOSIS — I10 Essential (primary) hypertension: Secondary | ICD-10-CM | POA: Diagnosis not present

## 2020-06-07 DIAGNOSIS — G629 Polyneuropathy, unspecified: Secondary | ICD-10-CM | POA: Diagnosis not present

## 2020-06-07 DIAGNOSIS — E785 Hyperlipidemia, unspecified: Secondary | ICD-10-CM | POA: Diagnosis not present

## 2020-06-07 DIAGNOSIS — I2510 Atherosclerotic heart disease of native coronary artery without angina pectoris: Secondary | ICD-10-CM | POA: Diagnosis not present

## 2020-06-07 DIAGNOSIS — Z794 Long term (current) use of insulin: Secondary | ICD-10-CM | POA: Diagnosis not present

## 2020-06-07 DIAGNOSIS — E669 Obesity, unspecified: Secondary | ICD-10-CM | POA: Diagnosis not present

## 2020-06-07 DIAGNOSIS — Z0000 Encounter for general adult medical examination without abnormal findings: Secondary | ICD-10-CM | POA: Diagnosis not present

## 2020-06-07 DIAGNOSIS — E1151 Type 2 diabetes mellitus with diabetic peripheral angiopathy without gangrene: Secondary | ICD-10-CM | POA: Diagnosis not present

## 2020-06-07 DIAGNOSIS — Z1339 Encounter for screening examination for other mental health and behavioral disorders: Secondary | ICD-10-CM | POA: Diagnosis not present

## 2020-06-07 DIAGNOSIS — G4700 Insomnia, unspecified: Secondary | ICD-10-CM | POA: Diagnosis not present

## 2020-06-07 DIAGNOSIS — I739 Peripheral vascular disease, unspecified: Secondary | ICD-10-CM | POA: Diagnosis not present

## 2020-06-07 DIAGNOSIS — Z1331 Encounter for screening for depression: Secondary | ICD-10-CM | POA: Diagnosis not present

## 2020-06-07 MED ORDER — PREGABALIN 150 MG PO CAPS
ORAL_CAPSULE | ORAL | 1 refills | Status: DC
  Filled 2020-06-07: qty 180, 90d supply, fill #0
  Filled 2020-08-30: qty 180, 90d supply, fill #1

## 2020-06-08 ENCOUNTER — Other Ambulatory Visit (HOSPITAL_COMMUNITY): Payer: Self-pay

## 2020-06-09 ENCOUNTER — Other Ambulatory Visit (HOSPITAL_BASED_OUTPATIENT_CLINIC_OR_DEPARTMENT_OTHER): Payer: Self-pay

## 2020-06-09 MED ORDER — TOUJEO MAX SOLOSTAR 300 UNIT/ML ~~LOC~~ SOPN
PEN_INJECTOR | SUBCUTANEOUS | 11 refills | Status: DC
  Filled 2020-06-09: qty 15, 45d supply, fill #0

## 2020-06-09 MED ORDER — TOUJEO MAX SOLOSTAR 300 UNIT/ML ~~LOC~~ SOPN
PEN_INJECTOR | SUBCUTANEOUS | 6 refills | Status: DC
  Filled 2020-06-09: qty 3, 28d supply, fill #0

## 2020-06-13 ENCOUNTER — Other Ambulatory Visit (HOSPITAL_BASED_OUTPATIENT_CLINIC_OR_DEPARTMENT_OTHER): Payer: Self-pay

## 2020-06-14 ENCOUNTER — Other Ambulatory Visit (HOSPITAL_BASED_OUTPATIENT_CLINIC_OR_DEPARTMENT_OTHER): Payer: Self-pay

## 2020-06-15 ENCOUNTER — Other Ambulatory Visit (HOSPITAL_BASED_OUTPATIENT_CLINIC_OR_DEPARTMENT_OTHER): Payer: Self-pay

## 2020-06-21 ENCOUNTER — Other Ambulatory Visit (HOSPITAL_BASED_OUTPATIENT_CLINIC_OR_DEPARTMENT_OTHER): Payer: Self-pay

## 2020-06-23 ENCOUNTER — Other Ambulatory Visit (HOSPITAL_BASED_OUTPATIENT_CLINIC_OR_DEPARTMENT_OTHER): Payer: Self-pay

## 2020-06-23 MED FILL — Prasugrel HCl Tab 10 MG (Base Equiv): ORAL | 90 days supply | Qty: 90 | Fill #0 | Status: AC

## 2020-06-26 ENCOUNTER — Other Ambulatory Visit (HOSPITAL_BASED_OUTPATIENT_CLINIC_OR_DEPARTMENT_OTHER): Payer: Self-pay

## 2020-06-28 ENCOUNTER — Other Ambulatory Visit (HOSPITAL_BASED_OUTPATIENT_CLINIC_OR_DEPARTMENT_OTHER): Payer: Self-pay

## 2020-07-05 ENCOUNTER — Other Ambulatory Visit (HOSPITAL_BASED_OUTPATIENT_CLINIC_OR_DEPARTMENT_OTHER): Payer: Self-pay

## 2020-07-05 MED FILL — Evolocumab Subcutaneous Soln Auto-Injector 140 MG/ML: SUBCUTANEOUS | 28 days supply | Qty: 2 | Fill #1 | Status: AC

## 2020-07-25 ENCOUNTER — Other Ambulatory Visit: Payer: Self-pay | Admitting: Cardiovascular Disease

## 2020-07-25 MED FILL — Lisinopril Tab 40 MG: ORAL | 90 days supply | Qty: 90 | Fill #0 | Status: AC

## 2020-07-26 ENCOUNTER — Other Ambulatory Visit (HOSPITAL_BASED_OUTPATIENT_CLINIC_OR_DEPARTMENT_OTHER): Payer: Self-pay

## 2020-07-26 MED ORDER — CARVEDILOL 6.25 MG PO TABS
ORAL_TABLET | Freq: Two times a day (BID) | ORAL | 2 refills | Status: DC
  Filled 2020-07-26: qty 180, 90d supply, fill #0
  Filled 2020-10-27: qty 180, 90d supply, fill #1
  Filled 2021-01-24: qty 180, 90d supply, fill #2

## 2020-07-26 NOTE — Telephone Encounter (Signed)
Refill request

## 2020-07-27 ENCOUNTER — Other Ambulatory Visit (HOSPITAL_BASED_OUTPATIENT_CLINIC_OR_DEPARTMENT_OTHER): Payer: Self-pay

## 2020-08-08 ENCOUNTER — Other Ambulatory Visit (HOSPITAL_BASED_OUTPATIENT_CLINIC_OR_DEPARTMENT_OTHER): Payer: Self-pay

## 2020-08-08 MED FILL — Evolocumab Subcutaneous Soln Auto-Injector 140 MG/ML: SUBCUTANEOUS | 28 days supply | Qty: 2 | Fill #2 | Status: AC

## 2020-08-09 ENCOUNTER — Other Ambulatory Visit (HOSPITAL_BASED_OUTPATIENT_CLINIC_OR_DEPARTMENT_OTHER): Payer: Self-pay

## 2020-08-19 MED FILL — Methocarbamol Tab 500 MG: ORAL | 90 days supply | Qty: 270 | Fill #0 | Status: AC

## 2020-08-19 MED FILL — Tramadol HCl Tab 50 MG: ORAL | 90 days supply | Qty: 720 | Fill #0 | Status: AC

## 2020-08-19 MED FILL — Zolpidem Tartrate Tab 10 MG: ORAL | 30 days supply | Qty: 30 | Fill #0 | Status: AC

## 2020-08-22 ENCOUNTER — Other Ambulatory Visit (HOSPITAL_BASED_OUTPATIENT_CLINIC_OR_DEPARTMENT_OTHER): Payer: Self-pay

## 2020-08-24 ENCOUNTER — Other Ambulatory Visit (HOSPITAL_BASED_OUTPATIENT_CLINIC_OR_DEPARTMENT_OTHER): Payer: Self-pay

## 2020-08-30 ENCOUNTER — Other Ambulatory Visit (HOSPITAL_BASED_OUTPATIENT_CLINIC_OR_DEPARTMENT_OTHER): Payer: Self-pay

## 2020-08-30 MED FILL — Insulin Glargine Soln Pen-Injector 300 Unit/ML (2 Unit Dial): SUBCUTANEOUS | 84 days supply | Qty: 9 | Fill #1 | Status: AC

## 2020-08-30 MED FILL — Evolocumab Subcutaneous Soln Auto-Injector 140 MG/ML: SUBCUTANEOUS | 28 days supply | Qty: 2 | Fill #3 | Status: AC

## 2020-08-30 MED FILL — Metronidazole Gel 1%: CUTANEOUS | 30 days supply | Qty: 60 | Fill #0 | Status: AC

## 2020-08-31 ENCOUNTER — Other Ambulatory Visit (HOSPITAL_BASED_OUTPATIENT_CLINIC_OR_DEPARTMENT_OTHER): Payer: Self-pay

## 2020-09-01 ENCOUNTER — Other Ambulatory Visit (HOSPITAL_BASED_OUTPATIENT_CLINIC_OR_DEPARTMENT_OTHER): Payer: Self-pay

## 2020-09-01 MED ORDER — INSULIN LISPRO (1 UNIT DIAL) 100 UNIT/ML (KWIKPEN)
PEN_INJECTOR | SUBCUTANEOUS | 3 refills | Status: DC
  Filled 2020-09-01: qty 9, 75d supply, fill #0

## 2020-09-01 MED ORDER — JARDIANCE 25 MG PO TABS
ORAL_TABLET | ORAL | 3 refills | Status: AC
  Filled 2020-09-01: qty 90, 90d supply, fill #0
  Filled 2020-11-29: qty 90, 90d supply, fill #1
  Filled 2021-02-27: qty 90, 90d supply, fill #2
  Filled 2021-05-28: qty 90, 90d supply, fill #3

## 2020-09-01 MED ORDER — PRASUGREL HCL 10 MG PO TABS
10.0000 mg | ORAL_TABLET | Freq: Every day | ORAL | 3 refills | Status: AC
  Filled 2020-09-01 – 2021-01-09 (×2): qty 90, 90d supply, fill #0
  Filled 2021-04-03: qty 90, 90d supply, fill #1

## 2020-09-01 MED ORDER — METFORMIN HCL ER 500 MG PO TB24
ORAL_TABLET | ORAL | 3 refills | Status: DC
  Filled 2020-09-01: qty 180, 90d supply, fill #0
  Filled 2020-11-29: qty 180, 90d supply, fill #1
  Filled 2021-02-27: qty 180, 90d supply, fill #2

## 2020-09-01 MED ORDER — REPATHA SURECLICK 140 MG/ML ~~LOC~~ SOAJ
140.0000 mg | SUBCUTANEOUS | 11 refills | Status: AC
  Filled 2020-09-01: qty 4, 60d supply, fill #0
  Filled 2021-02-28: qty 4, 56d supply, fill #0
  Filled 2021-04-04 – 2021-04-19 (×2): qty 4, 56d supply, fill #1
  Filled 2021-05-23: qty 4, 56d supply, fill #2

## 2020-09-01 MED ORDER — ICOSAPENT ETHYL 1 G PO CAPS
ORAL_CAPSULE | ORAL | 3 refills | Status: AC
  Filled 2020-09-01: qty 360, 90d supply, fill #0
  Filled 2020-11-29: qty 360, 90d supply, fill #1
  Filled 2021-02-27: qty 360, 90d supply, fill #2
  Filled 2021-06-06: qty 360, 90d supply, fill #3

## 2020-09-01 MED ORDER — TOUJEO MAX SOLOSTAR 300 UNIT/ML ~~LOC~~ SOPN
PEN_INJECTOR | SUBCUTANEOUS | 11 refills | Status: AC
  Filled 2020-09-01 – 2020-11-29 (×2): qty 12, 36d supply, fill #0
  Filled 2021-01-03: qty 12, 36d supply, fill #1
  Filled 2021-02-05: qty 12, 36d supply, fill #2
  Filled 2021-03-21: qty 12, 36d supply, fill #3
  Filled 2021-04-16 – 2021-04-18 (×2): qty 12, 36d supply, fill #4

## 2020-09-01 MED ORDER — LISINOPRIL 40 MG PO TABS
ORAL_TABLET | ORAL | 3 refills | Status: DC
  Filled 2020-09-01 – 2020-10-27 (×2): qty 90, 90d supply, fill #0
  Filled 2021-01-24: qty 90, 90d supply, fill #1

## 2020-09-04 ENCOUNTER — Other Ambulatory Visit (HOSPITAL_BASED_OUTPATIENT_CLINIC_OR_DEPARTMENT_OTHER): Payer: Self-pay

## 2020-09-04 MED ORDER — ZOLPIDEM TARTRATE 10 MG PO TABS
ORAL_TABLET | ORAL | 1 refills | Status: DC
  Filled 2020-09-04: qty 90, 90d supply, fill #0

## 2020-09-06 ENCOUNTER — Other Ambulatory Visit (HOSPITAL_BASED_OUTPATIENT_CLINIC_OR_DEPARTMENT_OTHER): Payer: Self-pay

## 2020-09-06 DIAGNOSIS — I10 Essential (primary) hypertension: Secondary | ICD-10-CM | POA: Diagnosis not present

## 2020-09-06 DIAGNOSIS — I2510 Atherosclerotic heart disease of native coronary artery without angina pectoris: Secondary | ICD-10-CM | POA: Diagnosis not present

## 2020-09-06 DIAGNOSIS — G720 Drug-induced myopathy: Secondary | ICD-10-CM | POA: Diagnosis not present

## 2020-09-06 DIAGNOSIS — E1140 Type 2 diabetes mellitus with diabetic neuropathy, unspecified: Secondary | ICD-10-CM | POA: Diagnosis not present

## 2020-09-06 DIAGNOSIS — Z794 Long term (current) use of insulin: Secondary | ICD-10-CM | POA: Diagnosis not present

## 2020-09-06 MED ORDER — SERTRALINE HCL 50 MG PO TABS
50.0000 mg | ORAL_TABLET | Freq: Two times a day (BID) | ORAL | 2 refills | Status: DC
  Filled 2020-09-06: qty 180, 90d supply, fill #0
  Filled 2020-11-29: qty 180, 90d supply, fill #1
  Filled 2021-02-27: qty 180, 90d supply, fill #2

## 2020-09-06 MED ORDER — FAMOTIDINE 20 MG PO TABS
ORAL_TABLET | Freq: Two times a day (BID) | ORAL | 3 refills | Status: AC
  Filled 2020-09-06: qty 360, 90d supply, fill #0
  Filled 2020-11-29: qty 360, 90d supply, fill #1
  Filled 2021-02-27: qty 360, 90d supply, fill #2
  Filled 2021-05-28: qty 360, 90d supply, fill #3

## 2020-09-07 ENCOUNTER — Other Ambulatory Visit (HOSPITAL_BASED_OUTPATIENT_CLINIC_OR_DEPARTMENT_OTHER): Payer: Self-pay

## 2020-09-08 ENCOUNTER — Other Ambulatory Visit (HOSPITAL_BASED_OUTPATIENT_CLINIC_OR_DEPARTMENT_OTHER): Payer: Self-pay

## 2020-09-21 ENCOUNTER — Other Ambulatory Visit (HOSPITAL_BASED_OUTPATIENT_CLINIC_OR_DEPARTMENT_OTHER): Payer: Self-pay

## 2020-09-26 ENCOUNTER — Ambulatory Visit: Payer: 59 | Attending: Internal Medicine

## 2020-09-26 DIAGNOSIS — Z23 Encounter for immunization: Secondary | ICD-10-CM

## 2020-09-26 NOTE — Progress Notes (Signed)
   Covid-19 Vaccination Clinic  Name:  Spencer [name]    MRN: [id number] DOB: 1955-04-02  09/26/2020  Spencer [name] was observed post Covid-19 immunization for 15 minutes without incident. He was provided with Vaccine Information Sheet and instruction to access the V-Safe system.   Spencer [name] was instructed to call [phone] with any severe reactions post vaccine: Difficulty breathing  Swelling of face and throat  A fast heartbeat  A bad rash all over body  Dizziness and weakness   Immunizations Administered     Name Date Dose VIS Date Route   Moderna Covid-19 Booster Vaccine 09/26/2020 10:01 AM 0.25 mL 12/08/2019 Intramuscular   Manufacturer: Moderna   Lot: [id number]   [id number]

## 2020-09-27 ENCOUNTER — Other Ambulatory Visit (HOSPITAL_BASED_OUTPATIENT_CLINIC_OR_DEPARTMENT_OTHER): Payer: Self-pay

## 2020-09-27 MED FILL — Prasugrel HCl Tab 10 MG (Base Equiv): ORAL | 90 days supply | Qty: 90 | Fill #1 | Status: AC

## 2020-09-28 ENCOUNTER — Other Ambulatory Visit (HOSPITAL_BASED_OUTPATIENT_CLINIC_OR_DEPARTMENT_OTHER): Payer: Self-pay

## 2020-10-02 ENCOUNTER — Other Ambulatory Visit (HOSPITAL_BASED_OUTPATIENT_CLINIC_OR_DEPARTMENT_OTHER): Payer: Self-pay

## 2020-10-02 MED FILL — Evolocumab Subcutaneous Soln Auto-Injector 140 MG/ML: SUBCUTANEOUS | 28 days supply | Qty: 2 | Fill #4 | Status: AC

## 2020-10-09 ENCOUNTER — Other Ambulatory Visit (HOSPITAL_BASED_OUTPATIENT_CLINIC_OR_DEPARTMENT_OTHER): Payer: Self-pay

## 2020-10-09 MED ORDER — COVID-19 MRNA VACC (MODERNA) 100 MCG/0.5ML IM SUSP
INTRAMUSCULAR | 0 refills | Status: DC
  Filled 2020-10-09: qty 0.25, 1d supply, fill #0

## 2020-10-16 NOTE — Progress Notes (Signed)
Cardiology Office Note   Date:  10/17/2020   ID:  Spencer, [name] 1955/06/28, MRN [id number]  PCP:  Burnard Bunting, MD  Cardiologist:   Kathlyn Sacramento, MD   No chief complaint on file.     History of Present Illness: Spencer [name] is a 65 y.o. male who is here today for follow-up visit regarding peripheral arterial disease and CAD. He has known history of coronary artery disease status post CABG in 2012, mild ischemic cardiomyopathy with an EF of 45%, diabetes mellitus, bilateral carotid disease, obesity, hyperlipidemia with intolerance to statins and peripheral arterial disease.  He is status post PCI and drug-eluting stent placement to both SVG to OM and SVG to RCA in 2015.   He had vascular work-up done in 2019. ABI were not reliable due to calcifications.  Angiography in June 2019 showed no significant aortoiliac disease.  There was mild to moderate SFA and popliteal disease.  One-vessel runoff below the knee bilaterally via the peroneal artery which gave collaterals distally to the dorsalis pedis and plantar branches of the posterior tibial artery.  No revascularization was needed.    He has been doing very well with no recent chest pain, shortness of breath or palpitations.  No leg claudication or ulceration.   He goes to the gym 3 times a week and exercises on the treadmill for at least an hour with no significant chest pain or claudication.  He seems to be mostly limited by low back pain which is chronic.    Past Medical History:  Diagnosis Date   Anxiety attack    Arthritis    "back; shoulders; knees" (07/23/2013)   Back pain, chronic    "all over" (07/23/2013)   Benign neoplasm of colon 04/01/2000   Hyperplastic   Coronary artery disease    a. NSTEMI 10/12: s/p CABG with L-LAD, S-OM1/OM2, S-RV marg/RCA;  b. 07/2013 Cath/PCI: LM <10, LAD 70-80p, 56m D1 90, RI 30ost, LCX 40-50p, 911mOM1 90, OM2 100, RCA 100p, VG->OM1->OM2 patent with 90 @ anast (2.5x12 Xience Alpine  DES), RCA 100p, LIMA->LAD nl, VG->Diag nl, VG->RV marginal/RCA 90p (3.0x23 Xience Alpine DES);  c. 07/2013 Echo: EF 55-60%.   Depression    "pain related"   DM2 (diabetes mellitus, type 2) ([facility])    GERD (gastroesophageal reflux disease)    Hyperlipidemia    Hypertension    IBS (irritable bowel syndrome)    Knee pain, bilateral    a. since 01/2013.   [place] [place]   [place]   Ob[place]    Pleural effusion, left 11/2010   Postop; resolved with p.o. diuresis    Past Surgical History:  Procedure Laterality Date   ABDOMINAL AORTOGRAM W/LOWER EXTREMITY N/A 07/30/2017   Procedure: ABDOMINAL AORTOGRAM W/LOWER EXTREMITY;  Surgeon: ArWellington HampshireMD;  [facility];  Service: Cardiovascular;  Laterality: N/A;   BACK SURGERY     CARDIAC CATHETERIZATION  11/2010   CARPAL TUNNEL RELEASE Bilateral    CORONARY ANGIOPLASTY WITH STENT PLACEMENT  07/23/2013   "2"   CORONARY ARTERY BYPASS GRAFT  11/26/10   x 6 SURGEON DR. PETER VAN TRIGT   HERNIA REPAIR     UHR   LEFT HEART CATHETERIZATION WITH CORONARY/GRAFT ANGIOGRAM N/A 07/23/2013   Procedure: LEFT HEART CATHETERIZATION WITH COBeatrix Fetters Surgeon: Peter M JoMartiniqueMD;  [facility];  Service: Cardiovascular;  Laterality: N/A;   LUMBAR FUSION  MULTIPLE; DR. Ellene Route   SHOULDER ARTHROSCOPY W/ ROTATOR CUFF REPAIR Bilateral    UMBILICAL HERNIA REPAIR       Current Outpatient Medications  Medication Sig Dispense Refill   Ascorbic Acid (VITAMIN C) 1000 MG tablet Take 1,000 mg by mouth daily.     aspirin EC 81 MG tablet Take 81 mg by mouth daily.     Blood Glucose Monitoring Suppl (FREESTYLE FREEDOM LITE) w/Device KIT USE AS DIRECTED TO MONITOR BLOOD GLUCOSE 1 kit 6   carvedilol (COREG) 6.25 MG tablet TAKE 1 TABLET BY MOUTH TWICE DAILY 180 tablet 2   cholecalciferol (VITAMIN D) 1000 UNITS tablet Take 1,000 Units by mouth daily.     COVID-19 mRNA vaccine, Moderna, 100 MCG/0.5ML  injection INJECT AS DIRECTED .25 mL 0   COVID-19 mRNA vaccine, Moderna, 100 MCG/0.5ML injection Inject into the muscle. 0.25 mL 0   docusate sodium (COLACE) 100 MG capsule Take 100 mg by mouth 2 (two) times daily.     empagliflozin (JARDIANCE) 25 MG TABS tablet Take 1 tablet by mouth once a day 90 tablet 3   Evolocumab (REPATHA SURECLICK) 353 MG/ML SOAJ Inject 163m every 2 weeks Subcutaneous 30 days 4 mL 11   Evolocumab 140 MG/ML SOAJ INJECT 140 MG INTO THE SKIN EVERY 14 DAYS. 2 mL 11   famotidine (PEPCID) 20 MG tablet Take 20 mg by mouth 2 (two) times daily.     famotidine (PEPCID) 20 MG tablet TAKE 2 TABLETS BY MOUTH TWO TIMES DAILY 360 tablet 3   FREESTYLE LITE test strip      HUMALOG KWIKPEN 100 UNIT/ML KwikPen Inject 4 Units into the skin See admin instructions. With meals as directed     Icosapent Ethyl (VASCEPA) 1 g CAPS Take 2 capsules (2 g total) by mouth 2 (two) times daily. 360 capsule 3   icosapent Ethyl (VASCEPA) 1 g capsule Take 2 capsules by mouth with meals twice a day 360 capsule 3   Insulin Glargine (BASAGLAR KWIKPEN) 100 UNIT/ML Inject 32 Units into the skin daily.     insulin glargine, 2 Unit Dial, (TOUJEO MAX SOLOSTAR) 300 UNIT/ML Solostar Pen Inject 32 units under the skin daily and increase as directed in clinic 45 mL 6   insulin glargine, 2 Unit Dial, (TOUJEO MAX SOLOSTAR) 300 UNIT/ML Solostar Pen Inject 32 units under the skin daily and increase as directed in clinic. Maximum daily dose of 100 units. 15 mL 11   insulin glargine, 2 Unit Dial, (TOUJEO MAX SOLOSTAR) 300 UNIT/ML Solostar Pen inject 32 units Subcutaneous daily and increase as directed in clinic up to 100u. 12 mL 11   insulin glargine, 2 Unit Dial, (TOUJEO MAX) 300 UNIT/ML Solostar Pen INJECT 32 UNITS UNDER THE SKIN DAILY AND INCREASE AS DIRECTED IN CLINIC 3 mL 6   insulin lispro (HUMALOG KWIKPEN) 100 UNIT/ML KwikPen Inject 4 units Subcutaneous with meals 12 mL 3   Insulin Pen Needle 32G X 6 MM MISC Use with  Victoza daily. 100 each 3   JARDIANCE 25 MG TABS tablet Take 25 mg by mouth daily.   1   lisinopril (PRINIVIL,ZESTRIL) 40 MG tablet Take 1 tablet (40 mg total) by mouth daily. 30 tablet 6   lisinopril (ZESTRIL) 40 MG tablet TAKE 1 TABLET BY MOUTH ONCE DAILY 90 tablet 2   lisinopril (ZESTRIL) 40 MG tablet 1 tablet Orally Once a day 90 days 90 tablet 3   metFORMIN (GLUCOPHAGE-XR) 500 MG 24 hr tablet Take 2 tablets (1,000 mg total)  by mouth 2 (two) times daily. 0530 & 1730  3   metFORMIN (GLUCOPHAGE-XR) 500 MG 24 hr tablet Take 1 tablet by mouth twice a day 180 tablet 3   methocarbamol (ROBAXIN) 500 MG tablet Take 500 mg by mouth every 8 (eight) hours. scheduled     methocarbamol (ROBAXIN) 500 MG tablet TAKE ONE TABLET BY MOUTH THREE TIMES DAILY AS NEEDED 270 tablet 2   metroNIDAZOLE (METROGEL) 1 % gel Apply 1 application topically daily as needed (rosacea).     metroNIDAZOLE (METROGEL) 1 % gel APPLY TO AFFECTED AREAS EXTERNALLY DAILY 60 g 6   nitroGLYCERIN (NITROSTAT) 0.4 MG SL tablet Place 1 tablet (0.4 mg total) under the tongue every 5 (five) minutes as needed for chest pain (CP or SOB). 25 tablet 3   oxycodone (OXY-IR) 5 MG capsule Take 1 capsule (5 mg total) by mouth every 4 (four) hours as needed. 15 capsule 0   PENTIPS 31G X 5 MM MISC      polyethylene glycol powder (GLYCOLAX/MIRALAX) 17 GM/SCOOP powder Take 17 g by mouth daily as needed (constipation).     prasugrel (EFFIENT) 10 MG TABS tablet Take 1 tablet by mouth daily.  3   prasugrel (EFFIENT) 10 MG TABS tablet TAKE 1 TABLET BY MOUTH ONCE DAILY 90 tablet 2   prasugrel (EFFIENT) 10 MG TABS tablet Take 1 tablet by mouth once a day 90 tablet 3   pregabalin (LYRICA) 150 MG capsule Take 150 mg by mouth 2 (two) times daily. 0530 & 1730     pregabalin (LYRICA) 150 MG capsule take 1 capsule by mouth twice a day 180 capsule 1   Probiotic Product (PROBIOTIC PO) Take 1 each by mouth daily. Keifer liquid yogurt probiotic     pyridOXINE (VITAMIN  B-6) 100 MG tablet Take 100 mg by mouth daily.     senna-docusate (SENOKOT-S) 8.6-50 MG tablet Take 1 tablet by mouth at bedtime as needed for mild constipation. 30 tablet 0   sertraline (ZOLOFT) 50 MG tablet Take 50 mg by mouth 2 (two) times daily. 0530 & 1730     sertraline (ZOLOFT) 50 MG tablet TAKE 1 TABLET BY MOUTH TWICE DAILY 180 tablet 2   traMADol (ULTRAM) 50 MG tablet Take 100 mg by mouth in the morning, at noon, and at bedtime. scheduled     traMADol (ULTRAM) 50 MG tablet TAKE 1 TO 2 TABLETS BY MOUTH EVERY 6 HOURS AS NEEDED 720 tablet 1   zolpidem (AMBIEN) 10 MG tablet Take 5 mg by mouth at bedtime.     zolpidem (AMBIEN) 10 MG tablet TAKE 1 TABLET BY MOUTH AT BEDTIME AS NEEDED FOR INSOMNIA 30 tablet 2   zolpidem (AMBIEN) 10 MG tablet TAKE 1 TABLET BY MOUTH AT BEDTIME AS NEEDED FOR INSOMNIA 90 tablet 1   icosapent Ethyl (VASCEPA) 1 g capsule TAKE 2 CAPSULES BY MOUTH TWO TIMES DAILY 360 capsule 3   Insulin Glargine (BASAGLAR KWIKPEN) 100 UNIT/ML INJECT 27 UNITS UNDER THE SKIN ONCE DAILY 30 mL 2   lisinopril (ZESTRIL) 40 MG tablet TAKE 1 TABLET BY MOUTH ONCE DAILY 90 tablet 2   metFORMIN (GLUCOPHAGE-XR) 500 MG 24 hr tablet TAKE 2 TABLETS BY MOUTH TWICE DAILY 360 tablet 3   pregabalin (LYRICA) 150 MG capsule TAKE 1 CAPSULE BY MOUTH TWICE DAILY 180 capsule 1   No current facility-administered medications for this visit.    Allergies:   Victoza [liraglutide], Brilinta [ticagrelor], Codeine, Other, and Statins    Social History:  The patient  reports that he has never smoked. He has never used smokeless tobacco. He reports that he does not drink alcohol and does not use drugs.   Family History:  The patient's family history is not on file.    ROS:  Please see the history of present illness.   Otherwise, review of systems are positive for none.   All other systems are reviewed and negative.    PHYSICAL EXAM: VS:  BP (!) 150/80 (BP Location: Left Arm)   Pulse 68   Ht _0  (1.854 m)    Wt 280 lb 3.2 oz (127.1 kg)   SpO2 98%   BMI 36.97 kg/m  , BMI Body mass index is 36.97 kg/m. GEN: Well nourished, well developed, in no acute distress  HEENT: normal  Neck: no JVD, carotid bruits, or masses Cardiac: RRR; no murmurs, rubs, or gallops,no edema  Respiratory:  clear to auscultation bilaterally, normal work of breathing GI: soft, nontender, nondistended, + BS MS: no deformity or atrophy  Skin: warm and dry, no rash Neuro:  Strength and sensation are intact Psych: euthymic mood, full affect Vascular: Femoral pulses are normal bilaterally.    EKG:  EKG is ordered today. EKG showed normal sinus rhythm with incomplete right bundle branch block.   Recent Labs: 11/19/2019: ALT 28; Hemoglobin 14.4; Platelets 209 11/20/2019: BUN 19; Creatinine, Ser 0.90; Potassium 4.1; Sodium 137    Lipid Panel    Component Value Date/Time   CHOL 135 12/23/2017 0907   TRIG 223 (H) 12/23/2017 0907   HDL 35 (L) 12/23/2017 0907   CHOLHDL 3.9 12/23/2017 0907   CHOLHDL 7.0 07/23/2013 0155   VLDL 59 (H) 07/23/2013 0155   LDLCALC 55 12/23/2017 0907      Wt Readings from Last 3 Encounters:  10/17/20 280 lb 3.2 oz (127.1 kg)  04/04/20 277 lb 9.6 oz (125.9 kg)  01/31/20 289 lb (131.1 kg)       No flowsheet data found.    ASSESSMENT AND PLAN:  1.  Peripheral arterial disease: The patient has one-vessel runoff below the knee bilaterally via the peroneal artery with no other obstructive disease.  Currently with no claudication or critical limb ischemia. No revascularization is needed.  Continue aggressive medical therapy.   2.  Coronary artery disease involving native coronary arteries without angina: He continues to be on dual antiplatelet therapy with aspirin and prasugrel given previous PCI on vein grafts.   No bleeding issues.   3.  Bilateral carotid disease: Most recent carotid Doppler done in February 2022 showed 40 to 50% stenosis in the right ICA and 60 to 79% stenosis in  the left ICA.  Requested a follow-up carotid Doppler to be done in February 2023.     4.  Essential hypertension: His blood pressure is elevated today but he reports that he was rushing to the appointment due to traffic.  Continue to monitor for now and consider increasing the dose of carvedilol if blood pressure continues to be elevated.  5.  Mixed hyperlipidemia: He is intolerant to all statins due to severe myalgia.   Continue treatment with Repatha and Vascepa.   This is managed by his primary care physician.   Disposition:   FU with me in 6 months  Signed,  Kathlyn Sacramento, MD  10/17/2020 8:44 AM    [place]

## 2020-10-17 ENCOUNTER — Other Ambulatory Visit: Payer: Self-pay

## 2020-10-17 ENCOUNTER — Ambulatory Visit (INDEPENDENT_AMBULATORY_CARE_PROVIDER_SITE_OTHER): Payer: 59 | Admitting: Cardiovascular Disease

## 2020-10-17 ENCOUNTER — Encounter: Payer: Self-pay | Admitting: Cardiovascular Disease

## 2020-10-17 VITALS — BP 150/80 | HR 68 | Ht 73.0 in | Wt 280.2 lb

## 2020-10-17 DIAGNOSIS — I779 Disorder of arteries and arterioles, unspecified: Secondary | ICD-10-CM | POA: Diagnosis not present

## 2020-10-17 DIAGNOSIS — I739 Peripheral vascular disease, unspecified: Secondary | ICD-10-CM

## 2020-10-17 DIAGNOSIS — I6523 Occlusion and stenosis of bilateral carotid arteries: Secondary | ICD-10-CM

## 2020-10-17 DIAGNOSIS — E782 Mixed hyperlipidemia: Secondary | ICD-10-CM

## 2020-10-17 DIAGNOSIS — I10 Essential (primary) hypertension: Secondary | ICD-10-CM | POA: Diagnosis not present

## 2020-10-17 DIAGNOSIS — I2510 Atherosclerotic heart disease of native coronary artery without angina pectoris: Secondary | ICD-10-CM | POA: Diagnosis not present

## 2020-10-17 NOTE — Patient Instructions (Addendum)
  Medication Instructions:  None  *If you need a refill on your cardiac medications before your next appointment, please call your pharmacy*   Lab Work: none If you have labs (blood work) drawn today and your tests are completely normal, you will receive your results only by: [place] (if you have MyChart) OR A paper copy in the mail If you have any lab test that is abnormal or we need to change your treatment, we will call you to review the results.   Testing/Procedures: Your physician has requested that you have a carotid duplex. This test is an ultrasound of the carotid arteries in your neck. It looks at blood flow through these arteries that supply the brain with blood. Allow one hour for this exam. There are no restrictions or special instructions. FEBRUARY 2023   Follow-Up: At [facility], you and your health needs are our priority.  As part of our continuing mission to provide you with exceptional heart care, we have created designated Provider Care Teams.  These Care Teams include your primary Cardiologist (physician) and Advanced Practice Providers (APPs -  Physician Assistants and Nurse Practitioners) who all work together to provide you with the care you need, when you need it.  We recommend signing up for the patient portal called "MyChart".  Sign up information is provided on this After Visit Summary.  MyChart is used to connect with patients for Virtual Visits (Telemedicine).  Patients are able to view lab/test results, encounter notes, upcoming appointments, etc.  Non-urgent messages can be sent to your provider as well.   To learn more about what you can do with MyChart, go to [email or web address].    Your next appointment:   6 month(s)  The format for your next appointment:   In Person  Provider:   Kathlyn [place], MD   Other Instructions NONE

## 2020-10-25 ENCOUNTER — Other Ambulatory Visit (HOSPITAL_BASED_OUTPATIENT_CLINIC_OR_DEPARTMENT_OTHER): Payer: Self-pay

## 2020-10-26 ENCOUNTER — Other Ambulatory Visit (HOSPITAL_BASED_OUTPATIENT_CLINIC_OR_DEPARTMENT_OTHER): Payer: Self-pay

## 2020-10-27 ENCOUNTER — Other Ambulatory Visit (HOSPITAL_BASED_OUTPATIENT_CLINIC_OR_DEPARTMENT_OTHER): Payer: Self-pay

## 2020-10-27 MED ORDER — ZOLPIDEM TARTRATE 10 MG PO TABS
ORAL_TABLET | ORAL | 0 refills | Status: AC
  Filled 2020-10-27: qty 30, 30d supply, fill #0

## 2020-10-27 MED FILL — Evolocumab Subcutaneous Soln Auto-Injector 140 MG/ML: SUBCUTANEOUS | 28 days supply | Qty: 2 | Fill #5 | Status: AC

## 2020-10-30 ENCOUNTER — Other Ambulatory Visit (HOSPITAL_BASED_OUTPATIENT_CLINIC_OR_DEPARTMENT_OTHER): Payer: Self-pay

## 2020-11-03 IMAGING — CT CT ABD-PELV W/ CM
2 of 5 series · 14 of 46 positions shown, 16 images · IV contrast (iopamidol)
Comparison: 08/12/2015

CLINICAL DATA: Left-sided abdominal pain

EXAM:
CT ABDOMEN AND PELVIS WITH CONTRAST
TECHNIQUE: Multidetector CT imaging of the abdomen and pelvis was performed
using the standard protocol following bolus administration of
intravenous contrast.
CONTRAST:  100mL 5Z2GXY-TAA IOPAMIDOL (5Z2GXY-TAA) INJECTION 61%

[Series 2: abd pelvis 5.00 br40 s3 axial · axial · 0.80mm/px · z∈[+1169,+1609]mm · 11 of 100 slices shown, 13 images]
[im 6/100  soft-tissue]
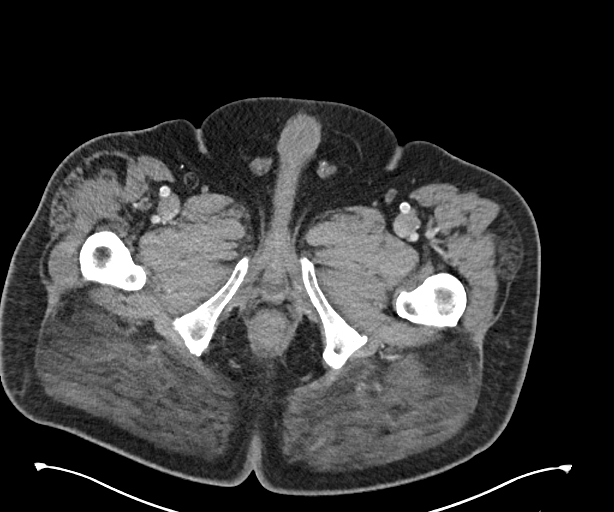
[im 6/100  bone]
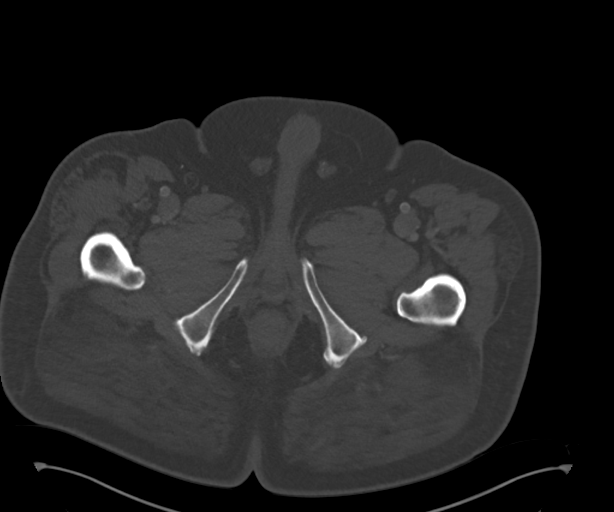
[im 18/100  soft-tissue]
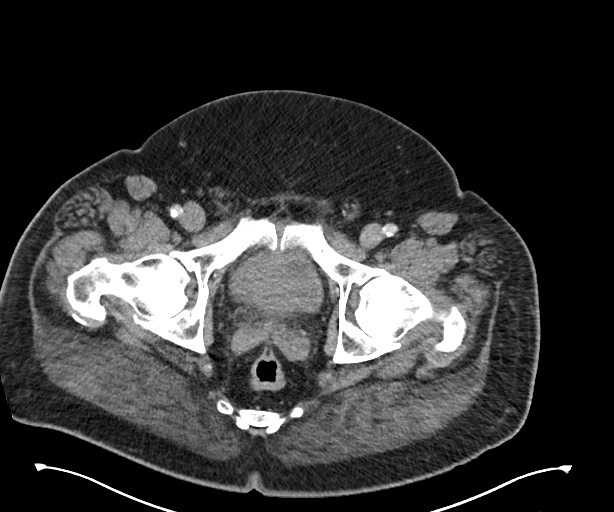
[im 24/100  soft-tissue]
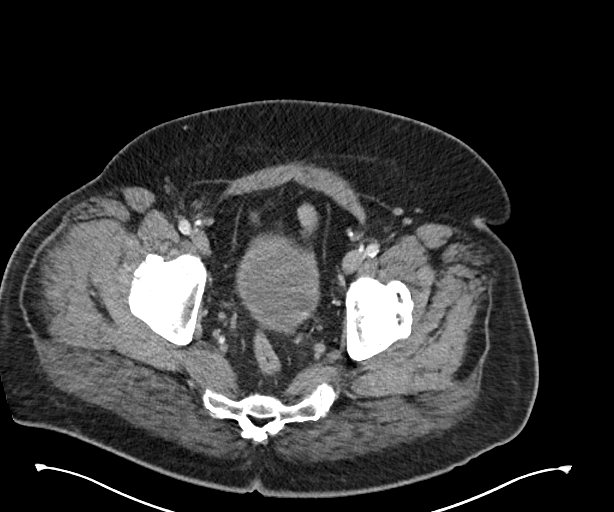
[im 35/100  soft-tissue]
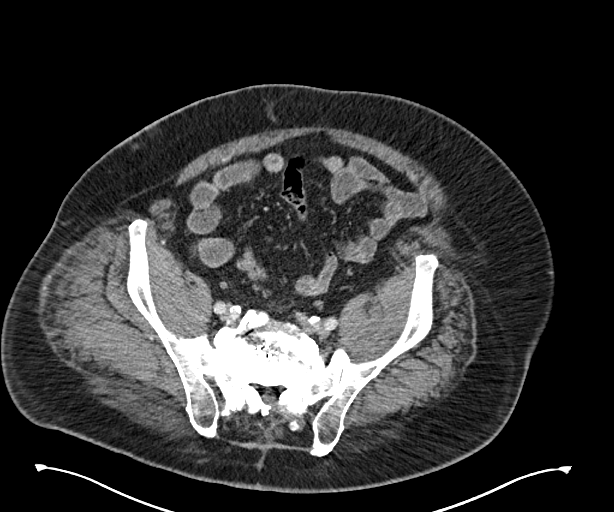
[im 41/100  soft-tissue]
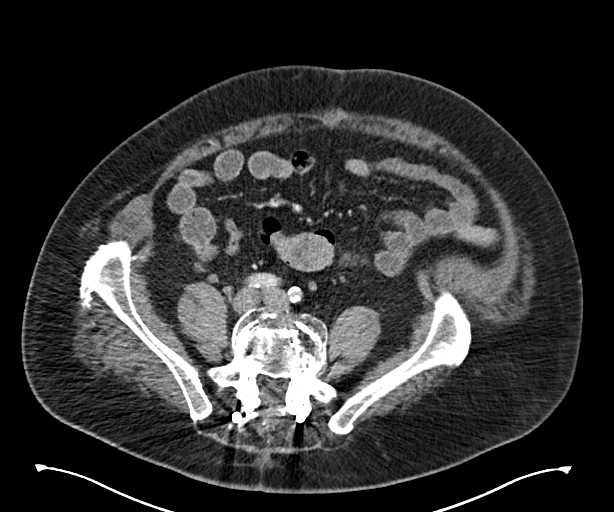
[im 53/100  soft-tissue]
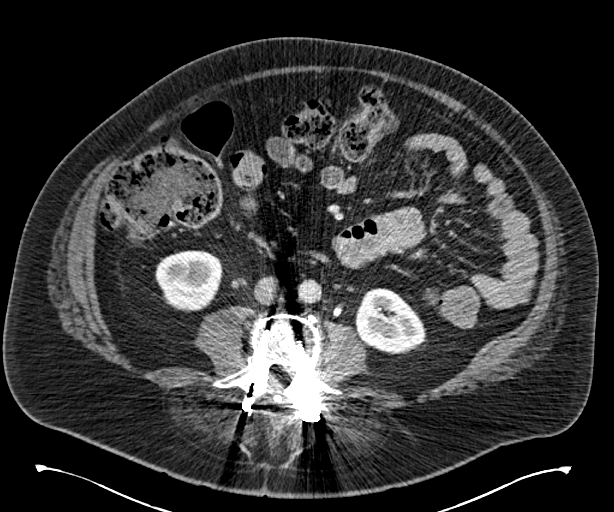
[im 59/100  soft-tissue]
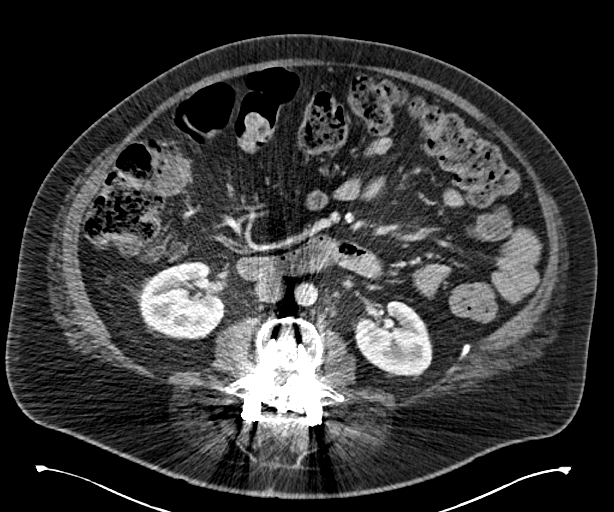
[im 65/100  soft-tissue]
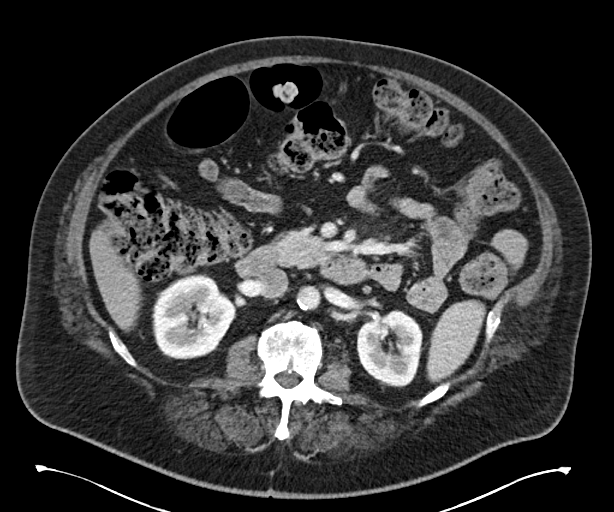
[im 76/100  soft-tissue]
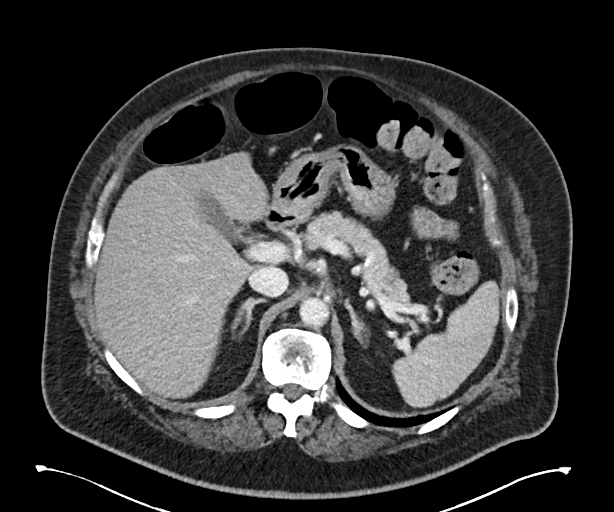
[im 76/100  bone]
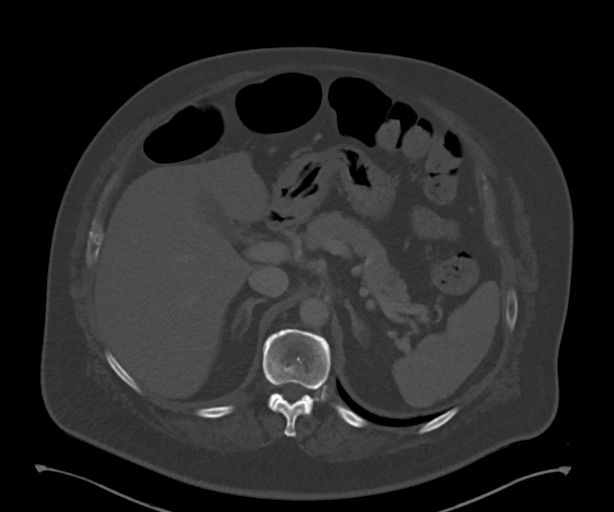
[im 82/100  soft-tissue]
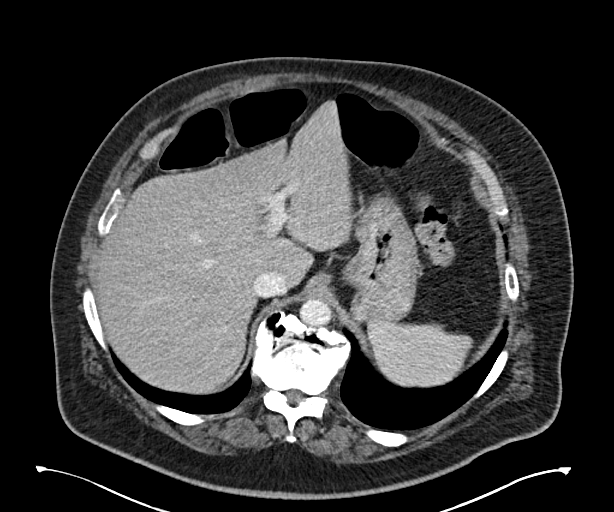
[im 94/100  soft-tissue]
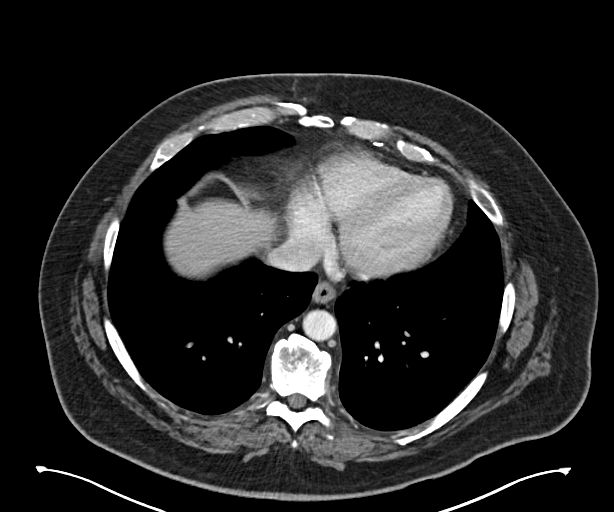

[Series 6: abd pelvis 2.00 br40 s3 cor · coronal · 0.97mm/px · 3 of 212 slices shown]
[im 71/212  soft-tissue]
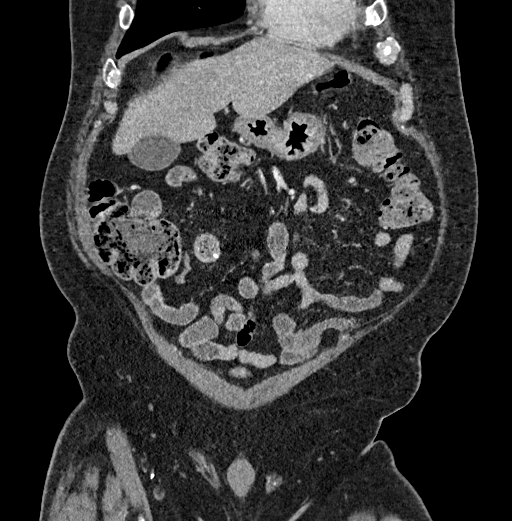
[im 94/212  soft-tissue]
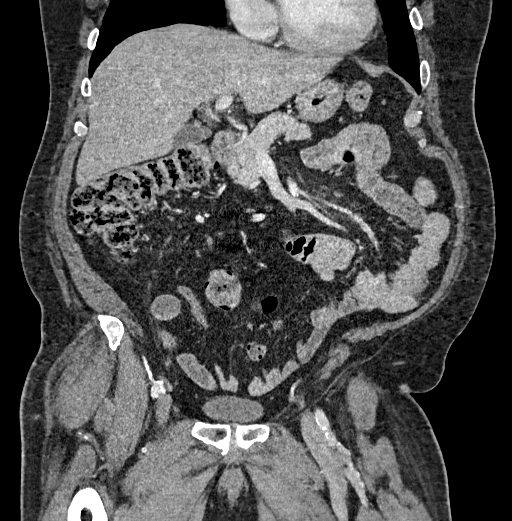
[im 118/212  soft-tissue]
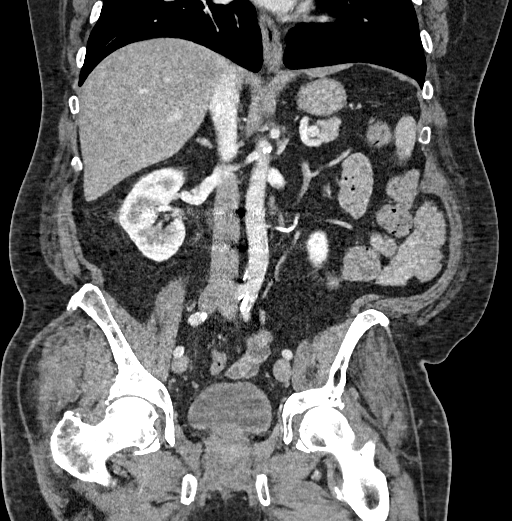

[14 of 46 positions shown; findings below may reference images not displayed]

FINDINGS: Lower chest: No acute abnormality.

Hepatobiliary: No focal liver abnormality is seen. No gallstones,
gallbladder wall thickening, or biliary dilatation.

Pancreas: Unremarkable. No pancreatic ductal dilatation or
surrounding inflammatory changes.

Spleen: Normal in size without focal abnormality.

Adrenals/Urinary Tract: Unremarkable adrenal glands. Symmetric
enhancement of the bilateral kidneys. Numerous punctate 2-3 mm
calculi within the bilateral kidneys. No hydronephrosis. Ureters are
unremarkable. No ureteral calculi. Urinary bladder is within normal
limits for the degree of distension.

Stomach/Bowel: Stomach is within normal limits. Appendix not
definitively visualized. Moderate volume of stool throughout the
colon. No evidence of bowel wall thickening, distention, or
inflammatory changes.

Vascular/Lymphatic: Moderate degree of aortoiliac atherosclerotic
calcifications without aneurysm. No abdominopelvic lymphadenopathy.

Reproductive: Prostate is unremarkable.

Other: No free fluid. No abdominopelvic fluid collection. No
pneumoperitoneum. Prior umbilical hernia repair. No abdominal wall
hernia.

Musculoskeletal: Prior L2-S1 posterior and interbody fusion. No new
or acute osseous findings.
IMPRESSION: 1. No acute abdominopelvic findings.
2. Bilateral nonobstructing nephrolithiasis.
3. Moderate volume of stool throughout the colon.
4. Aortic atherosclerosis. ([id number]).

## 2020-11-08 ENCOUNTER — Other Ambulatory Visit (HOSPITAL_BASED_OUTPATIENT_CLINIC_OR_DEPARTMENT_OTHER): Payer: Self-pay

## 2020-11-08 MED ORDER — INFLUENZA VAC SPLIT QUAD 0.5 ML IM SUSY
PREFILLED_SYRINGE | INTRAMUSCULAR | 0 refills | Status: DC
  Filled 2020-11-08: qty 0.5, 1d supply, fill #0

## 2020-11-13 DIAGNOSIS — N35011 Post-traumatic bulbous urethral stricture: Secondary | ICD-10-CM | POA: Diagnosis not present

## 2020-11-13 DIAGNOSIS — N200 Calculus of kidney: Secondary | ICD-10-CM | POA: Diagnosis not present

## 2020-11-22 ENCOUNTER — Other Ambulatory Visit (HOSPITAL_BASED_OUTPATIENT_CLINIC_OR_DEPARTMENT_OTHER): Payer: Self-pay

## 2020-11-22 MED ORDER — CIPROFLOXACIN HCL 500 MG PO TABS
500.0000 mg | ORAL_TABLET | Freq: Two times a day (BID) | ORAL | 0 refills | Status: DC
  Filled 2020-11-22: qty 14, 7d supply, fill #0

## 2020-11-22 MED ORDER — ZOLPIDEM TARTRATE 10 MG PO TABS
10.0000 mg | ORAL_TABLET | Freq: Every evening | ORAL | 3 refills | Status: DC | PRN
  Filled 2020-11-22 – 2020-11-29 (×2): qty 90, 90d supply, fill #0

## 2020-11-22 MED ORDER — AZITHROMYCIN 250 MG PO TABS
ORAL_TABLET | ORAL | 0 refills | Status: DC
  Filled 2020-11-22: qty 6, 5d supply, fill #0

## 2020-11-22 MED ORDER — METHOCARBAMOL 500 MG PO TABS
500.0000 mg | ORAL_TABLET | Freq: Three times a day (TID) | ORAL | 3 refills | Status: AC | PRN
  Filled 2020-11-22: qty 270, 90d supply, fill #0
  Filled 2021-02-27: qty 270, 90d supply, fill #1
  Filled 2021-05-29: qty 270, 90d supply, fill #2

## 2020-11-22 MED ORDER — TRAMADOL HCL 50 MG PO TABS
50.0000 mg | ORAL_TABLET | Freq: Four times a day (QID) | ORAL | 3 refills | Status: DC | PRN
  Filled 2020-11-22: qty 720, 90d supply, fill #0
  Filled 2021-02-27: qty 720, 90d supply, fill #1

## 2020-11-22 MED FILL — Evolocumab Subcutaneous Soln Auto-Injector 140 MG/ML: SUBCUTANEOUS | 28 days supply | Qty: 2 | Fill #6 | Status: AC

## 2020-11-29 ENCOUNTER — Other Ambulatory Visit (HOSPITAL_BASED_OUTPATIENT_CLINIC_OR_DEPARTMENT_OTHER): Payer: Self-pay

## 2020-11-29 MED ORDER — PREGABALIN 150 MG PO CAPS: ORAL_CAPSULE | ORAL | 0 refills | Status: DC

## 2020-11-29 MED ORDER — PREGABALIN 150 MG PO CAPS
ORAL_CAPSULE | ORAL | 0 refills | Status: DC
  Filled 2020-11-29: qty 60, 30d supply, fill #0

## 2020-11-30 ENCOUNTER — Other Ambulatory Visit (HOSPITAL_BASED_OUTPATIENT_CLINIC_OR_DEPARTMENT_OTHER): Payer: Self-pay

## 2020-12-13 ENCOUNTER — Other Ambulatory Visit (HOSPITAL_BASED_OUTPATIENT_CLINIC_OR_DEPARTMENT_OTHER): Payer: Self-pay

## 2020-12-13 DIAGNOSIS — E669 Obesity, unspecified: Secondary | ICD-10-CM | POA: Diagnosis not present

## 2020-12-13 DIAGNOSIS — I10 Essential (primary) hypertension: Secondary | ICD-10-CM | POA: Diagnosis not present

## 2020-12-13 DIAGNOSIS — I2510 Atherosclerotic heart disease of native coronary artery without angina pectoris: Secondary | ICD-10-CM | POA: Diagnosis not present

## 2020-12-13 DIAGNOSIS — G629 Polyneuropathy, unspecified: Secondary | ICD-10-CM | POA: Diagnosis not present

## 2020-12-13 DIAGNOSIS — E1151 Type 2 diabetes mellitus with diabetic peripheral angiopathy without gangrene: Secondary | ICD-10-CM | POA: Diagnosis not present

## 2020-12-13 DIAGNOSIS — E1140 Type 2 diabetes mellitus with diabetic neuropathy, unspecified: Secondary | ICD-10-CM | POA: Diagnosis not present

## 2020-12-13 DIAGNOSIS — I739 Peripheral vascular disease, unspecified: Secondary | ICD-10-CM | POA: Diagnosis not present

## 2020-12-13 MED ORDER — PREGABALIN 150 MG PO CAPS
150.0000 mg | ORAL_CAPSULE | Freq: Two times a day (BID) | ORAL | 3 refills | Status: AC
  Filled 2020-12-13 – 2021-01-02 (×2): qty 180, 90d supply, fill #0
  Filled 2021-04-04: qty 180, 90d supply, fill #1

## 2021-01-02 ENCOUNTER — Other Ambulatory Visit (HOSPITAL_BASED_OUTPATIENT_CLINIC_OR_DEPARTMENT_OTHER): Payer: Self-pay

## 2021-01-03 ENCOUNTER — Other Ambulatory Visit (HOSPITAL_BASED_OUTPATIENT_CLINIC_OR_DEPARTMENT_OTHER): Payer: Self-pay

## 2021-01-09 ENCOUNTER — Other Ambulatory Visit (HOSPITAL_BASED_OUTPATIENT_CLINIC_OR_DEPARTMENT_OTHER): Payer: Self-pay

## 2021-01-10 ENCOUNTER — Other Ambulatory Visit (HOSPITAL_BASED_OUTPATIENT_CLINIC_OR_DEPARTMENT_OTHER): Payer: Self-pay

## 2021-01-18 DIAGNOSIS — N35011 Post-traumatic bulbous urethral stricture: Secondary | ICD-10-CM | POA: Diagnosis not present

## 2021-01-19 ENCOUNTER — Other Ambulatory Visit (HOSPITAL_BASED_OUTPATIENT_CLINIC_OR_DEPARTMENT_OTHER): Payer: Self-pay

## 2021-01-19 MED FILL — Evolocumab Subcutaneous Soln Auto-Injector 140 MG/ML: SUBCUTANEOUS | 28 days supply | Qty: 2 | Fill #7 | Status: AC

## 2021-01-24 ENCOUNTER — Other Ambulatory Visit (HOSPITAL_BASED_OUTPATIENT_CLINIC_OR_DEPARTMENT_OTHER): Payer: Self-pay

## 2021-01-27 ENCOUNTER — Other Ambulatory Visit: Payer: Self-pay

## 2021-01-27 ENCOUNTER — Encounter (HOSPITAL_COMMUNITY): Payer: Self-pay

## 2021-01-27 ENCOUNTER — Emergency Department (HOSPITAL_COMMUNITY): Payer: 59

## 2021-01-27 ENCOUNTER — Other Ambulatory Visit (HOSPITAL_COMMUNITY): Payer: 59

## 2021-01-27 ENCOUNTER — Inpatient Hospital Stay (HOSPITAL_COMMUNITY)
Admission: EM | Admit: 2021-01-27 | Discharge: 2021-01-30 | DRG: 287 | Disposition: A | Discharge status: Home / Self Care | Payer: 59 | Attending: Cardiology | Admitting: Cardiology

## 2021-01-27 DIAGNOSIS — I200 Unstable angina: Secondary | ICD-10-CM | POA: Diagnosis not present

## 2021-01-27 DIAGNOSIS — Z20822 Contact with and (suspected) exposure to covid-19: Secondary | ICD-10-CM | POA: Diagnosis present

## 2021-01-27 DIAGNOSIS — Z8249 Family history of ischemic heart disease and other diseases of the circulatory system: Secondary | ICD-10-CM | POA: Diagnosis not present

## 2021-01-27 DIAGNOSIS — Z9114 Patient's other noncompliance with medication regimen: Secondary | ICD-10-CM

## 2021-01-27 DIAGNOSIS — E785 Hyperlipidemia, unspecified: Secondary | ICD-10-CM | POA: Diagnosis present

## 2021-01-27 DIAGNOSIS — I25700 Atherosclerosis of coronary artery bypass graft(s), unspecified, with unstable angina pectoris: Secondary | ICD-10-CM | POA: Diagnosis present

## 2021-01-27 DIAGNOSIS — I5032 Chronic diastolic (congestive) heart failure: Secondary | ICD-10-CM | POA: Diagnosis present

## 2021-01-27 DIAGNOSIS — Z955 Presence of coronary angioplasty implant and graft: Secondary | ICD-10-CM

## 2021-01-27 DIAGNOSIS — Z888 Allergy status to other drugs, medicaments and biological substances status: Secondary | ICD-10-CM | POA: Diagnosis not present

## 2021-01-27 DIAGNOSIS — Z7984 Long term (current) use of oral hypoglycemic drugs: Secondary | ICD-10-CM | POA: Diagnosis not present

## 2021-01-27 DIAGNOSIS — E1159 Type 2 diabetes mellitus with other circulatory complications: Secondary | ICD-10-CM | POA: Diagnosis present

## 2021-01-27 DIAGNOSIS — I252 Old myocardial infarction: Secondary | ICD-10-CM | POA: Diagnosis not present

## 2021-01-27 DIAGNOSIS — Z7982 Long term (current) use of aspirin: Secondary | ICD-10-CM

## 2021-01-27 DIAGNOSIS — Z885 Allergy status to narcotic agent status: Secondary | ICD-10-CM

## 2021-01-27 DIAGNOSIS — Z794 Long term (current) use of insulin: Secondary | ICD-10-CM

## 2021-01-27 DIAGNOSIS — E119 Type 2 diabetes mellitus without complications: Secondary | ICD-10-CM | POA: Diagnosis not present

## 2021-01-27 DIAGNOSIS — I152 Hypertension secondary to endocrine disorders: Secondary | ICD-10-CM | POA: Diagnosis present

## 2021-01-27 DIAGNOSIS — K5900 Constipation, unspecified: Secondary | ICD-10-CM | POA: Diagnosis not present

## 2021-01-27 DIAGNOSIS — I25710 Atherosclerosis of autologous vein coronary artery bypass graft(s) with unstable angina pectoris: Secondary | ICD-10-CM | POA: Diagnosis not present

## 2021-01-27 DIAGNOSIS — E782 Mixed hyperlipidemia: Secondary | ICD-10-CM | POA: Diagnosis not present

## 2021-01-27 DIAGNOSIS — Z79899 Other long term (current) drug therapy: Secondary | ICD-10-CM | POA: Diagnosis not present

## 2021-01-27 DIAGNOSIS — K219 Gastro-esophageal reflux disease without esophagitis: Secondary | ICD-10-CM | POA: Diagnosis present

## 2021-01-27 DIAGNOSIS — E669 Obesity, unspecified: Secondary | ICD-10-CM | POA: Diagnosis present

## 2021-01-27 DIAGNOSIS — Z79620 Long term (current) use of immunosuppressive biologic: Secondary | ICD-10-CM | POA: Diagnosis not present

## 2021-01-27 DIAGNOSIS — Z6836 Body mass index (BMI) 36.0-36.9, adult: Secondary | ICD-10-CM

## 2021-01-27 DIAGNOSIS — G8929 Other chronic pain: Secondary | ICD-10-CM | POA: Diagnosis present

## 2021-01-27 DIAGNOSIS — I25110 Atherosclerotic heart disease of native coronary artery with unstable angina pectoris: Principal | ICD-10-CM | POA: Diagnosis present

## 2021-01-27 DIAGNOSIS — E1169 Type 2 diabetes mellitus with other specified complication: Secondary | ICD-10-CM | POA: Diagnosis present

## 2021-01-27 DIAGNOSIS — R079 Chest pain, unspecified: Secondary | ICD-10-CM | POA: Diagnosis not present

## 2021-01-27 LAB — RESP PANEL BY RT-PCR (FLU A&B, COVID) ARPGX2
Influenza A by PCR: NEGATIVE
Influenza B by PCR: NEGATIVE
SARS Coronavirus 2 by RT PCR: NEGATIVE

## 2021-01-27 LAB — BASIC METABOLIC PANEL
Anion gap: 10 (ref 5–15)
BUN: 24 mg/dL — ABNORMAL HIGH (ref 8–23)
CO2: 23 mmol/L (ref 22–32)
Calcium: 9 mg/dL (ref 8.9–10.3)
Chloride: 103 mmol/L (ref 98–111)
Creatinine, Ser: 0.88 mg/dL (ref 0.61–1.24)
GFR, Estimated: >60 mL/min (ref >60)
Glucose, Bld: 169 mg/dL — ABNORMAL HIGH (ref 70–99)
Potassium: 4.9 mmol/L (ref 3.5–5.1)
Sodium: 136 mmol/L (ref 135–145)

## 2021-01-27 LAB — HEPATIC FUNCTION PANEL
ALT: 22 U/L (ref 0–44)
AST: 26 U/L (ref 15–41)
Albumin: 3.6 g/dL (ref 3.5–5.0)
Alkaline Phosphatase: 62 U/L (ref 38–126)
Bilirubin, Direct: 0.2 mg/dL (ref 0.0–0.2)
Indirect Bilirubin: 0.5 mg/dL (ref 0.3–0.9)
Total Bilirubin: 0.7 mg/dL (ref 0.3–1.2)
Total Protein: 7.3 g/dL (ref 6.5–8.1)

## 2021-01-27 LAB — CBC WITH DIFFERENTIAL/PLATELET
Abs Immature Granulocytes: 0.02 10*3/uL (ref 0.00–0.07)
Basophils Absolute: 0.1 10*3/uL (ref 0.0–0.1)
Basophils Relative: 1 %
Eosinophils Absolute: 0.2 10*3/uL (ref 0.0–0.5)
Eosinophils Relative: 3 %
HCT: 45.6 % (ref 39.0–52.0)
Hemoglobin: 14.5 g/dL (ref 13.0–17.0)
Immature Granulocytes: 0 %
Lymphocytes Relative: 25 %
Lymphs Abs: 1.6 10*3/uL (ref 0.7–4.0)
MCH: 28.9 pg (ref 26.0–34.0)
MCHC: 31.8 g/dL (ref 30.0–36.0)
MCV: 91 fL (ref 80.0–100.0)
Monocytes Absolute: 0.6 10*3/uL (ref 0.1–1.0)
Monocytes Relative: 9 %
Neutro Abs: 3.9 10*3/uL (ref 1.7–7.7)
Neutrophils Relative %: 62 %
Platelets: 235 10*3/uL (ref 150–400)
RBC: 5.01 MIL/uL (ref 4.22–5.81)
RDW: 12.9 % (ref 11.5–15.5)
WBC: 6.3 10*3/uL (ref 4.0–10.5)
nRBC: 0 % (ref 0.0–0.2)

## 2021-01-27 LAB — GLUCOSE, CAPILLARY
Glucose-Capillary: 148 mg/dL — ABNORMAL HIGH (ref 70–99)
Glucose-Capillary: 137 mg/dL — ABNORMAL HIGH (ref 70–99)

## 2021-01-27 LAB — TROPONIN I (HIGH SENSITIVITY)
Troponin I (High Sensitivity): 80 ng/L — ABNORMAL HIGH (ref <18)
Troponin I (High Sensitivity): 74 ng/L — ABNORMAL HIGH (ref <18)
Troponin I (High Sensitivity): 68 ng/L — ABNORMAL HIGH (ref <18)

## 2021-01-27 LAB — HEMOGLOBIN A1C
Hgb A1c MFr Bld: 8.3 % — ABNORMAL HIGH (ref 4.8–5.6)
Mean Plasma Glucose: 191.51 mg/dL

## 2021-01-27 LAB — MAGNESIUM: Magnesium: 2 mg/dL (ref 1.7–2.4)

## 2021-01-27 LAB — T4, FREE: Free T4: 0.9 ng/dL (ref 0.61–1.12)

## 2021-01-27 LAB — HEPARIN LEVEL (UNFRACTIONATED): Heparin Unfractionated: 0.12 IU/mL — ABNORMAL LOW (ref 0.30–0.70)

## 2021-01-27 LAB — TSH: TSH: 1.925 u[IU]/mL (ref 0.350–4.500)

## 2021-01-27 LAB — HIV ANTIBODY (ROUTINE TESTING W REFLEX): HIV Screen 4th Generation wRfx: NONREACTIVE

## 2021-01-27 MED ORDER — SODIUM CHLORIDE 0.9% FLUSH
3.0000 mL | Freq: Two times a day (BID) | INTRAVENOUS | Status: DC
  Administered 2021-01-28 – 2021-01-30 (×4): 3 mL via INTRAVENOUS

## 2021-01-27 MED ORDER — ASPIRIN 300 MG RE SUPP: 300.0000 mg | RECTAL | Status: DC

## 2021-01-27 MED ORDER — SENNOSIDES-DOCUSATE SODIUM 8.6-50 MG PO TABS
1.0000 | ORAL_TABLET | Freq: Every evening | ORAL | Status: DC | PRN
  Filled 2021-01-27: qty 1

## 2021-01-27 MED ORDER — INSULIN ASPART 100 UNIT/ML IJ SOLN
0.0000 [IU] | Freq: Three times a day (TID) | INTRAMUSCULAR | Status: DC
Start: 2021-01-27 — End: 2021-01-30
  Administered 2021-01-27: 2 [IU] via SUBCUTANEOUS
  Administered 2021-01-28: 3 [IU] via SUBCUTANEOUS
  Administered 2021-01-28: 2 [IU] via SUBCUTANEOUS
  Administered 2021-01-29: 3 [IU] via SUBCUTANEOUS
  Administered 2021-01-29: 2 [IU] via SUBCUTANEOUS

## 2021-01-27 MED ORDER — ICOSAPENT ETHYL 1 G PO CAPS
2.0000 g | ORAL_CAPSULE | Freq: Two times a day (BID) | ORAL | Status: DC
  Administered 2021-01-27 – 2021-01-30 (×5): 2 g via ORAL
  Filled 2021-01-27 (×7): qty 2

## 2021-01-27 MED ORDER — NITROGLYCERIN IN D5W 200-5 MCG/ML-% IV SOLN
0.0000 ug/min | INTRAVENOUS | Status: DC
Start: 2021-01-27 — End: 2021-01-29
  Administered 2021-01-27: 5 ug/min via INTRAVENOUS
  Filled 2021-01-27: qty 250

## 2021-01-27 MED ORDER — LISINOPRIL 40 MG PO TABS
40.0000 mg | ORAL_TABLET | Freq: Every day | ORAL | Status: DC
  Administered 2021-01-27 – 2021-01-30 (×4): 40 mg via ORAL
  Filled 2021-01-27 (×4): qty 1

## 2021-01-27 MED ORDER — NITROGLYCERIN 0.4 MG SL SUBL: 0.4000 mg | SUBLINGUAL_TABLET | SUBLINGUAL | Status: DC | PRN

## 2021-01-27 MED ORDER — SODIUM CHLORIDE 0.9 % IV SOLN: INTRAVENOUS | Status: DC

## 2021-01-27 MED ORDER — INSULIN ASPART 100 UNIT/ML IJ SOLN: 0.0000 [IU] | Freq: Every day | INTRAMUSCULAR | Status: DC

## 2021-01-27 MED ORDER — ASPIRIN 81 MG PO CHEW
324.0000 mg | CHEWABLE_TABLET | Freq: Once | ORAL | Status: AC
  Administered 2021-01-27: 324 mg via ORAL
  Filled 2021-01-27: qty 4

## 2021-01-27 MED ORDER — ASPIRIN EC 81 MG PO TBEC
81.0000 mg | DELAYED_RELEASE_TABLET | Freq: Every day | ORAL | Status: DC
  Administered 2021-01-28 – 2021-01-30 (×2): 81 mg via ORAL
  Filled 2021-01-27 (×2): qty 1

## 2021-01-27 MED ORDER — HEPARIN (PORCINE) 25000 UT/250ML-% IV SOLN
1850.0000 [IU]/h | INTRAVENOUS | Status: DC
  Administered 2021-01-27: 1300 [IU]/h via INTRAVENOUS
  Administered 2021-01-28: 1550 [IU]/h via INTRAVENOUS
  Administered 2021-01-28: 1750 [IU]/h via INTRAVENOUS
  Filled 2021-01-27 (×3): qty 250

## 2021-01-27 MED ORDER — VITAMIN D 25 MCG (1000 UNIT) PO TABS
1000.0000 [IU] | ORAL_TABLET | Freq: Every day | ORAL | Status: DC
  Administered 2021-01-28 – 2021-01-30 (×3): 1000 [IU] via ORAL
  Filled 2021-01-27 (×3): qty 1

## 2021-01-27 MED ORDER — PREGABALIN 75 MG PO CAPS
150.0000 mg | ORAL_CAPSULE | Freq: Two times a day (BID) | ORAL | Status: DC
  Administered 2021-01-27 – 2021-01-30 (×6): 150 mg via ORAL
  Filled 2021-01-27 (×6): qty 2

## 2021-01-27 MED ORDER — ACETAMINOPHEN 325 MG PO TABS: 650.0000 mg | ORAL_TABLET | ORAL | Status: DC | PRN

## 2021-01-27 MED ORDER — HEPARIN BOLUS VIA INFUSION
4000.0000 [IU] | Freq: Once | INTRAVENOUS | Status: AC
  Administered 2021-01-27: 4000 [IU] via INTRAVENOUS
  Filled 2021-01-27: qty 4000

## 2021-01-27 MED ORDER — SERTRALINE HCL 50 MG PO TABS
50.0000 mg | ORAL_TABLET | Freq: Two times a day (BID) | ORAL | Status: DC
  Administered 2021-01-27 – 2021-01-30 (×6): 50 mg via ORAL
  Filled 2021-01-27 (×6): qty 1

## 2021-01-27 MED ORDER — POLYETHYLENE GLYCOL 3350 17 G PO PACK: 17.0000 g | PACK | Freq: Every day | ORAL | Status: DC | PRN

## 2021-01-27 MED ORDER — EMPAGLIFLOZIN 25 MG PO TABS
25.0000 mg | ORAL_TABLET | Freq: Every day | ORAL | Status: DC
  Administered 2021-01-28 – 2021-01-30 (×2): 25 mg via ORAL
  Filled 2021-01-27 (×3): qty 1

## 2021-01-27 MED ORDER — DOCUSATE SODIUM 100 MG PO CAPS
100.0000 mg | ORAL_CAPSULE | Freq: Two times a day (BID) | ORAL | Status: DC
  Administered 2021-01-27 – 2021-01-30 (×6): 100 mg via ORAL
  Filled 2021-01-27 (×6): qty 1

## 2021-01-27 MED ORDER — INSULIN GLARGINE (2 UNIT DIAL) 300 UNIT/ML ~~LOC~~ SOPN: 15.0000 [IU] | PEN_INJECTOR | Freq: Every day | SUBCUTANEOUS | Status: DC

## 2021-01-27 MED ORDER — ZOLPIDEM TARTRATE 5 MG PO TABS
5.0000 mg | ORAL_TABLET | Freq: Every day | ORAL | Status: DC
  Administered 2021-01-27 – 2021-01-29 (×3): 5 mg via ORAL
  Filled 2021-01-27 (×3): qty 1

## 2021-01-27 MED ORDER — TRAMADOL HCL 50 MG PO TABS
50.0000 mg | ORAL_TABLET | Freq: Four times a day (QID) | ORAL | Status: DC | PRN
  Administered 2021-01-27 – 2021-01-30 (×8): 100 mg via ORAL
  Filled 2021-01-27 (×5): qty 2
  Filled 2021-01-27: qty 1
  Filled 2021-01-27 (×2): qty 2

## 2021-01-27 MED ORDER — INSULIN GLARGINE-YFGN 100 UNIT/ML ~~LOC~~ SOLN
15.0000 [IU] | Freq: Every day | SUBCUTANEOUS | Status: DC
  Administered 2021-01-27: 15 [IU] via SUBCUTANEOUS
  Filled 2021-01-27 (×2): qty 0.15

## 2021-01-27 MED ORDER — FAMOTIDINE 20 MG PO TABS
20.0000 mg | ORAL_TABLET | Freq: Two times a day (BID) | ORAL | Status: DC
  Administered 2021-01-27 – 2021-01-30 (×6): 20 mg via ORAL
  Filled 2021-01-27 (×6): qty 1

## 2021-01-27 MED ORDER — ASPIRIN EC 81 MG PO TBEC: 81.0000 mg | DELAYED_RELEASE_TABLET | Freq: Every day | ORAL | Status: DC

## 2021-01-27 MED ORDER — ASCORBIC ACID 500 MG PO TABS
1000.0000 mg | ORAL_TABLET | Freq: Every day | ORAL | Status: DC
  Administered 2021-01-28 – 2021-01-30 (×3): 1000 mg via ORAL
  Filled 2021-01-27 (×3): qty 2

## 2021-01-27 MED ORDER — ONDANSETRON HCL 4 MG/2ML IJ SOLN: 4.0000 mg | Freq: Four times a day (QID) | INTRAMUSCULAR | Status: DC | PRN

## 2021-01-27 MED ORDER — CARVEDILOL 6.25 MG PO TABS
6.2500 mg | ORAL_TABLET | Freq: Two times a day (BID) | ORAL | Status: DC
  Administered 2021-01-27 – 2021-01-30 (×6): 6.25 mg via ORAL
  Filled 2021-01-27 (×7): qty 1

## 2021-01-27 MED ORDER — ASPIRIN 81 MG PO CHEW: 324.0000 mg | CHEWABLE_TABLET | ORAL | Status: DC

## 2021-01-27 MED ORDER — PRASUGREL HCL 10 MG PO TABS
10.0000 mg | ORAL_TABLET | Freq: Every day | ORAL | Status: DC
  Administered 2021-01-28: 10 mg via ORAL
  Filled 2021-01-27: qty 1

## 2021-01-27 MED ORDER — METHOCARBAMOL 500 MG PO TABS
500.0000 mg | ORAL_TABLET | Freq: Three times a day (TID) | ORAL | Status: DC
  Administered 2021-01-27 – 2021-01-30 (×9): 500 mg via ORAL
  Filled 2021-01-27 (×9): qty 1

## 2021-01-27 NOTE — Plan of Care (Signed)
  Problem: Education: Goal: Knowledge of General Education information will improve Description: Including pain rating scale, medication(s)/side effects and non-pharmacologic comfort measures Outcome: Progressing   Problem: Clinical Measurements: Goal: Ability to maintain clinical measurements within normal limits will improve Outcome: Progressing Goal: Diagnostic test results will improve Outcome: Progressing Goal: Respiratory complications will improve Outcome: Progressing Goal: Cardiovascular complication will be avoided Outcome: Progressing   Problem: Activity: Goal: Risk for activity intolerance will decrease Outcome: Progressing   Problem: Pain Managment: Goal: General experience of comfort will improve Outcome: Progressing

## 2021-01-27 NOTE — Progress Notes (Signed)
ANTICOAGULATION CONSULT NOTE - Initial Consult  Pharmacy Consult for heparin Indication: chest pain/ACS  Allergies  Allergen Reactions   Victoza [Liraglutide] Other (See Comments)    pancreatitis   Brilinta [Ticagrelor]     Dyspnea   Codeine Other (See Comments)    hyperactive   Other Nausea And Vomiting    Mayonnaise causes nausea and vomiting   Statins     Muscle weakness & fatigue    Patient Measurements:   Heparin Dosing Weight: 108 kg   Vital Signs: Temp: 98.1 F (36.7 C) (12/10 1012) Temp Source: Oral (12/10 1012) BP: 201/79 (12/10 1330) Pulse Rate: 54 (12/10 1330)  Labs: Recent Labs    01/27/21 1035 01/27/21 1235  HGB 14.5  --   HCT 45.6  --   PLT 235  --   CREATININE 0.88  --   TROPONINIHS 80* 74*    CrCl cannot be calculated (Unknown ideal weight.).   Medical History: Past Medical History:  Diagnosis Date   Anxiety attack    Arthritis    "back; shoulders; knees" (07/23/2013)   Back pain, chronic    "all over" (07/23/2013)   Benign neoplasm of colon 04/01/2000   Hyperplastic   Coronary artery disease    a. NSTEMI 10/12: s/p CABG with L-LAD, S-OM1/OM2, S-RV marg/RCA;  b. 07/2013 Cath/PCI: LM <10, LAD 70-80p, 40m, D1 90, RI 30ost, LCX 40-50p, 63m, OM1 90, OM2 100, RCA 100p, VG->OM1->OM2 patent with 90 @ anast (2.5x12 Xience Alpine DES), RCA 100p, LIMA->LAD nl, VG->Diag nl, VG->RV marginal/RCA 90p (3.0x23 Xience Alpine DES);  c. 07/2013 Echo: EF 55-60%.   Depression    "pain related"   DM2 (diabetes mellitus, type 2) (HCC)    GERD (gastroesophageal reflux disease)    Hyperlipidemia    Hypertension    IBS (irritable bowel syndrome)    Morbidly obese (HCC)     Medications:  (Not in a hospital admission)   Assessment: 41 YOM with h/o CABG who presents with chest pain. Pharmacy consulted to start IV heparin for ACS.   H/H and Plt wnl. SCr wnl   Goal of Therapy:  Heparin level 0.3-0.7 units/ml Monitor platelets by anticoagulation protocol: Yes    Plan:  -Heparin 4000 units  IV bolus followed by heparin infusion at 1300 units/hr  -F/u 6 hr HL -MOnitor daily HL, CBC and s/s of bleeding    Albertina Parr, PharmD., BCPS, BCCCP Clinical Pharmacist Please refer to [facility] for unit-specific pharmacist

## 2021-01-27 NOTE — H&P (Addendum)
Cardiology Admission History and Physical:   Patient ID: Spencer [name] MRN: [id number]; DOB: 02-Oct-1955   Admission date: 01/27/2021  PCP:  Spencer Bunting, MD   [facility] Providers Cardiologist:  Spencer Sacramento, MD        Chief Complaint:  chest pain  Patient Profile:   Spencer [name] is a 64 y.o. male with CAD with CABG 2012 ICM EF 45%, and DES to VG to OM and VG to RCA in 2015, DM-2, carotid disease, obesity, HLD, intolerant to statins, who is being seen 01/27/2021 for the evaluation of chest pain.  History of Present Illness:   Spencer [name] with above hx and CABG 2012 LIMA to LAD, sequential SVG to OM-1 and OM-2, seq VG to RV marginal and RCA, SVG to 1st diag.found on presentation for NSTEMI.    In 2015 with cath patent free IMA to LAD, patent seq VG to OM1/OM2 with high grade anastomotic stenosis at insertion into OM2, patent VG to diag, patent VG to RV marginal/RCA with severe stenosis in proximal SVG.  PCI to VG to RCA with DES and to VG to OM2 with DES.    Last echo 07/2013 with EF 55-60%, mild LVH, valves normal.   He has PAD with unreliable ABIs due to calcifications.  PV angio 07/2017 with no significant aorto iliac disease.  Mild to moderate SFA and popliteal disease. .One-vessel runoff below the knee bilaterally via the peroneal artery which gave collaterals distally to the dorsalis pedis and plantar branches of the posterior tibial artery.  No revascularization was needed.  also stable carotid disease. Last dopplers in 03/2020.  Pt has been having angina in chest and in both wrists (this is his angina) since Oct. Off and on , increases with work at [employer] and with activity but no worse though he does note the wrist pain wakens him form sleep.  No SOB, no diaphoresis, no N&V, no palpitations.  Today  he told his wife about the pain and she brought to ER.  Currently with mild discomfort at rest, BP is higher as well.    He took his insulin this AM so will order diet.   Hs  troponin 80 2 V CXR no acute disease  EKG:  The ECG that was done SR with RBBB and no acute changes was personally reviewed  BP 152/66 P 58 R 12 afebrile.  COVID neg Na 136, K+ 4.9, BUN 24, Cr 0.88  Hs Troponin 80  Hgb 14.5 WBC 6.3 plts 235 2 V CXR NAD   Past Medical History:  Diagnosis Date   Anxiety attack    Arthritis    "back; shoulders; knees" (07/23/2013)   Back pain, chronic    "all over" (07/23/2013)   Benign neoplasm of colon 04/01/2000   Hyperplastic   Coronary artery disease    a. NSTEMI 10/12: s/p CABG with L-LAD, S-OM1/OM2, S-RV marg/RCA;  b. 07/2013 Cath/PCI: LM <10, LAD 70-80p, 53m, D1 90, RI 30ost, LCX 40-50p, 10m, OM1 90, OM2 100, RCA 100p, VG->OM1->OM2 patent with 90 @ anast (2.5x12 Xience Alpine DES), RCA 100p, LIMA->LAD nl, VG->Diag nl, VG->RV marginal/RCA 90p (3.0x23 Xience Alpine DES);  c. 07/2013 Echo: EF 55-60%.   Depression    "pain related"   DM2 (diabetes mellitus, type 2) (HCC)    GERD (gastroesophageal reflux disease)    Hyperlipidemia    Hypertension    IBS (irritable bowel syndrome)    Morbidly obese (HCC)     Past Surgical History:  Procedure Laterality Date   ABDOMINAL AORTOGRAM W/LOWER EXTREMITY N/A 07/30/2017   Procedure: ABDOMINAL AORTOGRAM W/LOWER EXTREMITY;  Surgeon: Spencer Hampshire, MD;  Location: [facility];  Service: Cardiovascular;  Laterality: N/A;   BACK SURGERY     CARDIAC CATHETERIZATION  11/2010   CARPAL TUNNEL RELEASE Bilateral    CORONARY ANGIOPLASTY WITH STENT PLACEMENT  07/23/2013   "2"   CORONARY ARTERY BYPASS GRAFT  11/26/10   x 6 SURGEON Spencer [name]   HERNIA REPAIR     UHR   LEFT HEART CATHETERIZATION WITH CORONARY/GRAFT ANGIOGRAM N/A 07/23/2013   Procedure: LEFT HEART CATHETERIZATION WITH Spencer [name];  Surgeon: Spencer M Martinique, MD;  Location: [facility];  Service: Cardiovascular;  Laterality: N/A;   LUMBAR FUSION     MULTIPLE; DR. Ellene [name]   SHOULDER ARTHROSCOPY W/ ROTATOR CUFF REPAIR Bilateral     UMBILICAL HERNIA REPAIR       Medications Prior to Admission: Prior to Admission medications   Medication Sig Start Date End Date Taking? Authorizing Provider  Ascorbic Acid (VITAMIN C) 1000 MG tablet Take 1,000 mg by mouth daily.    [provider]  aspirin EC 81 MG tablet Take 81 mg by mouth daily.    [provider]  azithromycin (ZITHROMAX) 250 MG tablet Take 2 tablets by mouth on the first day and then one a day until gone 11/22/20     carvedilol (COREG) 6.25 MG tablet TAKE 1 TABLET BY MOUTH TWICE DAILY 07/26/20 07/26/21  Spencer Hampshire, MD  cholecalciferol (VITAMIN D) 1000 UNITS tablet Take 1,000 Units by mouth daily.    [provider]  ciprofloxacin (CIPRO) 500 MG tablet Take 1 tablet (500 mg total) by mouth every 12 (twelve) hours. 11/22/20     COVID-19 mRNA vaccine, Moderna, 100 MCG/0.5ML injection INJECT AS DIRECTED 03/28/20 03/28/21  Spencer Basques, MD  COVID-19 mRNA vaccine, Moderna, 100 MCG/0.5ML injection Inject into the muscle. 09/26/20   Spencer Basques, MD  docusate sodium (COLACE) 100 MG capsule Take 100 mg by mouth 2 (two) times daily.    [provider]  empagliflozin (JARDIANCE) 25 MG TABS tablet Take 1 tablet by mouth once a day 09/01/20     Evolocumab (REPATHA SURECLICK) 416 MG/ML SOAJ Inject 140mg  every 2 weeks Subcutaneous 30 days 09/01/20     Evolocumab 140 MG/ML SOAJ INJECT 140 MG INTO THE SKIN EVERY 14 DAYS. 01/27/20 02/21/21  Spencer Hampshire, MD  famotidine (PEPCID) 20 MG tablet TAKE 2 TABLETS BY MOUTH TWO TIMES DAILY 09/06/20     FREESTYLE LITE test strip  05/05/19   [provider]  HUMALOG KWIKPEN 100 UNIT/ML KwikPen Inject 4 Units into the skin See admin instructions. With meals as directed 06/15/19   [provider]  icosapent Ethyl (VASCEPA) 1 g capsule Take 2 capsules by mouth with meals twice a day 09/01/20     influenza vac split quadrivalent PF (FLUARIX) 0.5 ML injection Inject into the muscle. 11/08/20   Spencer Basques, MD  Insulin Glargine [facility]) 100 UNIT/ML Inject 32 Units into the skin daily.    [provider]  insulin glargine, 2 Unit Dial, (TOUJEO MAX SOLOSTAR) 300 UNIT/ML Solostar Pen Inject 32 units into the skin daily and increase as directed in clinic up to 100 units. 09/01/20     insulin lispro (HUMALOG KWIKPEN) 100 UNIT/ML KwikPen Inject 4 units Subcutaneous with meals 09/01/20     Insulin Pen Needle 32G X 6 MM MISC  Use with Victoza daily. 06/01/20   Spencer Bunting, MD  JARDIANCE 25 MG TABS tablet Take 25 mg by mouth daily.  07/08/17   [provider]  lisinopril (PRINIVIL,ZESTRIL) 40 MG tablet Take 1 tablet (40 mg total) by mouth daily. 07/24/13   Theora Gianotti, NP  lisinopril (ZESTRIL) 40 MG tablet 1 tablet Orally Once a day 90 days 09/01/20     metFORMIN (GLUCOPHAGE-XR) 500 MG 24 hr tablet Take 1 tablet by mouth twice a day 09/01/20     methocarbamol (ROBAXIN) 500 MG tablet Take 500 mg by mouth every 8 (eight) hours. scheduled    [provider]  methocarbamol (ROBAXIN) 500 MG tablet TAKE 1 TABLET BY MOUTH 3 TIMES DAILY AS NEEDED 11/22/20     metroNIDAZOLE (METROGEL) 1 % gel APPLY TO AFFECTED AREAS EXTERNALLY DAILY 05/09/20 05/09/21  Tivis Ringer, RPH-CPP  nitroGLYCERIN (NITROSTAT) 0.4 MG SL tablet Place 1 tablet (0.4 mg total) under the tongue every 5 (five) minutes as needed for chest pain (CP or SOB). 07/24/13   Theora Gianotti, NP  oxycodone (OXY-IR) 5 MG capsule Take 1 capsule (5 mg total) by mouth every 4 (four) hours as needed. 11/20/19   Debbe Odea, MD  PENTIPS 31G X 5 MM MISC  05/05/19   [provider]  polyethylene glycol powder (GLYCOLAX/MIRALAX) 17 GM/SCOOP powder Take 17 g by mouth daily as needed (constipation).    [provider]  prasugrel (EFFIENT) 10 MG TABS tablet Take 1 tablet (10 mg total) by mouth daily. 09/01/20     pregabalin (LYRICA) 150 MG capsule Take 150 mg by mouth 2 (two) times daily. 0530 &  1730    [provider]  pregabalin (LYRICA) 150 MG capsule Take 1 capsule by mouth twice a day 11/29/20     pregabalin (LYRICA) 150 MG capsule Take 1 capsule by mouth twice a day 11/29/20     pregabalin (LYRICA) 150 MG capsule Take 1 capsule (150 mg total) by mouth 2 (two) times daily. 12/13/20     Probiotic Product (PROBIOTIC PO) Take 1 each by mouth daily. Keifer liquid yogurt probiotic    [provider]  pyridOXINE (VITAMIN B-6) 100 MG tablet Take 100 mg by mouth daily.    [provider]  senna-docusate (SENOKOT-S) 8.6-50 MG tablet Take 1 tablet by mouth at bedtime as needed for mild constipation. 11/20/19   Debbe Odea, MD  sertraline (ZOLOFT) 50 MG tablet TAKE 1 TABLET BY MOUTH TWICE DAILY 09/06/20     traMADol (ULTRAM) 50 MG tablet Take 100 mg by mouth in the morning, at noon, and at bedtime. scheduled    [provider]  traMADol (ULTRAM) 50 MG tablet Take 1-2 tablets (50-100 mg total) by mouth every 6 (six) hours as needed for pain. 11/22/20     zolpidem (AMBIEN) 10 MG tablet Take 5 mg by mouth at bedtime.    [provider]  zolpidem (AMBIEN) 10 MG tablet Take 1 tablet by mouth at bedtime as needed for insomnia 10/27/20     zolpidem (AMBIEN) 10 MG tablet Take 1 tablet (10 mg total) by mouth at bedtime as needed for sleep. 11/22/20        Allergies:    Allergies  Allergen Reactions   Victoza [Liraglutide] Other (See Comments)    pancreatitis   Brilinta [Ticagrelor]     Dyspnea   Codeine Other (See Comments)    hyperactive   Other Nausea And Vomiting    Mayonnaise causes  nausea and vomiting   Statins     Muscle weakness & fatigue    Social History:   Social History   Socioeconomic History   Marital status: Married    Spouse name: Not on file   Number of children: Not on file   Years of education: Not on file   Highest education level: Not on file  Occupational History   Occupation: RETIRED   Occupation: disabled    Employer:  DISABLED  Tobacco Use   Smoking status: Never   Smokeless tobacco: Never  Vaping Use   Vaping Use: Never used  Substance and Sexual Activity   Alcohol use: No   Drug use: No   Sexual activity: Not Currently  Other Topics Concern   Not on file  Social History Narrative   WATER AEROBICS 4-5 DAYS A WEEK   Social Determinants of Health   Financial Resource Strain: Not on file  Food Insecurity: Not on file  Transportation Needs: Not on file  Physical Activity: Not on file  Stress: Not on file  Social Connections: Not on file  Intimate Partner Violence: Not on file    Family History:   The patient's family history includes Heart attack in his brother; Heart disease in his brother and father. There is no history of Peripheral vascular disease, Diabetes, Colon cancer, Pancreatic cancer, Prostate cancer, or Rectal cancer.    ROS:  Please see the history of present illness.  General:no colds or fevers, no weight changes Skin:no rashes or ulcers HEENT:no blurred vision, no congestion CV:see HPI PUL:see HPI GI:no diarrhea constipation or melena, no indigestion GU:no hematuria, no dysuria MS:no joint pain, no claudication, + chronic back pain on ultram Neuro:no syncope, no lightheadedness Endo:+ diabetes, no thyroid disease  All other ROS reviewed and negative.     Physical Exam/Data:   Vitals:   01/27/21 1200 01/27/21 1215 01/27/21 1250 01/27/21 1300  BP: (!) 142/65 (!) 152/66 134/90 (!) 150/104  Pulse: (!) 58 (!) 52 (!) 52 (!) 52  Resp: 12 12 15 15   Temp:      TempSrc:      SpO2: 99% 99% 100% 100%   No intake or output data in the 24 hours ending 01/27/21 1322 Last 3 Weights 10/17/2020 04/04/2020 01/31/2020  Weight (lbs) 280 lb 3.2 oz 277 lb 9.6 oz 289 lb  Weight (kg) 127.098 kg 125.919 kg 131.09 kg     There is no height or weight on file to calculate BMI.  General:  Well nourished, well developed, in no acute distress but is having discomfort HEENT: normal Neck: no  JVD Vascular: + carotid bruits; Distal pulses 2+ bilaterally   Cardiac:  normal S1, S2; RRR; no murmur gallup rub or click Lungs:  clear to auscultation bilaterally, no wheezing, rhonchi or rales  Abd: soft, nontender, no hepatomegaly  Ext: no edema Musculoskeletal:  No deformities, BUE and BLE strength normal and equal Skin: warm and dry  Neuro:  alert and oriented X 3 MAE follows commands, no focal abnormalities noted Psych:  Normal affect    Relevant CV Studies:  Cardiac cath 2015  Final Conclusions:   1. Severe 3 vessel obstructive CAD 2. Patent free IMA graft to the LAD 3. Patent sequential graft to OM1/OM2 with high grade anastomotic stenosis at insertion into OM2 4. Patent SVG to the diagonal 5. Patent SVG to RV marginal/ RCA with severe stenosis in proximal SVG 6. Elevated EDP 7. Successful stenting of SVG to RCA with DES  8. Successful stenting of SVG to OM2 with DES   Recommendations:  Continue dual antiplatelet therapy for one year. Will get Echocardiogram to assess LV function. Anticipate DC in am.   ECHO 2015 Study Conclusions   - Procedure narrative: Transthoracic echocardiography. Image    quality was suboptimal. The study was technically difficult, as a    result of body habitus. Intravenous contrast (Definity) was    administered.  - Left ventricle: The cavity size was normal. Wall thickness was    increased in a pattern of mild LVH. Systolic function was normal.    The estimated ejection fraction was in the range of 55% to 60%.    Definity contrast was given to enhance wall definition, however,    wall motion abnormalities cannot be excluded. No apical thrombus    was identified. The study is not technically sufficient to allow    evaluation of LV diastolic function.  - Aortic valve: Trileaflet. No stenosis or regurgitation.  - Left atrium: The atrium was normal in size.   Impressions:   - Technically difficult study due to body habitus. LVEF 55-60%,     mild LVH, wall motion abnormalities cannot be excluded, diastolic    function could not be adequately assessed.   Laboratory Data:  High Sensitivity Troponin:   Recent Labs  Lab 01/27/21 1035  TROPONINIHS 80*      Chemistry Recent Labs  Lab 01/27/21 1035  NA 136  K 4.9  CL 103  CO2 23  GLUCOSE 169*  BUN 24*  CREATININE 0.88  CALCIUM 9.0  GFRNONAA >60  ANIONGAP 10    No results for input(s): PROT, ALBUMIN, AST, ALT, ALKPHOS, BILITOT in the last 168 hours. Lipids No results for input(s): CHOL, TRIG, HDL, LABVLDL, LDLCALC, CHOLHDL in the last 168 hours. Hematology Recent Labs  Lab 01/27/21 1035  WBC 6.3  RBC 5.01  HGB 14.5  HCT 45.6  MCV 91.0  MCH 28.9  MCHC 31.8  RDW 12.9  PLT 235   Thyroid No results for input(s): TSH, FREET4 in the last 168 hours. BNPNo results for input(s): BNP, PROBNP in the last 168 hours.  DDimer No results for input(s): DDIMER in the last 168 hours.   Radiology/Studies:  DG Chest 2 View  Result Date: 01/27/2021 CLINICAL DATA:  Chest pain EXAM: CHEST - 2 VIEW COMPARISON:  Chest x-ray 12/23/2017 FINDINGS: Heart size and mediastinal contours are within normal limits. Cardiac surgical changes and median sternotomy wires. No suspicious pulmonary opacities identified. No pleural effusion or pneumothorax visualized. No acute osseous abnormality appreciated. IMPRESSION: No acute intrathoracic process identified. Electronically Signed   By: Ofilia Neas M.D.   On: 01/27/2021 11:11     Assessment and Plan:   Unstable angina with elevated troponin, his grafts are 63 years old and last stents to VG to RCA and VG to OM2 2015.  Plan for repeat troponin and add IV heparin and  IV NTG for pain control and decrease BP.  Plan cardiac cath for Monday unless symptoms increase would do earlier.     Will check echo, continue BB ACE and is on repatha for chol.  Continue effient.  CAD with hx CABG 2012 and PCI to 2 VG in 2015. See above PAD stable on  Repatha DM-2 will decrease daily insulin and hold metformin, continue jardiance for CAD as well. SSI and CBGs AC and HS HLD on repatha does not tolerate statins.  Will check lipids. Chronic back pain on muscle relaxer  and ultram will continue.     Risk Assessment/Risk Scores:    TIMI Risk Score for Unstable Angina or Non-ST Elevation MI:   The patient's TIMI risk score is 5, which indicates a 26% risk of all cause mortality, new or recurrent myocardial infarction or need for urgent revascularization in the next 14 days.       Severity of Illness: The appropriate patient status for this patient is INPATIENT. Inpatient status is judged to be reasonable and necessary in order to provide the required intensity of service to ensure the patient's safety. The patient's presenting symptoms, physical exam findings, and initial radiographic and laboratory data in the context of their chronic comorbidities is felt to place them at high risk for further clinical deterioration. Furthermore, it is not anticipated that the patient will be medically stable for discharge from the hospital within 2 midnights of admission.   * I certify that at the point of admission it is my clinical judgment that the patient will require inpatient hospital care spanning beyond 2 midnights from the point of admission due to high intensity of service, high risk for further deterioration and high frequency of surveillance required.*   For questions or updates, please contact [place] Please consult [email or web address] for contact info under     Signed, Cecilie Kicks, NP  01/27/2021 1:22 PM    History and all data above reviewed.  Patient examined.  I agree with the findings as above.  Patient had been having some bilateral wrist discomfort which is similar to his previous angina.  This is actually been going on since October but he had it last night at rest.  He has noticed it getting slightly worse.  He also has had some  chest discomfort he thinks reminiscent of his previous angina.  He finally told his wife about it and was brought here today.  He has not really had the symptoms since his angioplasty in 2015.  He has diabetes that is not completely controlled.  It sounds like there is some dietary indiscretion.  He had not been taking his insulin necessarily as prescribed.  He has not been having any new shortness of breath, PND or orthopnea.  He does do water aerobics and has noticed some chest discomfort with this.  In the emergency room there is no objective evidence of ischemia.  The patient exam reveals COR: Regular rate and rhythm, no murmurs,  Lungs: Clear to auscultation bilaterally,  Abd: Positive bowel sounds normal in frequency and pitch, bruits, rebound, guarding, Ext 2+ upper pulses, mildly decreased dorsalis pedis and posterior tibialis bilaterally, left greater than right carotid bruit.  All available labs, radiology testing, previous records reviewed. Agree with documented assessment and plan.  Unstable angina: Patient's chest discomfort is consistent with increasing exertional and now resting chest discomfort similar to previous and likely to represent obstructive coronary disease.   Cardiac catheterization is indicated.  The patient understands that risks included but are not limited to stroke (1 in 1000), death (1 in 82), kidney failure [usually temporary] (1 in 500), bleeding (1 in 200), allergic reaction [possibly serious] (1 in 200).  The patient understands and agrees to proceed.   Of note the patient has previously had successful approach.  The left radial   Minus Breeding  1:36 PM  01/27/2021

## 2021-01-27 NOTE — ED Provider Notes (Signed)
Emergency Medicine Provider Triage Evaluation Note  Spencer [name] , a 65 y.o. male  was evaluated in triage.  Pt complains of central intermittent, nonradiating chest pain since October.  He states that his chest pain feels "funny."  He has difficulty elaborating further on the quality of the pain.  He denies shortness of breath, syncope, nausea, diaphoresis with the pain.  He has extensive cardiac history with x6 bypass and stent placement.  He states that he also gets pain in his left wrist, however does not radiate from his chest to his wrist.  He does note increased left wrist pain when he is exercising.  Review of Systems  Positive: See above Negative:   Physical Exam  BP (!) 151/73 (BP Location: Right Arm)   Pulse (!) 59   Temp 98.1 F (36.7 C) (Oral)   Resp 18   SpO2 99%  Gen:   Awake, no distress   Resp:  Normal effort, lungs clear to auscultation bilaterally MSK:   Moves extremities without difficulty  Other:  S1/S2 without murmur.  He does have pulse discrepancy on the right side.  Obtained bilateral blood pressures which are similar.  Medical Decision Making  Medically screening exam initiated at 10:36 AM.  Appropriate orders placed.  GIORDAN FORDHAM was informed that the remainder of the evaluation will be completed by another provider, this initial triage assessment does not replace that evaluation, and the importance of remaining in the ED until their evaluation is complete.     Mickie Hillier, PA-C 01/27/21 [place], DO 01/27/21 1055

## 2021-01-27 NOTE — ED Provider Notes (Signed)
[name] EMERGENCY DEPARTMENT Provider Note   CSN: [id number] Arrival date & time: 01/27/21  8502     History Chief Complaint  Patient presents with   Chest Pain    Spencer [name] is a 65 y.o. male.  Patient with history of CABG x6, cardiac stent, compliant with antiplatelet medications --presents the emergency department for evaluation of chest pain and wrist pain.  Patient states that with previous MI he had pain in his wrists.  Starting about 2 months ago, he developed wrist pain and some vague chest pain with exercising.  [facility] in the deep end of the pool.  He states that when he exercises he develops a pressure in his mid chest and pain in his wrists.  This improves when he decreases exercise.  He has neglected telling his family about this until today due to a recent trip and family visiting over the Thanksgiving holiday.  No associated lightheadedness or syncope.  No vomiting.  No lower extremity swelling.  Discomfort is currently mild but rates at 6 out of 10.  He denies drug use. The onset of this condition was acute. The course is constant. Aggravating factors: Exertion. Alleviating factors: none.        Past Medical History:  Diagnosis Date   Anxiety attack    Arthritis    "back; shoulders; knees" (07/23/2013)   Back pain, chronic    "all over" (07/23/2013)   Benign neoplasm of colon 04/01/2000   Hyperplastic   Coronary artery disease    a. NSTEMI 10/12: s/p CABG with L-LAD, S-OM1/OM2, S-RV marg/RCA;  b. 07/2013 Cath/PCI: LM <10, LAD 70-80p, 58m, D1 90, RI 30ost, LCX 40-50p, 67m, OM1 90, OM2 100, RCA 100p, VG->OM1->OM2 patent with 90 @ anast (2.5x12 Xience Alpine DES), RCA 100p, LIMA->LAD nl, VG->Diag nl, VG->RV marginal/RCA 90p (3.0x23 Xience Alpine DES);  c. 07/2013 Echo: EF 55-60%.   Depression    "pain related"   DM2 (diabetes mellitus, type 2) (HCC)    GERD (gastroesophageal reflux disease)     Hyperlipidemia    Hypertension    IBS (irritable bowel syndrome)    Knee pain, bilateral    a. since 01/2013.   Morbidly obese ([place])    Myocardial infarction ([place]) 2012   Obesity    Pleural effusion, left 11/2010   Postop; resolved with p.o. diuresis    Patient Active Problem List   Diagnosis Date Noted   Fall 11/19/2019   Multiple fractures of ribs, right side, initial encounter for closed fracture 11/19/2019   Hyperlipidemia associated with type 2 diabetes mellitus ([place])    Back pain, chronic    Unstable angina ([place]) 07/23/2013   Angina at rest [facility]) 07/22/2013   Crescendo angina ([place]) 07/22/2013   Right carotid bruit 05/26/2012   Morbid obesity ([place]) 04/28/2012   Hx of colonic polyps 04/28/2012   Shortness of breath 12/11/2010   Coronary artery disease    Hypertension associated with diabetes ([place])    Hyperlipidemia    DM2 (diabetes mellitus, type 2) ([place])    CONTACT DERMATITIS 04/17/2008    Past Surgical History:  Procedure Laterality Date   ABDOMINAL AORTOGRAM W/LOWER EXTREMITY N/A 07/30/2017   Procedure: ABDOMINAL AORTOGRAM W/LOWER EXTREMITY;  Surgeon: Wellington Hampshire, MD;  Location: [facility];  Service: Cardiovascular;  Laterality: N/A;   BACK SURGERY     CARDIAC CATHETERIZATION  11/2010   CARPAL TUNNEL RELEASE Bilateral  CORONARY ANGIOPLASTY WITH STENT PLACEMENT  07/23/2013   "2"   CORONARY ARTERY BYPASS GRAFT  11/26/10   x 6 SURGEON DR. PETER VAN TRIGT   HERNIA REPAIR     UHR   LEFT HEART CATHETERIZATION WITH CORONARY/GRAFT ANGIOGRAM N/A 07/23/2013   Procedure: LEFT HEART CATHETERIZATION WITH Beatrix Fetters;  Surgeon: Peter M Martinique, MD;  Location: [facility] CATH LAB;  Service: Cardiovascular;  Laterality: N/A;   LUMBAR FUSION     MULTIPLE; DR. Ellene Route   SHOULDER ARTHROSCOPY W/ ROTATOR CUFF REPAIR Bilateral    UMBILICAL HERNIA REPAIR         Family History  Problem Relation Age of Onset   Hypertension Neg Hx    Heart disease Neg Hx     Peripheral vascular disease Neg Hx    Diabetes Neg Hx    Colon cancer Neg Hx    Pancreatic cancer Neg Hx    Prostate cancer Neg Hx    Rectal cancer Neg Hx     Social History   Tobacco Use   Smoking status: Never   Smokeless tobacco: Never  Vaping Use   Vaping Use: Never used  Substance Use Topics   Alcohol use: No   Drug use: No    Home Medications Prior to Admission medications   Medication Sig Start Date End Date Taking? Authorizing Provider  Ascorbic Acid (VITAMIN C) 1000 MG tablet Take 1,000 mg by mouth daily.    [provider]  aspirin EC 81 MG tablet Take 81 mg by mouth daily.    [provider]  azithromycin (ZITHROMAX) 250 MG tablet Take 2 tablets by mouth on the first day and then one a day until gone 11/22/20     carvedilol (COREG) 6.25 MG tablet TAKE 1 TABLET BY MOUTH TWICE DAILY 07/26/20 07/26/21  Wellington Hampshire, MD  cholecalciferol (VITAMIN D) 1000 UNITS tablet Take 1,000 Units by mouth daily.    [provider]  ciprofloxacin (CIPRO) 500 MG tablet Take 1 tablet (500 mg total) by mouth every 12 (twelve) hours. 11/22/20     COVID-19 mRNA vaccine, Moderna, 100 MCG/0.5ML injection INJECT AS DIRECTED 03/28/20 03/28/21  Carlyle Basques, MD  COVID-19 mRNA vaccine, Moderna, 100 MCG/0.5ML injection Inject into the muscle. 09/26/20   Carlyle Basques, MD  docusate sodium (COLACE) 100 MG capsule Take 100 mg by mouth 2 (two) times daily.    [provider]  empagliflozin (JARDIANCE) 25 MG TABS tablet Take 1 tablet by mouth once a day 09/01/20     Evolocumab (REPATHA SURECLICK) 115 MG/ML SOAJ Inject 140mg  every 2 weeks Subcutaneous 30 days 09/01/20     Evolocumab 140 MG/ML SOAJ INJECT 140 MG INTO THE SKIN EVERY 14 DAYS. 01/27/20 02/21/21  Wellington Hampshire, MD  famotidine (PEPCID) 20 MG tablet TAKE 2 TABLETS BY MOUTH TWO TIMES DAILY 09/06/20     FREESTYLE LITE test strip  05/05/19   [provider]  HUMALOG KWIKPEN 100 UNIT/ML KwikPen Inject 4 Units  into the skin See admin instructions. With meals as directed 06/15/19   [provider]  icosapent Ethyl (VASCEPA) 1 g capsule Take 2 capsules by mouth with meals twice a day 09/01/20     influenza vac split quadrivalent PF (FLUARIX) 0.5 ML injection Inject into the muscle. 11/08/20   Carlyle Basques, MD  Insulin Glargine [facility]) 100 UNIT/ML Inject 32 Units into the skin daily.    [provider]  insulin glargine, 2 Unit Dial, (TOUJEO MAX SOLOSTAR) 300  UNIT/ML Solostar Pen Inject 32 units into the skin daily and increase as directed in clinic up to 100 units. 09/01/20     insulin lispro (HUMALOG KWIKPEN) 100 UNIT/ML KwikPen Inject 4 units Subcutaneous with meals 09/01/20     Insulin Pen Needle 32G X 6 MM MISC Use with Victoza daily. 06/01/20   Burnard Bunting, MD  JARDIANCE 25 MG TABS tablet Take 25 mg by mouth daily.  07/08/17   [provider]  lisinopril (PRINIVIL,ZESTRIL) 40 MG tablet Take 1 tablet (40 mg total) by mouth daily. 07/24/13   Theora Gianotti, NP  lisinopril (ZESTRIL) 40 MG tablet 1 tablet Orally Once a day 90 days 09/01/20     metFORMIN (GLUCOPHAGE-XR) 500 MG 24 hr tablet Take 1 tablet by mouth twice a day 09/01/20     methocarbamol (ROBAXIN) 500 MG tablet Take 500 mg by mouth every 8 (eight) hours. scheduled    [provider]  methocarbamol (ROBAXIN) 500 MG tablet TAKE 1 TABLET BY MOUTH 3 TIMES DAILY AS NEEDED 11/22/20     metroNIDAZOLE (METROGEL) 1 % gel APPLY TO AFFECTED AREAS EXTERNALLY DAILY 05/09/20 05/09/21  Tivis Ringer, RPH-CPP  nitroGLYCERIN (NITROSTAT) 0.4 MG SL tablet Place 1 tablet (0.4 mg total) under the tongue every 5 (five) minutes as needed for chest pain (CP or SOB). 07/24/13   Theora Gianotti, NP  oxycodone (OXY-IR) 5 MG capsule Take 1 capsule (5 mg total) by mouth every 4 (four) hours as needed. 11/20/19   Debbe Odea, MD  PENTIPS 31G X 5 MM MISC  05/05/19   [provider]  polyethylene glycol  powder (GLYCOLAX/MIRALAX) 17 GM/SCOOP powder Take 17 g by mouth daily as needed (constipation).    [provider]  prasugrel (EFFIENT) 10 MG TABS tablet Take 1 tablet (10 mg total) by mouth daily. 09/01/20     pregabalin (LYRICA) 150 MG capsule Take 150 mg by mouth 2 (two) times daily. 0530 & 1730    [provider]  pregabalin (LYRICA) 150 MG capsule Take 1 capsule by mouth twice a day 11/29/20     pregabalin (LYRICA) 150 MG capsule Take 1 capsule by mouth twice a day 11/29/20     pregabalin (LYRICA) 150 MG capsule Take 1 capsule (150 mg total) by mouth 2 (two) times daily. 12/13/20     Probiotic Product (PROBIOTIC PO) Take 1 each by mouth daily. Keifer liquid yogurt probiotic    [provider]  pyridOXINE (VITAMIN B-6) 100 MG tablet Take 100 mg by mouth daily.    [provider]  senna-docusate (SENOKOT-S) 8.6-50 MG tablet Take 1 tablet by mouth at bedtime as needed for mild constipation. 11/20/19   Debbe Odea, MD  sertraline (ZOLOFT) 50 MG tablet TAKE 1 TABLET BY MOUTH TWICE DAILY 09/06/20     traMADol (ULTRAM) 50 MG tablet Take 100 mg by mouth in the morning, at noon, and at bedtime. scheduled    [provider]  traMADol (ULTRAM) 50 MG tablet Take 1-2 tablets (50-100 mg total) by mouth every 6 (six) hours as needed for pain. 11/22/20     zolpidem (AMBIEN) 10 MG tablet Take 5 mg by mouth at bedtime.    [provider]  zolpidem (AMBIEN) 10 MG tablet Take 1 tablet by mouth at bedtime as needed for insomnia 10/27/20     zolpidem (AMBIEN) 10 MG tablet Take 1 tablet (10 mg total) by mouth at bedtime as needed for sleep. 11/22/20  Allergies    Victoza [liraglutide], Brilinta [ticagrelor], Codeine, Other, and Statins  Review of Systems   Review of Systems  Constitutional:  Negative for fever.  HENT:  Negative for rhinorrhea and sore throat.   Eyes:  Negative for redness.  Respiratory:  Negative for cough and shortness of breath.    Cardiovascular:  Positive for chest pain.  Gastrointestinal:  Negative for abdominal pain, diarrhea, nausea and vomiting.  Genitourinary:  Negative for dysuria and hematuria.  Musculoskeletal:  Positive for myalgias.  Skin:  Negative for rash.  Neurological:  Negative for headaches.   Physical Exam Updated Vital Signs BP (!) 151/73 (BP Location: Right Arm)   Pulse (!) 59   Temp 98.1 F (36.7 C) (Oral)   Resp 18   SpO2 99%   Physical Exam Vitals and nursing note reviewed.  Constitutional:      Appearance: He is well-developed. He is not diaphoretic.  HENT:     Head: Normocephalic and atraumatic.     Mouth/Throat:     Mouth: Mucous membranes are not dry.  Eyes:     Conjunctiva/sclera: Conjunctivae normal.  Neck:     Vascular: Normal carotid pulses. No carotid bruit or JVD.     Trachea: Trachea normal. No tracheal deviation.  Cardiovascular:     Rate and Rhythm: Normal rate and regular rhythm.     Pulses: No decreased pulses.          Radial pulses are 2+ on the right side and 2+ on the left side.     Heart sounds: Normal heart sounds, S1 normal and S2 normal. Heart sounds not distant. No murmur heard. Pulmonary:     Effort: Pulmonary effort is normal. No respiratory distress.     Breath sounds: Normal breath sounds. No wheezing.  Chest:     Chest wall: No tenderness.  Abdominal:     General: Bowel sounds are normal.     Palpations: Abdomen is soft.     Tenderness: There is no abdominal tenderness. There is no guarding or rebound.  Musculoskeletal:     Cervical back: Normal range of motion and neck supple. No muscular tenderness.     Right lower leg: No edema.     Left lower leg: No edema.  Skin:    General: Skin is warm and dry.     Coloration: Skin is not pale.  Neurological:     Mental Status: He is alert. Mental status is at baseline.  Psychiatric:        Mood and Affect: Mood normal.    ED Results / Procedures / Treatments   Labs (all labs ordered are  listed, but only abnormal results are displayed) Labs Reviewed  BASIC METABOLIC PANEL - Abnormal; Notable for the following components:      Result Value   Glucose, Bld 169 (*)    BUN 24 (*)    All other components within normal limits  TROPONIN I (HIGH SENSITIVITY) - Abnormal; Notable for the following components:   Troponin I (High Sensitivity) 80 (*)    All other components within normal limits  TROPONIN I (HIGH SENSITIVITY) - Abnormal; Notable for the following components:   Troponin I (High Sensitivity) 74 (*)    All other components within normal limits  RESP PANEL BY RT-PCR (FLU A&B, COVID) ARPGX2  CBC WITH DIFFERENTIAL/PLATELET  HIV ANTIBODY (ROUTINE TESTING W REFLEX)  MAGNESIUM  HEPATIC FUNCTION PANEL  TSH  T4, FREE  HEMOGLOBIN A1C    EKG EKG  Interpretation  Date/Time:  Saturday January 27 2021 10:22:54 EST Ventricular Rate:  70 PR Interval:  162 QRS Duration: 120 QT Interval:  438 QTC Calculation: 473 R Axis:   81 Text Interpretation: Normal sinus rhythm Right bundle branch block Cannot rule out Anterior infarct , age undetermined No significant change since last tracing Confirmed by Blanchie Dessert [phone]) on 01/27/2021 10:55:35 AM  Radiology DG Chest 2 View  Result Date: 01/27/2021 CLINICAL DATA:  Chest pain EXAM: CHEST - 2 VIEW COMPARISON:  Chest x-ray 12/23/2017 FINDINGS: Heart size and mediastinal contours are within normal limits. Cardiac surgical changes and median sternotomy wires. No suspicious pulmonary opacities identified. No pleural effusion or pneumothorax visualized. No acute osseous abnormality appreciated. IMPRESSION: No acute intrathoracic process identified. Electronically Signed   By: Ofilia Neas M.D.   On: 01/27/2021 11:11    Procedures Procedures   Medications Ordered in ED Medications  traMADol (ULTRAM) tablet 50-100 mg (has no administration in time range)  carvedilol (COREG) tablet 6.25 mg (has no administration in time range)   icosapent Ethyl (VASCEPA) 1 g capsule 2 g (has no administration in time range)  lisinopril (ZESTRIL) tablet 40 mg (has no administration in time range)  sertraline (ZOLOFT) tablet 50 mg (has no administration in time range)  zolpidem (AMBIEN) tablet 5 mg (has no administration in time range)  insulin glargine (2 Unit Dial) (TOUJEO MAX) Solostar Pen SOPN 15 Units (has no administration in time range)  empagliflozin (JARDIANCE) tablet 25 mg (has no administration in time range)  docusate sodium (COLACE) capsule 100 mg (has no administration in time range)  famotidine (PEPCID) tablet 20 mg (has no administration in time range)  polyethylene glycol powder (GLYCOLAX/MIRALAX) container 17 g (has no administration in time range)  senna-docusate (Senokot-S) tablet 1 tablet (has no administration in time range)  prasugrel (EFFIENT) tablet 10 mg (has no administration in time range)  methocarbamol (ROBAXIN) tablet 500 mg (has no administration in time range)  pregabalin (LYRICA) capsule 150 mg (has no administration in time range)  ascorbic acid (VITAMIN C) tablet 1,000 mg (has no administration in time range)  cholecalciferol (VITAMIN D3) tablet 1,000 Units (has no administration in time range)  aspirin EC tablet 81 mg (has no administration in time range)  nitroGLYCERIN (NITROSTAT) SL tablet 0.4 mg (has no administration in time range)  acetaminophen (TYLENOL) tablet 650 mg (has no administration in time range)  ondansetron (ZOFRAN) injection 4 mg (has no administration in time range)  0.9 %  sodium chloride infusion (has no administration in time range)  nitroGLYCERIN 50 mg in dextrose 5 % 250 mL (0.2 mg/mL) infusion (has no administration in time range)  insulin aspart (novoLOG) injection 0-15 Units (has no administration in time range)  insulin aspart (novoLOG) injection 0-5 Units (has no administration in time range)  aspirin chewable tablet 324 mg (324 mg Oral Given 01/27/21 1058)    ED  Course  I have reviewed the triage vital signs and the nursing notes.  Pertinent labs & imaging results that were available during my care of the patient were reviewed by me and considered in my medical decision making (see chart for details).  Patient seen and examined. Plan discussed with patient.  EKG reviewed.  Labs: CBC, BMP, troponin  Imaging: Chest x-ray pending  Medications/Fluids: Aspirin given  Vital signs reviewed and are as follows: BP (!) 151/73 (BP Location: Right Arm)   Pulse (!) 59   Temp 98.1 F (36.7 C) (Oral)   Resp  18   SpO2 99%   Initial impression: Symptoms concerning for angina.  Will need cardiology consult.  Awaiting initial troponin.  No signs of ischemia on EKG.  Spoke with Dr. Percival Spanish earlier with heart care group.  2:08 PM Troponin 80 >> 74. Cardiology admitting.      MDM Rules/Calculators/A&P                           Admit.   Final Clinical Impression(s) / ED Diagnoses Final diagnoses:  Unstable angina [facility])    Rx / DC Orders ED Discharge Orders     None        Carlisle Cater, PA-C 01/27/21 1411    Blanchie Dessert, MD 01/28/21 1443

## 2021-01-27 NOTE — ED Triage Notes (Signed)
Patient complains of chest pain and left wrist pain. Patient reports that he has had intermittent chest pain since October. Has more symptoms when he exercises. Alert and oriented. No discomfoprt on arrival

## 2021-01-28 ENCOUNTER — Inpatient Hospital Stay (HOSPITAL_COMMUNITY): Payer: 59

## 2021-01-28 DIAGNOSIS — R079 Chest pain, unspecified: Secondary | ICD-10-CM

## 2021-01-28 LAB — CBC
HCT: 42.5 % (ref 39.0–52.0)
Hemoglobin: 14.5 g/dL (ref 13.0–17.0)
MCH: 30 pg (ref 26.0–34.0)
MCHC: 34.1 g/dL (ref 30.0–36.0)
MCV: 87.8 fL (ref 80.0–100.0)
Platelets: 205 10*3/uL (ref 150–400)
RBC: 4.84 MIL/uL (ref 4.22–5.81)
RDW: 12.9 % (ref 11.5–15.5)
WBC: 6.5 10*3/uL (ref 4.0–10.5)
nRBC: 0 % (ref 0.0–0.2)

## 2021-01-28 LAB — BASIC METABOLIC PANEL
Anion gap: 11 (ref 5–15)
BUN: 20 mg/dL (ref 8–23)
CO2: 21 mmol/L — ABNORMAL LOW (ref 22–32)
Calcium: 8.6 mg/dL — ABNORMAL LOW (ref 8.9–10.3)
Chloride: 101 mmol/L (ref 98–111)
Creatinine, Ser: 0.87 mg/dL (ref 0.61–1.24)
GFR, Estimated: >60 mL/min (ref >60)
Glucose, Bld: 114 mg/dL — ABNORMAL HIGH (ref 70–99)
Potassium: 3.8 mmol/L (ref 3.5–5.1)
Sodium: 133 mmol/L — ABNORMAL LOW (ref 135–145)

## 2021-01-28 LAB — GLUCOSE, CAPILLARY
Glucose-Capillary: 108 mg/dL — ABNORMAL HIGH (ref 70–99)
Glucose-Capillary: 139 mg/dL — ABNORMAL HIGH (ref 70–99)
Glucose-Capillary: 194 mg/dL — ABNORMAL HIGH (ref 70–99)
Glucose-Capillary: 129 mg/dL — ABNORMAL HIGH (ref 70–99)

## 2021-01-28 LAB — ECHOCARDIOGRAM COMPLETE
AR max vel: 1.72 cm2
AV Area VTI: 1.88 cm2
AV Area mean vel: 1.73 cm2
AV Mean grad: 6 mmHg
AV Peak grad: 13 mmHg
Ao pk vel: 1.8 m/s
Area-P 1/2: 3.19 cm2
Height: 73 in
S' Lateral: 3.5 cm
Weight: 4388.8 oz

## 2021-01-28 LAB — LIPID PANEL
Cholesterol: 106 mg/dL (ref 0–200)
HDL: 34 mg/dL — ABNORMAL LOW (ref >40)
LDL Cholesterol: 32 mg/dL (ref 0–99)
Total CHOL/HDL Ratio: 3.1 RATIO
Triglycerides: 198 mg/dL — ABNORMAL HIGH (ref <150)
VLDL: 40 mg/dL (ref 0–40)

## 2021-01-28 LAB — HEPARIN LEVEL (UNFRACTIONATED)
Heparin Unfractionated: 0.12 IU/mL — ABNORMAL LOW (ref 0.30–0.70)
Heparin Unfractionated: 0.24 IU/mL — ABNORMAL LOW (ref 0.30–0.70)
Heparin Unfractionated: 0.29 IU/mL — ABNORMAL LOW (ref 0.30–0.70)

## 2021-01-28 MED ORDER — ASPIRIN 81 MG PO CHEW
81.0000 mg | CHEWABLE_TABLET | ORAL | Status: AC
  Administered 2021-01-29: 81 mg via ORAL
  Filled 2021-01-28: qty 1

## 2021-01-28 MED ORDER — PRASUGREL HCL 10 MG PO TABS
10.0000 mg | ORAL_TABLET | Freq: Every day | ORAL | Status: DC
  Administered 2021-01-30: 10 mg via ORAL
  Filled 2021-01-28: qty 1

## 2021-01-28 MED ORDER — HEPARIN BOLUS VIA INFUSION
2000.0000 [IU] | Freq: Once | INTRAVENOUS | Status: AC
  Administered 2021-01-28: 2000 [IU] via INTRAVENOUS
  Filled 2021-01-28: qty 2000

## 2021-01-28 MED ORDER — PRASUGREL HCL 10 MG PO TABS
10.0000 mg | ORAL_TABLET | ORAL | Status: AC
  Administered 2021-01-29: 10 mg via ORAL
  Filled 2021-01-28: qty 1

## 2021-01-28 MED ORDER — SODIUM CHLORIDE 0.9 % WEIGHT BASED INFUSION
1.0000 mL/kg/h | INTRAVENOUS | Status: DC
  Administered 2021-01-29: 1 mL/kg/h via INTRAVENOUS

## 2021-01-28 MED ORDER — SODIUM CHLORIDE 0.9% FLUSH: 3.0000 mL | INTRAVENOUS | Status: DC | PRN

## 2021-01-28 MED ORDER — HEPARIN BOLUS VIA INFUSION
1500.0000 [IU] | Freq: Once | INTRAVENOUS | Status: AC
  Administered 2021-01-28: 1500 [IU] via INTRAVENOUS
  Filled 2021-01-28: qty 1500

## 2021-01-28 MED ORDER — INSULIN GLARGINE-YFGN 100 UNIT/ML ~~LOC~~ SOLN
15.0000 [IU] | Freq: Every day | SUBCUTANEOUS | Status: DC
  Administered 2021-01-28 – 2021-01-29 (×2): 15 [IU] via SUBCUTANEOUS
  Filled 2021-01-28 (×4): qty 0.15

## 2021-01-28 MED ORDER — SODIUM CHLORIDE 0.9 % WEIGHT BASED INFUSION
3.0000 mL/kg/h | INTRAVENOUS | Status: DC
  Administered 2021-01-29: 3 mL/kg/h via INTRAVENOUS

## 2021-01-28 MED ORDER — SODIUM CHLORIDE 0.9 % IV SOLN: 250.0000 mL | INTRAVENOUS | Status: DC | PRN

## 2021-01-28 MED ORDER — PERFLUTREN LIPID MICROSPHERE
1.0000 mL | INTRAVENOUS | Status: AC | PRN
Start: 2021-01-28 — End: 2021-01-28
  Administered 2021-01-28: 4 mL via INTRAVENOUS
  Filled 2021-01-28: qty 10

## 2021-01-28 NOTE — Progress Notes (Signed)
[place] for heparin Indication: chest pain/ACS  Allergies  Allergen Reactions   Victoza [Liraglutide] Other (See Comments)    pancreatitis   Brilinta [Ticagrelor] Other (See Comments)    Dyspnea   Codeine Other (See Comments)    hyperactive   Other Nausea And Vomiting    Mayonnaise causes nausea and vomiting   Statins Other (See Comments)    Muscle weakness & fatigue    Patient Measurements: Height: 6\' 1"  (185.4 cm) Weight: 124.4 kg (274 lb 4.8 oz) IBW/kg (Calculated) : 79.9 Heparin Dosing Weight: 108 kg   Vital Signs: Temp: 97.7 F (36.5 C) (12/11 0433) Temp Source: Oral (12/11 0433) BP: 156/84 (12/11 1041) Pulse Rate: 76 (12/11 0957)  Labs: Recent Labs    01/27/21 1035 01/27/21 1235 01/27/21 1813 01/27/21 2040 01/28/21 0323 01/28/21 1022  HGB 14.5  --   --   --  14.5  --   HCT 45.6  --   --   --  42.5  --   PLT 235  --   --   --  205  --   HEPARINUNFRC  --   --   --  0.12* 0.12* 0.24*  CREATININE 0.88  --   --   --  0.87  --   TROPONINIHS 80* 74* 68*  --   --   --      Estimated Creatinine Clearance: 120.1 mL/min (by C-G formula based on SCr of 0.87 mg/dL).   Medical History: Past Medical History:  Diagnosis Date   Anxiety attack    Arthritis    "back; shoulders; knees" (07/23/2013)   Back pain, chronic    "all over" (07/23/2013)   Benign neoplasm of colon 04/01/2000   Hyperplastic   Coronary artery disease    a. NSTEMI 10/12: s/p CABG with L-LAD, S-OM1/OM2, S-RV marg/RCA;  b. 07/2013 Cath/PCI: LM <10, LAD 70-80p, 110m, D1 90, RI 30ost, LCX 40-50p, 74m, OM1 90, OM2 100, RCA 100p, VG->OM1->OM2 patent with 90 @ anast (2.5x12 Xience Alpine DES), RCA 100p, LIMA->LAD nl, VG->Diag nl, VG->RV marginal/RCA 90p (3.0x23 Xience Alpine DES);  c. 07/2013 Echo: EF 55-60%.   Depression    "pain related"   DM2 (diabetes mellitus, type 2) (HCC)    GERD (gastroesophageal reflux disease)    Hyperlipidemia    Hypertension    IBS  (irritable bowel syndrome)    Morbidly obese (HCC)     Medications:  Medications Prior to Admission  Medication Sig Dispense Refill Last Dose   Ascorbic Acid (VITAMIN C) 1000 MG tablet Take 1,000 mg by mouth daily.   01/27/2021   aspirin EC 81 MG tablet Take 81 mg by mouth daily.   01/27/2021   azithromycin (ZITHROMAX) 250 MG tablet Take 2 tablets by mouth on the first day and then one a day until gone (Patient taking differently: Take 250-500 mg by mouth daily as needed (urinary infections).) 6 tablet 0 More than a month ago   carvedilol (COREG) 6.25 MG tablet TAKE 1 TABLET BY MOUTH TWICE DAILY (Patient taking differently: Take 6.25 mg by mouth 2 (two) times daily.) 180 tablet 2 01/27/2021 at 0600   cholecalciferol (VITAMIN D) 1000 UNITS tablet Take 1,000 Units by mouth daily.   01/27/2021   ciprofloxacin (CIPRO) 500 MG tablet Take 1 tablet (500 mg total) by mouth every 12 (twelve) hours. (Patient taking differently: Take 500 mg by mouth every 12 (twelve) hours as needed (urinary infections).) 14 tablet 0 More than a  month ago   docusate sodium (COLACE) 100 MG capsule Take 100 mg by mouth 2 (two) times daily.   01/27/2021   empagliflozin (JARDIANCE) 25 MG TABS tablet Take 1 tablet by mouth once a day (Patient taking differently: Take 25 mg by mouth daily.) 90 tablet 3 01/27/2021   Evolocumab (REPATHA SURECLICK) 270 MG/ML SOAJ Inject 140mg  every 2 weeks Subcutaneous 30 days (Patient taking differently: Inject 140 mg into the skin every 14 (fourteen) days.) 4 mL 11 01/18/2021   famotidine (PEPCID) 20 MG tablet TAKE 2 TABLETS BY MOUTH TWO TIMES DAILY (Patient taking differently: Take 40 mg by mouth 2 (two) times daily.) 360 tablet 3 01/27/2021   HUMALOG KWIKPEN 100 UNIT/ML KwikPen Inject 4 Units into the skin 3 (three) times daily as needed (high blood sugar level). With meals as directed   01/26/2021   icosapent Ethyl (VASCEPA) 1 g capsule Take 2 capsules by mouth with meals twice a day (Patient  taking differently: Take 2 g by mouth in the morning and at bedtime.) 360 capsule 3 01/27/2021   insulin glargine, 2 Unit Dial, (TOUJEO MAX SOLOSTAR) 300 UNIT/ML Solostar Pen Inject 32 units into the skin daily and increase as directed in clinic up to 100 units. (Patient taking differently: Inject 32 Units into the skin daily.) 12 mL 11 01/27/2021   lisinopril (PRINIVIL,ZESTRIL) 40 MG tablet Take 1 tablet (40 mg total) by mouth daily. 30 tablet 6 01/27/2021   metFORMIN (GLUCOPHAGE-XR) 500 MG 24 hr tablet Take 1 tablet by mouth twice a day (Patient taking differently: Take 500 mg by mouth in the morning and at bedtime.) 180 tablet 3 01/27/2021   methocarbamol (ROBAXIN) 500 MG tablet TAKE 1 TABLET BY MOUTH 3 TIMES DAILY AS NEEDED (Patient taking differently: Take 500 mg by mouth 3 (three) times daily.) 270 tablet 3 01/27/2021   metroNIDAZOLE (METROGEL) 1 % gel APPLY TO AFFECTED AREAS EXTERNALLY DAILY (Patient taking differently: Apply 1 application topically daily as needed (rosacea flares).) 60 g 6 Past Month   nitroGLYCERIN (NITROSTAT) 0.4 MG SL tablet Place 1 tablet (0.4 mg total) under the tongue every 5 (five) minutes as needed for chest pain (CP or SOB). 25 tablet 3 More than a month ago   polyethylene glycol powder (GLYCOLAX/MIRALAX) 17 GM/SCOOP powder Take 17 g by mouth daily as needed (constipation).   Past Month   prasugrel (EFFIENT) 10 MG TABS tablet Take 1 tablet (10 mg total) by mouth daily. 90 tablet 3 01/27/2021   pregabalin (LYRICA) 150 MG capsule Take 1 capsule by mouth twice a day (Patient taking differently: Take 150 mg by mouth 2 (two) times daily.) 60 capsule 0 01/27/2021   Probiotic Product (PROBIOTIC PO) Take 1 capsule by mouth daily. Keifer probiotic   01/27/2021   pyridOXINE (VITAMIN B-6) 100 MG tablet Take 100 mg by mouth daily.   01/27/2021   sertraline (ZOLOFT) 50 MG tablet TAKE 1 TABLET BY MOUTH TWICE DAILY 180 tablet 2 01/27/2021   traMADol (ULTRAM) 50 MG tablet Take 1-2  tablets (50-100 mg total) by mouth every 6 (six) hours as needed for pain. (Patient taking differently: Take 50-100 mg by mouth every 6 (six) hours.) 720 tablet 3 01/27/2021   zolpidem (AMBIEN) 10 MG tablet Take 1 tablet by mouth at bedtime as needed for insomnia (Patient taking differently: Take 10 mg by mouth at bedtime.) 30 tablet 0 01/26/2021   COVID-19 mRNA vaccine, Moderna, 100 MCG/0.5ML injection INJECT AS DIRECTED .25 mL 0    COVID-19 mRNA  vaccine, Moderna, 100 MCG/0.5ML injection Inject into the muscle. 0.25 mL 0    Evolocumab 140 MG/ML SOAJ INJECT 140 MG INTO THE SKIN EVERY 14 DAYS. (Patient not taking: Reported on 01/28/2021) 2 mL 11 Not Taking   FREESTYLE LITE test strip       influenza vac split quadrivalent PF (FLUARIX) 0.5 ML injection Inject into the muscle. (Patient not taking: Reported on 01/28/2021) 0.5 mL 0 Not Taking   insulin lispro (HUMALOG KWIKPEN) 100 UNIT/ML KwikPen Inject 4 units Subcutaneous with meals (Patient not taking: Reported on 01/28/2021) 12 mL 3 Not Taking   Insulin Pen Needle 32G X 6 MM MISC Use with Victoza daily. 100 each 3    lisinopril (ZESTRIL) 40 MG tablet 1 tablet Orally Once a day 90 days (Patient not taking: Reported on 01/28/2021) 90 tablet 3 Not Taking   oxycodone (OXY-IR) 5 MG capsule Take 1 capsule (5 mg total) by mouth every 4 (four) hours as needed. (Patient not taking: Reported on 01/28/2021) 15 capsule 0 Not Taking   PENTIPS 31G X 5 MM MISC       pregabalin (LYRICA) 150 MG capsule Take 1 capsule by mouth twice a day (Patient not taking: Reported on 01/28/2021) 60 capsule 0 Not Taking   pregabalin (LYRICA) 150 MG capsule Take 1 capsule (150 mg total) by mouth 2 (two) times daily. (Patient not taking: Reported on 01/28/2021) 180 capsule 3 Not Taking   senna-docusate (SENOKOT-S) 8.6-50 MG tablet Take 1 tablet by mouth at bedtime as needed for mild constipation. (Patient not taking: Reported on 01/28/2021) 30 tablet 0 Not Taking   zolpidem (AMBIEN) 10  MG tablet Take 1 tablet (10 mg total) by mouth at bedtime as needed for sleep. (Patient not taking: Reported on 01/28/2021) 90 tablet 3 Not Taking    Assessment: 12 YOM with h/o CABG who presents with chest pain. Pharmacy consulted to start IV heparin for ACS. Plans noted for cardiac cath on Monday.  Heparin level 0.24 and subtherapeutic on 1550 units/h. Hemoglobin and platelets remain stable. No signs of bleeding or IV site issues per RN.   Goal of Therapy:  Heparin level 0.3-0.7 units/ml Monitor platelets by anticoagulation protocol: Yes   Plan:  IV heparin 1500 units bolus x 1 Increase IV heparin gtt to 1750 units/h 6 hour heparin level Daily heparin level, CBC Monitor for s/sx of bleeding Will follow plans post cath  Thank you for involving pharmacy in this patient's care.  Elita Quick, PharmD PGY1 Ambulatory Care Pharmacy Resident 01/28/2021 1:24 PM  **Pharmacist phone directory can be found on [email or web address] listed under [place]**

## 2021-01-28 NOTE — Progress Notes (Signed)
Progress Note  Patient Name: Spencer [name] Date of Encounter: 01/28/2021  Primary Cardiologist:   Kathlyn Sacramento, MD   Subjective   He has had no further chest pain.  He feels constipated.   Inpatient Medications    Scheduled Meds:  vitamin C  1,000 mg Oral Daily   aspirin EC  81 mg Oral Daily   carvedilol  6.25 mg Oral BID   cholecalciferol  1,000 Units Oral Daily   docusate sodium  100 mg Oral BID   empagliflozin  25 mg Oral Daily   famotidine  20 mg Oral BID   icosapent Ethyl  2 g Oral BID   insulin aspart  0-15 Units Subcutaneous TID WC   insulin aspart  0-5 Units Subcutaneous QHS   insulin glargine-yfgn  15 Units Subcutaneous Daily   lisinopril  40 mg Oral Daily   methocarbamol  500 mg Oral Q8H   prasugrel  10 mg Oral Daily   pregabalin  150 mg Oral BID   sertraline  50 mg Oral BID   sodium chloride flush  3 mL Intravenous Q12H   zolpidem  5 mg Oral QHS   Continuous Infusions:  sodium chloride 10 mL/hr at 01/27/21 1443   heparin 1,550 Units/hr (01/28/21 0431)   nitroGLYCERIN 5 mcg/min (01/27/21 1447)   PRN Meds: acetaminophen, nitroGLYCERIN, ondansetron (ZOFRAN) IV, polyethylene glycol, senna-docusate, traMADol   Vital Signs    Vitals:   01/27/21 1900 01/28/21 0433 01/28/21 0957 01/28/21 1041  BP: (!) 149/69 (!) 151/71 (!) 156/84 (!) 156/84  Pulse: 64 70 76   Resp: 18 17    Temp: 98.4 F (36.9 C) 97.7 F (36.5 C)    TempSrc: Oral Oral    SpO2: 98% 96% 97%   Weight:      Height:        Intake/Output Summary (Last 24 hours) at 01/28/2021 1130 Last data filed at 01/28/2021 0904 Gross per 24 hour  Intake 893.17 ml  Output 3125 ml  Net -2231.83 ml   Filed Weights   01/27/21 1629  Weight: 124.4 kg    Telemetry    NSR - Personally Reviewed  ECG    NA - Personally Reviewed  Physical Exam   GEN: No acute distress.   Neck: No  JVD Cardiac: RRR, no murmurs, rubs, or gallops.  Respiratory: Clear  to auscultation bilaterally. GI: Soft,  nontender, non-distended  MS: No  edema; No deformity. Neuro:  Nonfocal  Psych: Normal affect   Labs    Chemistry Recent Labs  Lab 01/27/21 1035 01/27/21 1400 01/28/21 0323  NA 136  --  133*  K 4.9  --  3.8  CL 103  --  101  CO2 23  --  21*  GLUCOSE 169*  --  114*  BUN 24*  --  20  CREATININE 0.88  --  0.87  CALCIUM 9.0  --  8.6*  PROT  --  7.3  --   ALBUMIN  --  3.6  --   AST  --  26  --   ALT  --  22  --   ALKPHOS  --  62  --   BILITOT  --  0.7  --   GFRNONAA >60  --  >60  ANIONGAP 10  --  11     Hematology Recent Labs  Lab 01/27/21 1035 01/28/21 0323  WBC 6.3 6.5  RBC 5.01 4.84  HGB 14.5 14.5  HCT 45.6 42.5  MCV 91.0  87.8  MCH 28.9 30.0  MCHC 31.8 34.1  RDW 12.9 12.9  PLT 235 205    Cardiac EnzymesNo results for input(s): TROPONINI in the last 168 hours. No results for input(s): TROPIPOC in the last 168 hours.   BNPNo results for input(s): BNP, PROBNP in the last 168 hours.   DDimer No results for input(s): DDIMER in the last 168 hours.   Radiology    DG Chest 2 View  Result Date: 01/27/2021 CLINICAL DATA:  Chest pain EXAM: CHEST - 2 VIEW COMPARISON:  Chest x-ray 12/23/2017 FINDINGS: Heart size and mediastinal contours are within normal limits. Cardiac surgical changes and median sternotomy wires. No suspicious pulmonary opacities identified. No pleural effusion or pneumothorax visualized. No acute osseous abnormality appreciated. IMPRESSION: No acute intrathoracic process identified. Electronically Signed   By: Ofilia Neas M.D.   On: 01/27/2021 11:11    Cardiac Studies   ECHO:  Pending  Patient Profile     65 y.o. male with CAD with CABG 2012 ICM EF 45%, and DES to VG to OM and VG to RCA in 2015, DM-2, carotid disease, obesity, HLD, intolerant to statins, who is being seen 01/27/2021 for the evaluation of chest pain.  Assessment & Plan    Unstable angina with elevated troponinl.  Plan cath and on the add on board for tomorrow.  Continue  NTG and Heparin.    CAD with hx CABG 2012 and PCI to 2 VG in 2015: See above.    DM-2:  Continue meds as listed.   SSI  DYSLIPIDEMIA:   On [facility] as an out patient.     For questions or updates, please contact [place] Please consult [email or web address] for contact info under Cardiology/STEMI.   Signed, Minus Breeding, MD  01/28/2021, 11:30 AM

## 2021-01-28 NOTE — Progress Notes (Signed)
ANTICOAGULATION CONSULT NOTE  Pharmacy Consult for heparin Indication: chest pain/ACS  Allergies  Allergen Reactions   Victoza [Liraglutide] Other (See Comments)    pancreatitis   Brilinta [Ticagrelor] Other (See Comments)    Dyspnea   Codeine Other (See Comments)    hyperactive   Other Nausea And Vomiting    Mayonnaise causes nausea and vomiting   Statins Other (See Comments)    Muscle weakness & fatigue    Patient Measurements: Height: 6\' 1"  (185.4 cm) Weight: 124.4 kg (274 lb 4.8 oz) IBW/kg (Calculated) : 79.9 Heparin Dosing Weight: 108 kg   Vital Signs: BP: 156/84 (12/11 1041) Pulse Rate: 76 (12/11 0957)  Labs: Recent Labs    01/27/21 1035 01/27/21 1235 01/27/21 1813 01/27/21 2040 01/28/21 0323 01/28/21 1022 01/28/21 1842  HGB 14.5  --   --   --  14.5  --   --   HCT 45.6  --   --   --  42.5  --   --   PLT 235  --   --   --  205  --   --   HEPARINUNFRC  --   --   --    < > 0.12* 0.24* 0.29*  CREATININE 0.88  --   --   --  0.87  --   --   TROPONINIHS 80* 74* 68*  --   --   --   --    < > = values in this interval not displayed.     Estimated Creatinine Clearance: 120.1 mL/min (by C-G formula based on SCr of 0.87 mg/dL).   Medical History: Past Medical History:  Diagnosis Date   Anxiety attack    Arthritis    "back; shoulders; knees" (07/23/2013)   Back pain, chronic    "all over" (07/23/2013)   Benign neoplasm of colon 04/01/2000   Hyperplastic   Coronary artery disease    a. NSTEMI 10/12: s/p CABG with L-LAD, S-OM1/OM2, S-RV marg/RCA;  b. 07/2013 Cath/PCI: LM <10, LAD 70-80p, 98m, D1 90, RI 30ost, LCX 40-50p, 64m, OM1 90, OM2 100, RCA 100p, VG->OM1->OM2 patent with 90 @ anast (2.5x12 Xience Alpine DES), RCA 100p, LIMA->LAD nl, VG->Diag nl, VG->RV marginal/RCA 90p (3.0x23 Xience Alpine DES);  c. 07/2013 Echo: EF 55-60%.   Depression    "pain related"   DM2 (diabetes mellitus, type 2) (HCC)    GERD (gastroesophageal reflux disease)    Hyperlipidemia     Hypertension    IBS (irritable bowel syndrome)    Morbidly obese (HCC)     Medications:  Medications Prior to Admission  Medication Sig Dispense Refill Last Dose   Ascorbic Acid (VITAMIN C) 1000 MG tablet Take 1,000 mg by mouth daily.   01/27/2021   aspirin EC 81 MG tablet Take 81 mg by mouth daily.   01/27/2021   azithromycin (ZITHROMAX) 250 MG tablet Take 2 tablets by mouth on the first day and then one a day until gone (Patient taking differently: Take 250-500 mg by mouth daily as needed (urinary infections).) 6 tablet 0 More than a month ago   carvedilol (COREG) 6.25 MG tablet TAKE 1 TABLET BY MOUTH TWICE DAILY (Patient taking differently: Take 6.25 mg by mouth 2 (two) times daily.) 180 tablet 2 01/27/2021 at 0600   cholecalciferol (VITAMIN D) 1000 UNITS tablet Take 1,000 Units by mouth daily.   01/27/2021   ciprofloxacin (CIPRO) 500 MG tablet Take 1 tablet (500 mg total) by mouth every 12 (twelve) hours. (Patient taking  differently: Take 500 mg by mouth every 12 (twelve) hours as needed (urinary infections).) 14 tablet 0 More than a month ago   docusate sodium (COLACE) 100 MG capsule Take 100 mg by mouth 2 (two) times daily.   01/27/2021   empagliflozin (JARDIANCE) 25 MG TABS tablet Take 1 tablet by mouth once a day (Patient taking differently: Take 25 mg by mouth daily.) 90 tablet 3 01/27/2021   Evolocumab (REPATHA SURECLICK) 262 MG/ML SOAJ Inject 140mg  every 2 weeks Subcutaneous 30 days (Patient taking differently: Inject 140 mg into the skin every 14 (fourteen) days.) 4 mL 11 01/18/2021   famotidine (PEPCID) 20 MG tablet TAKE 2 TABLETS BY MOUTH TWO TIMES DAILY (Patient taking differently: Take 40 mg by mouth 2 (two) times daily.) 360 tablet 3 01/27/2021   HUMALOG KWIKPEN 100 UNIT/ML KwikPen Inject 4 Units into the skin 3 (three) times daily as needed (high blood sugar level). With meals as directed   01/26/2021   icosapent Ethyl (VASCEPA) 1 g capsule Take 2 capsules by mouth with meals twice  a day (Patient taking differently: Take 2 g by mouth in the morning and at bedtime.) 360 capsule 3 01/27/2021   insulin glargine, 2 Unit Dial, (TOUJEO MAX SOLOSTAR) 300 UNIT/ML Solostar Pen Inject 32 units into the skin daily and increase as directed in clinic up to 100 units. (Patient taking differently: Inject 32 Units into the skin daily.) 12 mL 11 01/27/2021   lisinopril (PRINIVIL,ZESTRIL) 40 MG tablet Take 1 tablet (40 mg total) by mouth daily. 30 tablet 6 01/27/2021   metFORMIN (GLUCOPHAGE-XR) 500 MG 24 hr tablet Take 1 tablet by mouth twice a day (Patient taking differently: Take 500 mg by mouth in the morning and at bedtime.) 180 tablet 3 01/27/2021   methocarbamol (ROBAXIN) 500 MG tablet TAKE 1 TABLET BY MOUTH 3 TIMES DAILY AS NEEDED (Patient taking differently: Take 500 mg by mouth 3 (three) times daily.) 270 tablet 3 01/27/2021   metroNIDAZOLE (METROGEL) 1 % gel APPLY TO AFFECTED AREAS EXTERNALLY DAILY (Patient taking differently: Apply 1 application topically daily as needed (rosacea flares).) 60 g 6 Past Month   nitroGLYCERIN (NITROSTAT) 0.4 MG SL tablet Place 1 tablet (0.4 mg total) under the tongue every 5 (five) minutes as needed for chest pain (CP or SOB). 25 tablet 3 More than a month ago   polyethylene glycol powder (GLYCOLAX/MIRALAX) 17 GM/SCOOP powder Take 17 g by mouth daily as needed (constipation).   Past Month   prasugrel (EFFIENT) 10 MG TABS tablet Take 1 tablet (10 mg total) by mouth daily. 90 tablet 3 01/27/2021   pregabalin (LYRICA) 150 MG capsule Take 1 capsule by mouth twice a day (Patient taking differently: Take 150 mg by mouth 2 (two) times daily.) 60 capsule 0 01/27/2021   Probiotic Product (PROBIOTIC PO) Take 1 capsule by mouth daily. Keifer probiotic   01/27/2021   pyridOXINE (VITAMIN B-6) 100 MG tablet Take 100 mg by mouth daily.   01/27/2021   sertraline (ZOLOFT) 50 MG tablet TAKE 1 TABLET BY MOUTH TWICE DAILY 180 tablet 2 01/27/2021   traMADol (ULTRAM) 50 MG  tablet Take 1-2 tablets (50-100 mg total) by mouth every 6 (six) hours as needed for pain. (Patient taking differently: Take 50-100 mg by mouth every 6 (six) hours.) 720 tablet 3 01/27/2021   zolpidem (AMBIEN) 10 MG tablet Take 1 tablet by mouth at bedtime as needed for insomnia (Patient taking differently: Take 10 mg by mouth at bedtime.) 30 tablet 0 01/26/2021  COVID-19 mRNA vaccine, Moderna, 100 MCG/0.5ML injection INJECT AS DIRECTED .25 mL 0    COVID-19 mRNA vaccine, Moderna, 100 MCG/0.5ML injection Inject into the muscle. 0.25 mL 0    Evolocumab 140 MG/ML SOAJ INJECT 140 MG INTO THE SKIN EVERY 14 DAYS. (Patient not taking: Reported on 01/28/2021) 2 mL 11 Not Taking   FREESTYLE LITE test strip       influenza vac split quadrivalent PF (FLUARIX) 0.5 ML injection Inject into the muscle. (Patient not taking: Reported on 01/28/2021) 0.5 mL 0 Not Taking   insulin lispro (HUMALOG KWIKPEN) 100 UNIT/ML KwikPen Inject 4 units Subcutaneous with meals (Patient not taking: Reported on 01/28/2021) 12 mL 3 Not Taking   Insulin Pen Needle 32G X 6 MM MISC Use with Victoza daily. 100 each 3    lisinopril (ZESTRIL) 40 MG tablet 1 tablet Orally Once a day 90 days (Patient not taking: Reported on 01/28/2021) 90 tablet 3 Not Taking   oxycodone (OXY-IR) 5 MG capsule Take 1 capsule (5 mg total) by mouth every 4 (four) hours as needed. (Patient not taking: Reported on 01/28/2021) 15 capsule 0 Not Taking   PENTIPS 31G X 5 MM MISC       pregabalin (LYRICA) 150 MG capsule Take 1 capsule by mouth twice a day (Patient not taking: Reported on 01/28/2021) 60 capsule 0 Not Taking   pregabalin (LYRICA) 150 MG capsule Take 1 capsule (150 mg total) by mouth 2 (two) times daily. (Patient not taking: Reported on 01/28/2021) 180 capsule 3 Not Taking   senna-docusate (SENOKOT-S) 8.6-50 MG tablet Take 1 tablet by mouth at bedtime as needed for mild constipation. (Patient not taking: Reported on 01/28/2021) 30 tablet 0 Not Taking    zolpidem (AMBIEN) 10 MG tablet Take 1 tablet (10 mg total) by mouth at bedtime as needed for sleep. (Patient not taking: Reported on 01/28/2021) 90 tablet 3 Not Taking    Assessment: 88 YOM with h/o CABG who presents with chest pain. Pharmacy consulted to start IV heparin for ACS. Plans noted for cardiac cath on Monday.  Heparin level 0.29 and subtherapeutic on 1750 units/h.   Goal of Therapy:  Heparin level 0.3-0.7 units/ml Monitor platelets by anticoagulation protocol: Yes   Plan:  Increase IV heparin gtt to 1850 units/h 6 hour heparin level Daily heparin level, CBC Monitor for s/sx of bleeding Will follow plans post cath  Thank you for involving pharmacy in this patient's care.  Albertina Parr, PharmD., BCPS, BCCCP Clinical Pharmacist Please refer to [facility] for unit-specific pharmacist

## 2021-01-28 NOTE — Progress Notes (Signed)
[facility] for heparin Indication: chest pain/ACS  Allergies  Allergen Reactions   Victoza [Liraglutide] Other (See Comments)    pancreatitis   Brilinta [Ticagrelor]     Dyspnea   Codeine Other (See Comments)    hyperactive   Other Nausea And Vomiting    Mayonnaise causes nausea and vomiting   Statins     Muscle weakness & fatigue    Patient Measurements: Height: 6\' 1"  (185.4 cm) Weight: 124.4 kg (274 lb 4.8 oz) IBW/kg (Calculated) : 79.9 Heparin Dosing Weight: 108 kg   Vital Signs: Temp: 98.4 F (36.9 C) (12/10 1900) Temp Source: Oral (12/10 1900) BP: 149/69 (12/10 1900) Pulse Rate: 64 (12/10 1900)  Labs: Recent Labs    01/27/21 1035 01/27/21 1235 01/27/21 1813 01/27/21 2040 01/28/21 0323  HGB 14.5  --   --   --  14.5  HCT 45.6  --   --   --  42.5  PLT 235  --   --   --  205  HEPARINUNFRC  --   --   --  0.12* 0.12*  CREATININE 0.88  --   --   --   --   TROPONINIHS 80* 74* 68*  --   --      Estimated Creatinine Clearance: 118.7 mL/min (by C-G formula based on SCr of 0.88 mg/dL).   Medical History: Past Medical History:  Diagnosis Date   Anxiety attack    Arthritis    "back; shoulders; knees" (07/23/2013)   Back pain, chronic    "all over" (07/23/2013)   Benign neoplasm of colon 04/01/2000   Hyperplastic   Coronary artery disease    a. NSTEMI 10/12: s/p CABG with L-LAD, S-OM1/OM2, S-RV marg/RCA;  b. 07/2013 Cath/PCI: LM <10, LAD 70-80p, 35m, D1 90, RI 30ost, LCX 40-50p, 4m, OM1 90, OM2 100, RCA 100p, VG->OM1->OM2 patent with 90 @ anast (2.5x12 Xience Alpine DES), RCA 100p, LIMA->LAD nl, VG->Diag nl, VG->RV marginal/RCA 90p (3.0x23 Xience Alpine DES);  c. 07/2013 Echo: EF 55-60%.   Depression    "pain related"   DM2 (diabetes mellitus, type 2) (HCC)    GERD (gastroesophageal reflux disease)    Hyperlipidemia    Hypertension    IBS (irritable bowel syndrome)    Morbidly obese (HCC)     Medications:  Medications  Prior to Admission  Medication Sig Dispense Refill Last Dose   Ascorbic Acid (VITAMIN C) 1000 MG tablet Take 1,000 mg by mouth daily.      aspirin EC 81 MG tablet Take 81 mg by mouth daily.      azithromycin (ZITHROMAX) 250 MG tablet Take 2 tablets by mouth on the first day and then one a day until gone 6 tablet 0    carvedilol (COREG) 6.25 MG tablet TAKE 1 TABLET BY MOUTH TWICE DAILY 180 tablet 2    cholecalciferol (VITAMIN D) 1000 UNITS tablet Take 1,000 Units by mouth daily.      ciprofloxacin (CIPRO) 500 MG tablet Take 1 tablet (500 mg total) by mouth every 12 (twelve) hours. 14 tablet 0    COVID-19 mRNA vaccine, Moderna, 100 MCG/0.5ML injection INJECT AS DIRECTED .25 mL 0    COVID-19 mRNA vaccine, Moderna, 100 MCG/0.5ML injection Inject into the muscle. 0.25 mL 0    docusate sodium (COLACE) 100 MG capsule Take 100 mg by mouth 2 (two) times daily.      empagliflozin (JARDIANCE) 25 MG TABS tablet Take 1 tablet by mouth once a day  90 tablet 3    Evolocumab (REPATHA SURECLICK) 696 MG/ML SOAJ Inject 140mg  every 2 weeks Subcutaneous 30 days 4 mL 11    Evolocumab 140 MG/ML SOAJ INJECT 140 MG INTO THE SKIN EVERY 14 DAYS. 2 mL 11    famotidine (PEPCID) 20 MG tablet TAKE 2 TABLETS BY MOUTH TWO TIMES DAILY 360 tablet 3    FREESTYLE LITE test strip       HUMALOG KWIKPEN 100 UNIT/ML KwikPen Inject 4 Units into the skin See admin instructions. With meals as directed      icosapent Ethyl (VASCEPA) 1 g capsule Take 2 capsules by mouth with meals twice a day 360 capsule 3    influenza vac split quadrivalent PF (FLUARIX) 0.5 ML injection Inject into the muscle. 0.5 mL 0    Insulin Glargine (BASAGLAR KWIKPEN) 100 UNIT/ML Inject 32 Units into the skin daily.      insulin glargine, 2 Unit Dial, (TOUJEO MAX SOLOSTAR) 300 UNIT/ML Solostar Pen Inject 32 units into the skin daily and increase as directed in clinic up to 100 units. 12 mL 11    insulin lispro (HUMALOG KWIKPEN) 100 UNIT/ML KwikPen Inject 4 units  Subcutaneous with meals 12 mL 3    Insulin Pen Needle 32G X 6 MM MISC Use with Victoza daily. 100 each 3    JARDIANCE 25 MG TABS tablet Take 25 mg by mouth daily.   1    lisinopril (PRINIVIL,ZESTRIL) 40 MG tablet Take 1 tablet (40 mg total) by mouth daily. 30 tablet 6    lisinopril (ZESTRIL) 40 MG tablet 1 tablet Orally Once a day 90 days 90 tablet 3    metFORMIN (GLUCOPHAGE-XR) 500 MG 24 hr tablet Take 1 tablet by mouth twice a day 180 tablet 3    methocarbamol (ROBAXIN) 500 MG tablet Take 500 mg by mouth every 8 (eight) hours. scheduled      methocarbamol (ROBAXIN) 500 MG tablet TAKE 1 TABLET BY MOUTH 3 TIMES DAILY AS NEEDED 270 tablet 3    metroNIDAZOLE (METROGEL) 1 % gel APPLY TO AFFECTED AREAS EXTERNALLY DAILY 60 g 6    nitroGLYCERIN (NITROSTAT) 0.4 MG SL tablet Place 1 tablet (0.4 mg total) under the tongue every 5 (five) minutes as needed for chest pain (CP or SOB). 25 tablet 3    oxycodone (OXY-IR) 5 MG capsule Take 1 capsule (5 mg total) by mouth every 4 (four) hours as needed. 15 capsule 0    PENTIPS 31G X 5 MM MISC       polyethylene glycol powder (GLYCOLAX/MIRALAX) 17 GM/SCOOP powder Take 17 g by mouth daily as needed (constipation).      prasugrel (EFFIENT) 10 MG TABS tablet Take 1 tablet (10 mg total) by mouth daily. 90 tablet 3    pregabalin (LYRICA) 150 MG capsule Take 150 mg by mouth 2 (two) times daily. 0530 & 1730      pregabalin (LYRICA) 150 MG capsule Take 1 capsule by mouth twice a day 60 capsule 0    pregabalin (LYRICA) 150 MG capsule Take 1 capsule by mouth twice a day 60 capsule 0    pregabalin (LYRICA) 150 MG capsule Take 1 capsule (150 mg total) by mouth 2 (two) times daily. 180 capsule 3    Probiotic Product (PROBIOTIC PO) Take 1 each by mouth daily. Keifer liquid yogurt probiotic      pyridOXINE (VITAMIN B-6) 100 MG tablet Take 100 mg by mouth daily.      senna-docusate (SENOKOT-S) 8.6-50 MG tablet  Take 1 tablet by mouth at bedtime as needed for mild constipation. 30  tablet 0    sertraline (ZOLOFT) 50 MG tablet TAKE 1 TABLET BY MOUTH TWICE DAILY 180 tablet 2    traMADol (ULTRAM) 50 MG tablet Take 100 mg by mouth in the morning, at noon, and at bedtime. scheduled      traMADol (ULTRAM) 50 MG tablet Take 1-2 tablets (50-100 mg total) by mouth every 6 (six) hours as needed for pain. 720 tablet 3    zolpidem (AMBIEN) 10 MG tablet Take 5 mg by mouth at bedtime.      zolpidem (AMBIEN) 10 MG tablet Take 1 tablet by mouth at bedtime as needed for insomnia 30 tablet 0    zolpidem (AMBIEN) 10 MG tablet Take 1 tablet (10 mg total) by mouth at bedtime as needed for sleep. 90 tablet 3     Assessment: 30 YOM with h/o CABG who presents with chest pain. Pharmacy consulted to start IV heparin for ACS. Plans noted for cardiac cath -heparin level = 0.2 on 1300 units/hr    Goal of Therapy:  Heparin level 0.3-0.7 units/ml Monitor platelets by anticoagulation protocol: Yes   Plan:  -heparin 2000 units IV bolus then increase infusion to 1550 units/hr -Will follow plans post cath  Hildred Laser, PharmD Clinical Pharmacist **Pharmacist phone directory can now be found on [email or web address] ([email or web address]).  Listed under [place].    Please refer to [facility] for unit-specific pharmacist

## 2021-01-28 NOTE — Progress Notes (Signed)
*  PRELIMINARY RESULTS* Echocardiogram 2D Echocardiogram has been performed.  Spencer [name] 01/28/2021, 12:29 PM

## 2021-01-29 ENCOUNTER — Encounter (HOSPITAL_COMMUNITY)
Admission: EM | Disposition: A | Discharge status: Home / Self Care | Payer: Self-pay | Source: Home / Self Care | Attending: Cardiology

## 2021-01-29 ENCOUNTER — Encounter (HOSPITAL_COMMUNITY): Payer: Self-pay | Admitting: Cardiology

## 2021-01-29 DIAGNOSIS — E119 Type 2 diabetes mellitus without complications: Secondary | ICD-10-CM

## 2021-01-29 DIAGNOSIS — Z794 Long term (current) use of insulin: Secondary | ICD-10-CM

## 2021-01-29 DIAGNOSIS — E782 Mixed hyperlipidemia: Secondary | ICD-10-CM

## 2021-01-29 DIAGNOSIS — E1159 Type 2 diabetes mellitus with other circulatory complications: Secondary | ICD-10-CM

## 2021-01-29 DIAGNOSIS — I152 Hypertension secondary to endocrine disorders: Secondary | ICD-10-CM

## 2021-01-29 DIAGNOSIS — I25710 Atherosclerosis of autologous vein coronary artery bypass graft(s) with unstable angina pectoris: Secondary | ICD-10-CM

## 2021-01-29 DIAGNOSIS — I25110 Atherosclerotic heart disease of native coronary artery with unstable angina pectoris: Principal | ICD-10-CM

## 2021-01-29 HISTORY — PX: LEFT HEART CATH AND CORS/GRAFTS ANGIOGRAPHY: CATH118250

## 2021-01-29 LAB — HEPARIN LEVEL (UNFRACTIONATED): Heparin Unfractionated: 0.54 IU/mL (ref 0.30–0.70)

## 2021-01-29 LAB — BASIC METABOLIC PANEL
Anion gap: 9 (ref 5–15)
BUN: 21 mg/dL (ref 8–23)
CO2: 20 mmol/L — ABNORMAL LOW (ref 22–32)
Calcium: 8.5 mg/dL — ABNORMAL LOW (ref 8.9–10.3)
Chloride: 104 mmol/L (ref 98–111)
Creatinine, Ser: 0.87 mg/dL (ref 0.61–1.24)
GFR, Estimated: >60 mL/min (ref >60)
Glucose, Bld: 115 mg/dL — ABNORMAL HIGH (ref 70–99)
Potassium: 3.8 mmol/L (ref 3.5–5.1)
Sodium: 133 mmol/L — ABNORMAL LOW (ref 135–145)

## 2021-01-29 LAB — GLUCOSE, CAPILLARY
Glucose-Capillary: 105 mg/dL — ABNORMAL HIGH (ref 70–99)
Glucose-Capillary: 122 mg/dL — ABNORMAL HIGH (ref 70–99)
Glucose-Capillary: 168 mg/dL — ABNORMAL HIGH (ref 70–99)
Glucose-Capillary: 137 mg/dL — ABNORMAL HIGH (ref 70–99)

## 2021-01-29 LAB — CBC
HCT: 43.7 % (ref 39.0–52.0)
Hemoglobin: 14.4 g/dL (ref 13.0–17.0)
MCH: 29.1 pg (ref 26.0–34.0)
MCHC: 33 g/dL (ref 30.0–36.0)
MCV: 88.3 fL (ref 80.0–100.0)
Platelets: 202 10*3/uL (ref 150–400)
RBC: 4.95 MIL/uL (ref 4.22–5.81)
RDW: 13 % (ref 11.5–15.5)
WBC: 6.9 10*3/uL (ref 4.0–10.5)
nRBC: 0 % (ref 0.0–0.2)

## 2021-01-29 SURGERY — LEFT HEART CATH AND CORS/GRAFTS ANGIOGRAPHY
Anesthesia: LOCAL

## 2021-01-29 MED ORDER — HYDRALAZINE HCL 20 MG/ML IJ SOLN
INTRAMUSCULAR | Status: DC | PRN
  Administered 2021-01-29: 10 mg via INTRAVENOUS

## 2021-01-29 MED ORDER — SODIUM CHLORIDE 0.9 % IV SOLN: INTRAVENOUS | Status: AC

## 2021-01-29 MED ORDER — MIDAZOLAM HCL 2 MG/2ML IJ SOLN
INTRAMUSCULAR | Status: AC
  Filled 2021-01-29: qty 2

## 2021-01-29 MED ORDER — HYDRALAZINE HCL 20 MG/ML IJ SOLN
INTRAMUSCULAR | Status: AC
  Filled 2021-01-29: qty 1

## 2021-01-29 MED ORDER — LABETALOL HCL 5 MG/ML IV SOLN: 10.0000 mg | INTRAVENOUS | Status: AC | PRN

## 2021-01-29 MED ORDER — HEPARIN SODIUM (PORCINE) 1000 UNIT/ML IJ SOLN
INTRAMUSCULAR | Status: AC
  Filled 2021-01-29: qty 10

## 2021-01-29 MED ORDER — VERAPAMIL HCL 2.5 MG/ML IV SOLN
INTRAVENOUS | Status: AC
  Filled 2021-01-29: qty 2

## 2021-01-29 MED ORDER — FENTANYL CITRATE (PF) 100 MCG/2ML IJ SOLN
INTRAMUSCULAR | Status: DC | PRN
  Administered 2021-01-29: 50 ug via INTRAVENOUS
  Administered 2021-01-29: 25 ug via INTRAVENOUS

## 2021-01-29 MED ORDER — HEPARIN (PORCINE) IN NACL 1000-0.9 UT/500ML-% IV SOLN
INTRAVENOUS | Status: DC | PRN
  Administered 2021-01-29 (×2): 500 mL

## 2021-01-29 MED ORDER — SODIUM CHLORIDE 0.9% FLUSH
3.0000 mL | Freq: Two times a day (BID) | INTRAVENOUS | Status: DC
  Administered 2021-01-29 – 2021-01-30 (×2): 3 mL via INTRAVENOUS

## 2021-01-29 MED ORDER — VERAPAMIL HCL 2.5 MG/ML IV SOLN
INTRAVENOUS | Status: DC | PRN
  Administered 2021-01-29: 10 mL via INTRA_ARTERIAL

## 2021-01-29 MED ORDER — IOHEXOL 350 MG/ML SOLN
INTRAVENOUS | Status: DC | PRN
  Administered 2021-01-29: 120 mL

## 2021-01-29 MED ORDER — MIDAZOLAM HCL 2 MG/2ML IJ SOLN
INTRAMUSCULAR | Status: DC | PRN
  Administered 2021-01-29: 1 mg via INTRAVENOUS
  Administered 2021-01-29: 2 mg via INTRAVENOUS

## 2021-01-29 MED ORDER — HYDRALAZINE HCL 20 MG/ML IJ SOLN: 10.0000 mg | INTRAMUSCULAR | Status: AC | PRN

## 2021-01-29 MED ORDER — SODIUM CHLORIDE 0.9 % IV SOLN: 250.0000 mL | INTRAVENOUS | Status: DC | PRN

## 2021-01-29 MED ORDER — HEPARIN SODIUM (PORCINE) 1000 UNIT/ML IJ SOLN
INTRAMUSCULAR | Status: DC | PRN
  Administered 2021-01-29: 6000 [IU] via INTRAVENOUS

## 2021-01-29 MED ORDER — SODIUM CHLORIDE 0.9% FLUSH: 3.0000 mL | INTRAVENOUS | Status: DC | PRN

## 2021-01-29 MED ORDER — HEPARIN (PORCINE) IN NACL 1000-0.9 UT/500ML-% IV SOLN
INTRAVENOUS | Status: AC
  Filled 2021-01-29: qty 1000

## 2021-01-29 MED ORDER — LIDOCAINE HCL (PF) 1 % IJ SOLN
INTRAMUSCULAR | Status: AC
  Filled 2021-01-29: qty 30

## 2021-01-29 MED ORDER — FENTANYL CITRATE (PF) 100 MCG/2ML IJ SOLN
INTRAMUSCULAR | Status: AC
  Filled 2021-01-29: qty 2

## 2021-01-29 MED ORDER — LIDOCAINE HCL (PF) 1 % IJ SOLN
INTRAMUSCULAR | Status: DC | PRN
  Administered 2021-01-29: 2 mL via INTRADERMAL

## 2021-01-29 MED ORDER — ISOSORBIDE MONONITRATE ER 30 MG PO TB24
30.0000 mg | ORAL_TABLET | Freq: Every day | ORAL | Status: DC
  Administered 2021-01-29 – 2021-01-30 (×2): 30 mg via ORAL
  Filled 2021-01-29 (×2): qty 1

## 2021-01-29 SURGICAL SUPPLY — 12 items
CATH EXPO 5F MPA-1 (CATHETERS) ×1 IMPLANT
CATH INFINITI 5 FR AR1 MOD (CATHETERS) ×1 IMPLANT
CATH INFINITI 5FR AL1 (CATHETERS) ×1 IMPLANT
CATH INFINITI 5FR MULTPACK ANG (CATHETERS) ×1 IMPLANT
DEVICE RAD COMP TR BAND LRG (VASCULAR PRODUCTS) ×1 IMPLANT
GLIDESHEATH SLEND SS 6F .021 (SHEATH) ×1 IMPLANT
GUIDEWIRE INQWIRE 1.5J.035X260 (WIRE) IMPLANT
INQWIRE 1.5J .035X260CM (WIRE) ×2
KIT HEART LEFT (KITS) ×2 IMPLANT
PACK CARDIAC CATHETERIZATION (CUSTOM PROCEDURE TRAY) ×2 IMPLANT
TRANSDUCER W/STOPCOCK (MISCELLANEOUS) ×2 IMPLANT
TUBING CIL FLEX 10 FLL-RA (TUBING) ×2 IMPLANT

## 2021-01-29 NOTE — H&P (View-Only) (Signed)
Progress Note  Patient Name: Spencer [name] Date of Encounter: 01/29/2021  [facility] Cardiologist: Kathlyn Sacramento, MD   Subjective   Denies angina. Lying flat in bed.  Inpatient Medications    Scheduled Meds:  vitamin C  1,000 mg Oral Daily   aspirin EC  81 mg Oral Daily   carvedilol  6.25 mg Oral BID   cholecalciferol  1,000 Units Oral Daily   docusate sodium  100 mg Oral BID   empagliflozin  25 mg Oral Daily   famotidine  20 mg Oral BID   icosapent Ethyl  2 g Oral BID   insulin aspart  0-15 Units Subcutaneous TID WC   insulin aspart  0-5 Units Subcutaneous QHS   insulin glargine-yfgn  15 Units Subcutaneous Daily   lisinopril  40 mg Oral Daily   methocarbamol  500 mg Oral Q8H   [START ON 01/30/2021] prasugrel  10 mg Oral Daily   pregabalin  150 mg Oral BID   sertraline  50 mg Oral BID   sodium chloride flush  3 mL Intravenous Q12H   zolpidem  5 mg Oral QHS   Continuous Infusions:  sodium chloride 124 mL/hr at 01/29/21 0815   sodium chloride     sodium chloride 1 mL/kg/hr (01/29/21 0513)   heparin 1,850 Units/hr (01/29/21 0815)   nitroGLYCERIN 5 mcg/min (01/29/21 0815)   PRN Meds: sodium chloride, acetaminophen, nitroGLYCERIN, ondansetron (ZOFRAN) IV, polyethylene glycol, senna-docusate, sodium chloride flush, traMADol   Vital Signs    Vitals:   01/28/21 1041 01/28/21 1948 01/29/21 0553 01/29/21 0810  BP: (!) 156/84 (!) 176/70 (!) 160/75 (!) 162/73  Pulse:  64 68 62  Resp:  18 18 18   Temp:  98.7 F (37.1 C) (!) 97.5 F (36.4 C) 97.7 F (36.5 C)  TempSrc:  Oral Oral Oral  SpO2:  98% 100% 99%  Weight:      Height:        Intake/Output Summary (Last 24 hours) at 01/29/2021 0839 Last data filed at 01/29/2021 0815 Gross per 24 hour  Intake 2283 ml  Output 1700 ml  Net 583 ml   Last 3 Weights 01/27/2021 10/17/2020 04/04/2020  Weight (lbs) 274 lb 4.8 oz 280 lb 3.2 oz 277 lb 9.6 oz  Weight (kg) 124.422 kg 127.098 kg 125.919 kg      Telemetry     NSR - Personally Reviewed  ECG    NSR with RBBB - Personally Reviewed  Physical Exam  Comfortable. GEN: No acute distress.   Neck: No JVD Cardiac: Prior CABG. RRR, with 2/6 systolic murmurs but no rubs, or gallops.  Respiratory: Clear to auscultation bilaterally. GI: Soft, nontender, non-distended  MS: No edema; No deformity. Neuro:  Nonfocal  Psych: Normal affect   Labs    High Sensitivity Troponin:   Recent Labs  Lab 01/27/21 1035 01/27/21 1235 01/27/21 1813  TROPONINIHS 80* 74* 68*     Chemistry Recent Labs  Lab 01/27/21 1035 01/27/21 1400 01/28/21 0323 01/29/21 0412  NA 136  --  133* 133*  K 4.9  --  3.8 3.8  CL 103  --  101 104  CO2 23  --  21* 20*  GLUCOSE 169*  --  114* 115*  BUN 24*  --  20 21  CREATININE 0.88  --  0.87 0.87  CALCIUM 9.0  --  8.6* 8.5*  MG  --  2.0  --   --   PROT  --  7.3  --   --  ALBUMIN  --  3.6  --   --   AST  --  26  --   --   ALT  --  22  --   --   ALKPHOS  --  62  --   --   BILITOT  --  0.7  --   --   GFRNONAA >60  --  >60 >60  ANIONGAP 10  --  11 9    Lipids  Recent Labs  Lab 01/28/21 0323  CHOL 106  TRIG 198*  HDL 34*  LDLCALC 32  CHOLHDL 3.1    Hematology Recent Labs  Lab 01/27/21 1035 01/28/21 0323 01/29/21 0412  WBC 6.3 6.5 6.9  RBC 5.01 4.84 4.95  HGB 14.5 14.5 14.4  HCT 45.6 42.5 43.7  MCV 91.0 87.8 88.3  MCH 28.9 30.0 29.1  MCHC 31.8 34.1 33.0  RDW 12.9 12.9 13.0  PLT 235 205 202   Thyroid  Recent Labs  Lab 01/27/21 1400  TSH 1.925  FREET4 0.90    BNPNo results for input(s): BNP, PROBNP in the last 168 hours.  DDimer No results for input(s): DDIMER in the last 168 hours.   Radiology    DG Chest 2 View  Result Date: 01/27/2021 CLINICAL DATA:  Chest pain EXAM: CHEST - 2 VIEW COMPARISON:  Chest x-ray 12/23/2017 FINDINGS: Heart size and mediastinal contours are within normal limits. Cardiac surgical changes and median sternotomy wires. No suspicious pulmonary opacities identified. No  pleural effusion or pneumothorax visualized. No acute osseous abnormality appreciated. IMPRESSION: No acute intrathoracic process identified. Electronically Signed   By: Ofilia Neas M.D.   On: 01/27/2021 11:11   ECHOCARDIOGRAM COMPLETE  Result Date: 01/28/2021    ECHOCARDIOGRAM REPORT   Patient Name:   Spencer [name] Date of Exam: 01/28/2021 Medical Rec #:  [id number]      Height:       73.0 in Accession #:    [id number]     Weight:       274.3 lb Date of Birth:  1956-02-13      BSA:          2.461 m Patient Age:    65 years       BP:           151/71 mmHg Patient Gender: M              HR:           70 bpm. Exam Location:  Inpatient Procedure: 2D Echo, Cardiac Doppler, Color Doppler and Intracardiac            Opacification Agent Indications:    Chest Pain  History:        Patient has prior history of Echocardiogram examinations, most                 recent 07/23/2013. CAD, Prior CABG, Signs/Symptoms:Shortness of                 Breath; Risk Factors:Hypertension, Diabetes and Dyslipidemia.  Sonographer:    Wenda Low Referring Phys: [place] Comments: Patient is morbidly obese. IMPRESSIONS  1. Left ventricular ejection fraction, by estimation, is 55 to 60%. The left ventricle has normal function. The left ventricle has no regional wall motion abnormalities. Left ventricular diastolic parameters are consistent with Grade II diastolic dysfunction (pseudonormalization). Elevated left ventricular end-diastolic pressure. The E/e' is 16.4.  2. Right ventricular systolic function is normal. The right ventricular  size is normal. Tricuspid regurgitation signal is inadequate for assessing PA pressure.  3. The mitral valve is grossly normal. Trivial mitral valve regurgitation. No evidence of mitral stenosis.  4. The aortic valve is tricuspid. There is moderate calcification of the aortic valve. There is moderate thickening of the aortic valve. Aortic valve regurgitation is not visualized.  Mild aortic valve stenosis. Aortic valve area, by VTI measures 1.88 cm. Aortic valve mean gradient measures 6.0 mmHg. Aortic valve Vmax measures 1.80 m/s.  5. There is mild dilatation of the ascending aorta, measuring 40 mm.  6. The inferior vena cava is normal in size with greater than 50% respiratory variability, suggesting right atrial pressure of 3 mmHg. FINDINGS  Left Ventricle: Left ventricular ejection fraction, by estimation, is 55 to 60%. The left ventricle has normal function. The left ventricle has no regional wall motion abnormalities. Definity contrast agent was given IV to delineate the left ventricular  endocardial borders. The left ventricular internal cavity size was normal in size. There is no left ventricular hypertrophy. Abnormal (paradoxical) septal motion consistent with post-operative status. Left ventricular diastolic parameters are consistent  with Grade II diastolic dysfunction (pseudonormalization). Elevated left ventricular end-diastolic pressure. The E/e' is 16.4. Right Ventricle: The right ventricular size is normal. No increase in right ventricular wall thickness. Right ventricular systolic function is normal. Tricuspid regurgitation signal is inadequate for assessing PA pressure. Left Atrium: Left atrial size was normal in size. Right Atrium: Right atrial size was normal in size. Pericardium: There is no evidence of pericardial effusion. Mitral Valve: The mitral valve is grossly normal. Mild mitral annular calcification. Trivial mitral valve regurgitation. No evidence of mitral valve stenosis. Tricuspid Valve: The tricuspid valve is grossly normal. Tricuspid valve regurgitation is trivial. No evidence of tricuspid stenosis. Aortic Valve: The aortic valve is tricuspid. There is moderate calcification of the aortic valve. There is moderate thickening of the aortic valve. Aortic valve regurgitation is not visualized. Mild aortic stenosis is present. Aortic valve mean gradient measures  6.0 mmHg. Aortic valve peak gradient measures 13.0 mmHg. Aortic valve area, by VTI measures 1.88 cm. Pulmonic Valve: The pulmonic valve was grossly normal. Pulmonic valve regurgitation is not visualized. No evidence of pulmonic stenosis. Aorta: The aortic root is normal in size and structure. There is mild dilatation of the ascending aorta, measuring 40 mm. Venous: The inferior vena cava is normal in size with greater than 50% respiratory variability, suggesting right atrial pressure of 3 mmHg. IAS/Shunts: The atrial septum is grossly normal.  LEFT VENTRICLE PLAX 2D LVIDd:         5.40 cm   Diastology LVIDs:         3.50 cm   LV e' medial:    5.22 cm/s LV PW:         1.30 cm   LV E/e' medial:  16.4 LV IVS:        1.40 cm   LV e' lateral:   7.27 cm/s LVOT diam:     2.20 cm   LV E/e' lateral: 11.8 LV SV:         71 LV SV Index:   29 LVOT Area:     3.80 cm  LEFT ATRIUM             Index        RIGHT ATRIUM           Index LA diam:        4.60 cm 1.87 cm/m  RA Area:     18.20 cm LA Vol (A2C):   58.0 ml 23.57 ml/m  RA Volume:   45.10 ml  18.33 ml/m LA Vol (A4C):   63.9 ml 25.97 ml/m LA Biplane Vol: 61.6 ml 25.03 ml/m  AORTIC VALVE                     PULMONIC VALVE AV Area (Vmax):    1.72 cm      PV Vmax:       0.74 m/s AV Area (Vmean):   1.73 cm      PV Peak grad:  2.2 mmHg AV Area (VTI):     1.88 cm AV Vmax:           180.00 cm/s AV Vmean:          112.000 cm/s AV VTI:            0.378 m AV Peak Grad:      13.0 mmHg AV Mean Grad:      6.0 mmHg LVOT Vmax:         81.60 cm/s LVOT Vmean:        51.000 cm/s LVOT VTI:          0.187 m LVOT/AV VTI ratio: 0.49  AORTA Ao Root diam: 3.60 cm Ao Asc diam:  4.00 cm MITRAL VALVE MV Area (PHT): 3.19 cm     SHUNTS MV Decel Time: 238 msec     Systemic VTI:  0.19 m MV E velocity: 85.70 cm/s   Systemic Diam: 2.20 cm MV A velocity: 109.00 cm/s MV E/A ratio:  0.79 Eleonore Chiquito MD Electronically signed by Eleonore Chiquito MD Signature Date/Time: 01/28/2021/1:03:38 PM    Final      Cardiac Studies   Remote cath. See summary below.  Patient Profile     65 y.o. male CAD with CABG 2012 ICM EF 45%, and DES to VG to OM and VG to RCA in 2015, DM-2, carotid disease, obesity, HLD, intolerant to statins, who is being seen 01/27/2021 for the evaluation of chest pain.  Assessment & Plan    CAD with prior CABG now with symptoms of angina and mildly elevated troponin I c/w NSTEACS. Plan cath today with anatomy guided  therapy. Mild aortic stenosis. Hyperlipidemia DM II   The patient was counseled to undergo left heart catheterization, bypass and coronary angiography, and possible percutaneous coronary intervention with stent implantation. The procedural risks and benefits were discussed in detail. The risks discussed included death, stroke, myocardial infarction, life-threatening bleeding, limb ischemia, kidney injury, allergy, and possible emergency cardiac surgery. The risk of these significant complications were estimated to occur less than 1% of the time. After discussion, the patient has agreed to proceed.   For questions or updates, please contact [place] Please consult [email or web address] for contact info under        Signed, Sinclair Grooms, MD  01/29/2021, 8:39 AM

## 2021-01-29 NOTE — Interval H&P Note (Signed)
History and Physical Interval Note:  01/29/2021 9:48 AM  Spencer [name]  has presented today for surgery, with the diagnosis of chest pain.  The various methods of treatment have been discussed with the patient and family. After consideration of risks, benefits and other options for treatment, the patient has consented to  Procedure(s): LEFT HEART CATH AND CORS/GRAFTS ANGIOGRAPHY (N/A) as a surgical intervention.  The patient's history has been reviewed, patient examined, no change in status, stable for surgery.  I have reviewed the patient's chart and labs.  Questions were answered to the patient's satisfaction.    Cath Lab Visit (complete for each Cath Lab visit)  Clinical Evaluation Leading to the Procedure:   ACS: Yes.    Non-ACS:    Anginal Classification: CCS III  Anti-ischemic medical therapy: Minimal Therapy (1 class of medications)  Non-Invasive Test Results: No non-invasive testing performed  Prior CABG: Previous CABG        Lauree Chandler

## 2021-01-29 NOTE — Care Management (Signed)
  Transition of Care [facility]) Screening Note   Patient Details  Name: HOBIE KOHLES Date of Birth: 03/13/1955   [facility] of [facility] CM/SW Contact:    Bethena Roys, RN Phone Number: 01/29/2021, 12:33 PM    Transition of Care Department [facility]) has reviewed patient and no TOC needs have been identified at this time. We will continue to monitor patient advancement through interdisciplinary progression rounds. If new patient transition needs arise, please place a TOC consult.

## 2021-01-29 NOTE — Progress Notes (Signed)
ANTICOAGULATION CONSULT NOTE  Pharmacy Consult for heparin Indication: chest pain/ACS  Allergies  Allergen Reactions   Victoza [Liraglutide] Other (See Comments)    pancreatitis   Brilinta [Ticagrelor] Other (See Comments)    Dyspnea   Codeine Other (See Comments)    hyperactive   Other Nausea And Vomiting    Mayonnaise causes nausea and vomiting   Statins Other (See Comments)    Muscle weakness & fatigue    Patient Measurements: Height: 6\' 1"  (185.4 cm) Weight: 124.4 kg (274 lb 4.8 oz) IBW/kg (Calculated) : 79.9 Heparin Dosing Weight: 108 kg   Vital Signs: Temp: 98.7 F (37.1 C) (12/11 1948) Temp Source: Oral (12/11 1948) BP: 176/70 (12/11 1948) Pulse Rate: 64 (12/11 1948)  Labs: Recent Labs    01/27/21 1035 01/27/21 1235 01/27/21 1813 01/27/21 2040 01/28/21 0323 01/28/21 1022 01/28/21 1842 01/29/21 0412  HGB 14.5  --   --   --  14.5  --   --  14.4  HCT 45.6  --   --   --  42.5  --   --  43.7  PLT 235  --   --   --  205  --   --  202  HEPARINUNFRC  --   --   --    < > 0.12* 0.24* 0.29* 0.54  CREATININE 0.88  --   --   --  0.87  --   --  0.87  TROPONINIHS 80* 74* 68*  --   --   --   --   --    < > = values in this interval not displayed.     Estimated Creatinine Clearance: 120.1 mL/min (by C-G formula based on SCr of 0.87 mg/dL).   Medical History: Past Medical History:  Diagnosis Date   Anxiety attack    Arthritis    "back; shoulders; knees" (07/23/2013)   Back pain, chronic    "all over" (07/23/2013)   Benign neoplasm of colon 04/01/2000   Hyperplastic   Coronary artery disease    a. NSTEMI 10/12: s/p CABG with L-LAD, S-OM1/OM2, S-RV marg/RCA;  b. 07/2013 Cath/PCI: LM <10, LAD 70-80p, 82m, D1 90, RI 30ost, LCX 40-50p, 77m, OM1 90, OM2 100, RCA 100p, VG->OM1->OM2 patent with 90 @ anast (2.5x12 Xience Alpine DES), RCA 100p, LIMA->LAD nl, VG->Diag nl, VG->RV marginal/RCA 90p (3.0x23 Xience Alpine DES);  c. 07/2013 Echo: EF 55-60%.   Depression    "pain  related"   DM2 (diabetes mellitus, type 2) (HCC)    GERD (gastroesophageal reflux disease)    Hyperlipidemia    Hypertension    IBS (irritable bowel syndrome)    Morbidly obese (HCC)     Medications:  Medications Prior to Admission  Medication Sig Dispense Refill Last Dose   Ascorbic Acid (VITAMIN C) 1000 MG tablet Take 1,000 mg by mouth daily.   01/27/2021   aspirin EC 81 MG tablet Take 81 mg by mouth daily.   01/27/2021   azithromycin (ZITHROMAX) 250 MG tablet Take 2 tablets by mouth on the first day and then one a day until gone (Patient taking differently: Take 250-500 mg by mouth daily as needed (urinary infections).) 6 tablet 0 More than a month ago   carvedilol (COREG) 6.25 MG tablet TAKE 1 TABLET BY MOUTH TWICE DAILY (Patient taking differently: Take 6.25 mg by mouth 2 (two) times daily.) 180 tablet 2 01/27/2021 at 0600   cholecalciferol (VITAMIN D) 1000 UNITS tablet Take 1,000 Units by mouth daily.  01/27/2021   ciprofloxacin (CIPRO) 500 MG tablet Take 1 tablet (500 mg total) by mouth every 12 (twelve) hours. (Patient taking differently: Take 500 mg by mouth every 12 (twelve) hours as needed (urinary infections).) 14 tablet 0 More than a month ago   docusate sodium (COLACE) 100 MG capsule Take 100 mg by mouth 2 (two) times daily.   01/27/2021   empagliflozin (JARDIANCE) 25 MG TABS tablet Take 1 tablet by mouth once a day (Patient taking differently: Take 25 mg by mouth daily.) 90 tablet 3 01/27/2021   Evolocumab (REPATHA SURECLICK) 867 MG/ML SOAJ Inject 140mg  every 2 weeks Subcutaneous 30 days (Patient taking differently: Inject 140 mg into the skin every 14 (fourteen) days.) 4 mL 11 01/18/2021   famotidine (PEPCID) 20 MG tablet TAKE 2 TABLETS BY MOUTH TWO TIMES DAILY (Patient taking differently: Take 40 mg by mouth 2 (two) times daily.) 360 tablet 3 01/27/2021   HUMALOG KWIKPEN 100 UNIT/ML KwikPen Inject 4 Units into the skin 3 (three) times daily as needed (high blood sugar level).  With meals as directed   01/26/2021   icosapent Ethyl (VASCEPA) 1 g capsule Take 2 capsules by mouth with meals twice a day (Patient taking differently: Take 2 g by mouth in the morning and at bedtime.) 360 capsule 3 01/27/2021   insulin glargine, 2 Unit Dial, (TOUJEO MAX SOLOSTAR) 300 UNIT/ML Solostar Pen Inject 32 units into the skin daily and increase as directed in clinic up to 100 units. (Patient taking differently: Inject 32 Units into the skin daily.) 12 mL 11 01/27/2021   lisinopril (PRINIVIL,ZESTRIL) 40 MG tablet Take 1 tablet (40 mg total) by mouth daily. 30 tablet 6 01/27/2021   metFORMIN (GLUCOPHAGE-XR) 500 MG 24 hr tablet Take 1 tablet by mouth twice a day (Patient taking differently: Take 500 mg by mouth in the morning and at bedtime.) 180 tablet 3 01/27/2021   methocarbamol (ROBAXIN) 500 MG tablet TAKE 1 TABLET BY MOUTH 3 TIMES DAILY AS NEEDED (Patient taking differently: Take 500 mg by mouth 3 (three) times daily.) 270 tablet 3 01/27/2021   metroNIDAZOLE (METROGEL) 1 % gel APPLY TO AFFECTED AREAS EXTERNALLY DAILY (Patient taking differently: Apply 1 application topically daily as needed (rosacea flares).) 60 g 6 Past Month   nitroGLYCERIN (NITROSTAT) 0.4 MG SL tablet Place 1 tablet (0.4 mg total) under the tongue every 5 (five) minutes as needed for chest pain (CP or SOB). 25 tablet 3 More than a month ago   polyethylene glycol powder (GLYCOLAX/MIRALAX) 17 GM/SCOOP powder Take 17 g by mouth daily as needed (constipation).   Past Month   prasugrel (EFFIENT) 10 MG TABS tablet Take 1 tablet (10 mg total) by mouth daily. 90 tablet 3 01/27/2021   pregabalin (LYRICA) 150 MG capsule Take 1 capsule by mouth twice a day (Patient taking differently: Take 150 mg by mouth 2 (two) times daily.) 60 capsule 0 01/27/2021   Probiotic Product (PROBIOTIC PO) Take 1 capsule by mouth daily. Keifer probiotic   01/27/2021   pyridOXINE (VITAMIN B-6) 100 MG tablet Take 100 mg by mouth daily.   01/27/2021    sertraline (ZOLOFT) 50 MG tablet TAKE 1 TABLET BY MOUTH TWICE DAILY 180 tablet 2 01/27/2021   traMADol (ULTRAM) 50 MG tablet Take 1-2 tablets (50-100 mg total) by mouth every 6 (six) hours as needed for pain. (Patient taking differently: Take 50-100 mg by mouth every 6 (six) hours.) 720 tablet 3 01/27/2021   zolpidem (AMBIEN) 10 MG tablet Take 1 tablet  by mouth at bedtime as needed for insomnia (Patient taking differently: Take 10 mg by mouth at bedtime.) 30 tablet 0 01/26/2021   COVID-19 mRNA vaccine, Moderna, 100 MCG/0.5ML injection INJECT AS DIRECTED .25 mL 0    COVID-19 mRNA vaccine, Moderna, 100 MCG/0.5ML injection Inject into the muscle. 0.25 mL 0    Evolocumab 140 MG/ML SOAJ INJECT 140 MG INTO THE SKIN EVERY 14 DAYS. (Patient not taking: Reported on 01/28/2021) 2 mL 11 Not Taking   FREESTYLE LITE test strip       influenza vac split quadrivalent PF (FLUARIX) 0.5 ML injection Inject into the muscle. (Patient not taking: Reported on 01/28/2021) 0.5 mL 0 Not Taking   insulin lispro (HUMALOG KWIKPEN) 100 UNIT/ML KwikPen Inject 4 units Subcutaneous with meals (Patient not taking: Reported on 01/28/2021) 12 mL 3 Not Taking   Insulin Pen Needle 32G X 6 MM MISC Use with Victoza daily. 100 each 3    lisinopril (ZESTRIL) 40 MG tablet 1 tablet Orally Once a day 90 days (Patient not taking: Reported on 01/28/2021) 90 tablet 3 Not Taking   oxycodone (OXY-IR) 5 MG capsule Take 1 capsule (5 mg total) by mouth every 4 (four) hours as needed. (Patient not taking: Reported on 01/28/2021) 15 capsule 0 Not Taking   PENTIPS 31G X 5 MM MISC       pregabalin (LYRICA) 150 MG capsule Take 1 capsule by mouth twice a day (Patient not taking: Reported on 01/28/2021) 60 capsule 0 Not Taking   pregabalin (LYRICA) 150 MG capsule Take 1 capsule (150 mg total) by mouth 2 (two) times daily. (Patient not taking: Reported on 01/28/2021) 180 capsule 3 Not Taking   senna-docusate (SENOKOT-S) 8.6-50 MG tablet Take 1 tablet by mouth  at bedtime as needed for mild constipation. (Patient not taking: Reported on 01/28/2021) 30 tablet 0 Not Taking   zolpidem (AMBIEN) 10 MG tablet Take 1 tablet (10 mg total) by mouth at bedtime as needed for sleep. (Patient not taking: Reported on 01/28/2021) 90 tablet 3 Not Taking    Assessment: 37 YOM with h/o CABG who presents with chest pain. Pharmacy consulted to dose IV heparin for ACS. Plans noted for cardiac cath on Monday.  Heparin level 0.54 and therapeutic on 1850 units/h.   Goal of Therapy:  Heparin level 0.3-0.7 units/ml Monitor platelets by anticoagulation protocol: Yes   Plan:  Continue heparin at 1850 units/h Will follow plans post cath  Hildred Laser, PharmD Clinical Pharmacist **Pharmacist phone directory can now be found on [email or web address] ([email or web address]).  Listed under [place].

## 2021-01-29 NOTE — Progress Notes (Signed)
Progress Note  Patient Name: Spencer [name] Date of Encounter: 01/29/2021  [facility] Cardiologist: Kathlyn Sacramento, MD   Subjective   Denies angina. Lying flat in bed.  Inpatient Medications    Scheduled Meds:  vitamin C  1,000 mg Oral Daily   aspirin EC  81 mg Oral Daily   carvedilol  6.25 mg Oral BID   cholecalciferol  1,000 Units Oral Daily   docusate sodium  100 mg Oral BID   empagliflozin  25 mg Oral Daily   famotidine  20 mg Oral BID   icosapent Ethyl  2 g Oral BID   insulin aspart  0-15 Units Subcutaneous TID WC   insulin aspart  0-5 Units Subcutaneous QHS   insulin glargine-yfgn  15 Units Subcutaneous Daily   lisinopril  40 mg Oral Daily   methocarbamol  500 mg Oral Q8H   [START ON 01/30/2021] prasugrel  10 mg Oral Daily   pregabalin  150 mg Oral BID   sertraline  50 mg Oral BID   sodium chloride flush  3 mL Intravenous Q12H   zolpidem  5 mg Oral QHS   Continuous Infusions:  sodium chloride 124 mL/hr at 01/29/21 0815   sodium chloride     sodium chloride 1 mL/kg/hr (01/29/21 0513)   heparin 1,850 Units/hr (01/29/21 0815)   nitroGLYCERIN 5 mcg/min (01/29/21 0815)   PRN Meds: sodium chloride, acetaminophen, nitroGLYCERIN, ondansetron (ZOFRAN) IV, polyethylene glycol, senna-docusate, sodium chloride flush, traMADol   Vital Signs    Vitals:   01/28/21 1041 01/28/21 1948 01/29/21 0553 01/29/21 0810  BP: (!) 156/84 (!) 176/70 (!) 160/75 (!) 162/73  Pulse:  64 68 62  Resp:  18 18 18   Temp:  98.7 F (37.1 C) (!) 97.5 F (36.4 C) 97.7 F (36.5 C)  TempSrc:  Oral Oral Oral  SpO2:  98% 100% 99%  Weight:      Height:        Intake/Output Summary (Last 24 hours) at 01/29/2021 0839 Last data filed at 01/29/2021 0815 Gross per 24 hour  Intake 2283 ml  Output 1700 ml  Net 583 ml   Last 3 Weights 01/27/2021 10/17/2020 04/04/2020  Weight (lbs) 274 lb 4.8 oz 280 lb 3.2 oz 277 lb 9.6 oz  Weight (kg) 124.422 kg 127.098 kg 125.919 kg      Telemetry     NSR - Personally Reviewed  ECG    NSR with RBBB - Personally Reviewed  Physical Exam  Comfortable. GEN: No acute distress.   Neck: No JVD Cardiac: Prior CABG. RRR, with 2/6 systolic murmurs but no rubs, or gallops.  Respiratory: Clear to auscultation bilaterally. GI: Soft, nontender, non-distended  MS: No edema; No deformity. Neuro:  Nonfocal  Psych: Normal affect   Labs    High Sensitivity Troponin:   Recent Labs  Lab 01/27/21 1035 01/27/21 1235 01/27/21 1813  TROPONINIHS 80* 74* 68*     Chemistry Recent Labs  Lab 01/27/21 1035 01/27/21 1400 01/28/21 0323 01/29/21 0412  NA 136  --  133* 133*  K 4.9  --  3.8 3.8  CL 103  --  101 104  CO2 23  --  21* 20*  GLUCOSE 169*  --  114* 115*  BUN 24*  --  20 21  CREATININE 0.88  --  0.87 0.87  CALCIUM 9.0  --  8.6* 8.5*  MG  --  2.0  --   --   PROT  --  7.3  --   --  ALBUMIN  --  3.6  --   --   AST  --  26  --   --   ALT  --  22  --   --   ALKPHOS  --  62  --   --   BILITOT  --  0.7  --   --   GFRNONAA >60  --  >60 >60  ANIONGAP 10  --  11 9    Lipids  Recent Labs  Lab 01/28/21 0323  CHOL 106  TRIG 198*  HDL 34*  LDLCALC 32  CHOLHDL 3.1    Hematology Recent Labs  Lab 01/27/21 1035 01/28/21 0323 01/29/21 0412  WBC 6.3 6.5 6.9  RBC 5.01 4.84 4.95  HGB 14.5 14.5 14.4  HCT 45.6 42.5 43.7  MCV 91.0 87.8 88.3  MCH 28.9 30.0 29.1  MCHC 31.8 34.1 33.0  RDW 12.9 12.9 13.0  PLT 235 205 202   Thyroid  Recent Labs  Lab 01/27/21 1400  TSH 1.925  FREET4 0.90    BNPNo results for input(s): BNP, PROBNP in the last 168 hours.  DDimer No results for input(s): DDIMER in the last 168 hours.   Radiology    DG Chest 2 View  Result Date: 01/27/2021 CLINICAL DATA:  Chest pain EXAM: CHEST - 2 VIEW COMPARISON:  Chest x-ray 12/23/2017 FINDINGS: Heart size and mediastinal contours are within normal limits. Cardiac surgical changes and median sternotomy wires. No suspicious pulmonary opacities identified. No  pleural effusion or pneumothorax visualized. No acute osseous abnormality appreciated. IMPRESSION: No acute intrathoracic process identified. Electronically Signed   By: Ofilia Neas M.D.   On: 01/27/2021 11:11   ECHOCARDIOGRAM COMPLETE  Result Date: 01/28/2021    ECHOCARDIOGRAM REPORT   Patient Name:   Spencer [name] Date of Exam: 01/28/2021 Medical Rec #:  [id number]      Height:       73.0 in Accession #:    [id number]     Weight:       274.3 lb Date of Birth:  [birth year]      BSA:          2.461 m Patient Age:    65 years       BP:           151/71 mmHg Patient Gender: M              HR:           70 bpm. Exam Location:  Inpatient Procedure: 2D Echo, Cardiac Doppler, Color Doppler and Intracardiac            Opacification Agent Indications:    Chest Pain  History:        Patient has prior history of Echocardiogram examinations, most                 recent 07/23/2013. CAD, Prior CABG, Signs/Symptoms:Shortness of                 Breath; Risk Factors:Hypertension, Diabetes and Dyslipidemia.  Sonographer:    Wenda Low Referring Phys: [place] Comments: Patient is morbidly obese. IMPRESSIONS  1. Left ventricular ejection fraction, by estimation, is 55 to 60%. The left ventricle has normal function. The left ventricle has no regional wall motion abnormalities. Left ventricular diastolic parameters are consistent with Grade II diastolic dysfunction (pseudonormalization). Elevated left ventricular end-diastolic pressure. The E/e' is 16.4.  2. Right ventricular systolic function is normal. The right ventricular  size is normal. Tricuspid regurgitation signal is inadequate for assessing PA pressure.  3. The mitral valve is grossly normal. Trivial mitral valve regurgitation. No evidence of mitral stenosis.  4. The aortic valve is tricuspid. There is moderate calcification of the aortic valve. There is moderate thickening of the aortic valve. Aortic valve regurgitation is not visualized.  Mild aortic valve stenosis. Aortic valve area, by VTI measures 1.88 cm. Aortic valve mean gradient measures 6.0 mmHg. Aortic valve Vmax measures 1.80 m/s.  5. There is mild dilatation of the ascending aorta, measuring 40 mm.  6. The inferior vena cava is normal in size with greater than 50% respiratory variability, suggesting right atrial pressure of 3 mmHg. FINDINGS  Left Ventricle: Left ventricular ejection fraction, by estimation, is 55 to 60%. The left ventricle has normal function. The left ventricle has no regional wall motion abnormalities. Definity contrast agent was given IV to delineate the left ventricular  endocardial borders. The left ventricular internal cavity size was normal in size. There is no left ventricular hypertrophy. Abnormal (paradoxical) septal motion consistent with post-operative status. Left ventricular diastolic parameters are consistent  with Grade II diastolic dysfunction (pseudonormalization). Elevated left ventricular end-diastolic pressure. The E/e' is 16.4. Right Ventricle: The right ventricular size is normal. No increase in right ventricular wall thickness. Right ventricular systolic function is normal. Tricuspid regurgitation signal is inadequate for assessing PA pressure. Left Atrium: Left atrial size was normal in size. Right Atrium: Right atrial size was normal in size. Pericardium: There is no evidence of pericardial effusion. Mitral Valve: The mitral valve is grossly normal. Mild mitral annular calcification. Trivial mitral valve regurgitation. No evidence of mitral valve stenosis. Tricuspid Valve: The tricuspid valve is grossly normal. Tricuspid valve regurgitation is trivial. No evidence of tricuspid stenosis. Aortic Valve: The aortic valve is tricuspid. There is moderate calcification of the aortic valve. There is moderate thickening of the aortic valve. Aortic valve regurgitation is not visualized. Mild aortic stenosis is present. Aortic valve mean gradient measures  6.0 mmHg. Aortic valve peak gradient measures 13.0 mmHg. Aortic valve area, by VTI measures 1.88 cm. Pulmonic Valve: The pulmonic valve was grossly normal. Pulmonic valve regurgitation is not visualized. No evidence of pulmonic stenosis. Aorta: The aortic root is normal in size and structure. There is mild dilatation of the ascending aorta, measuring 40 mm. Venous: The inferior vena cava is normal in size with greater than 50% respiratory variability, suggesting right atrial pressure of 3 mmHg. IAS/Shunts: The atrial septum is grossly normal.  LEFT VENTRICLE PLAX 2D LVIDd:         5.40 cm   Diastology LVIDs:         3.50 cm   LV e' medial:    5.22 cm/s LV PW:         1.30 cm   LV E/e' medial:  16.4 LV IVS:        1.40 cm   LV e' lateral:   7.27 cm/s LVOT diam:     2.20 cm   LV E/e' lateral: 11.8 LV SV:         71 LV SV Index:   29 LVOT Area:     3.80 cm  LEFT ATRIUM             Index        RIGHT ATRIUM           Index LA diam:        4.60 cm 1.87 cm/m  RA Area:     18.20 cm LA Vol (A2C):   58.0 ml 23.57 ml/m  RA Volume:   45.10 ml  18.33 ml/m LA Vol (A4C):   63.9 ml 25.97 ml/m LA Biplane Vol: 61.6 ml 25.03 ml/m  AORTIC VALVE                     PULMONIC VALVE AV Area (Vmax):    1.72 cm      PV Vmax:       0.74 m/s AV Area (Vmean):   1.73 cm      PV Peak grad:  2.2 mmHg AV Area (VTI):     1.88 cm AV Vmax:           180.00 cm/s AV Vmean:          112.000 cm/s AV VTI:            0.378 m AV Peak Grad:      13.0 mmHg AV Mean Grad:      6.0 mmHg LVOT Vmax:         81.60 cm/s LVOT Vmean:        51.000 cm/s LVOT VTI:          0.187 m LVOT/AV VTI ratio: 0.49  AORTA Ao Root diam: 3.60 cm Ao Asc diam:  4.00 cm MITRAL VALVE MV Area (PHT): 3.19 cm     SHUNTS MV Decel Time: 238 msec     Systemic VTI:  0.19 m MV E velocity: 85.70 cm/s   Systemic Diam: 2.20 cm MV A velocity: 109.00 cm/s MV E/A ratio:  0.79 Eleonore Chiquito MD Electronically signed by Eleonore Chiquito MD Signature Date/Time: 01/28/2021/1:03:38 PM    Final      Cardiac Studies   Remote cath. See summary below.  Patient Profile     65 y.o. male CAD with CABG 2012 ICM EF 45%, and DES to VG to OM and VG to RCA in 2015, DM-2, carotid disease, obesity, HLD, intolerant to statins, who is being seen 01/27/2021 for the evaluation of chest pain.  Assessment & Plan    CAD with prior CABG now with symptoms of angina and mildly elevated troponin I c/w NSTEACS. Plan cath today with anatomy guided  therapy. Mild aortic stenosis. Hyperlipidemia DM II   The patient was counseled to undergo left heart catheterization, bypass and coronary angiography, and possible percutaneous coronary intervention with stent implantation. The procedural risks and benefits were discussed in detail. The risks discussed included death, stroke, myocardial infarction, life-threatening bleeding, limb ischemia, kidney injury, allergy, and possible emergency cardiac surgery. The risk of these significant complications were estimated to occur less than 1% of the time. After discussion, the patient has agreed to proceed.   For questions or updates, please contact [place] Please consult [email or web address] for contact info under        Signed, Sinclair Grooms, MD  01/29/2021, 8:39 AM

## 2021-01-30 ENCOUNTER — Other Ambulatory Visit (HOSPITAL_BASED_OUTPATIENT_CLINIC_OR_DEPARTMENT_OTHER): Payer: Self-pay

## 2021-01-30 ENCOUNTER — Other Ambulatory Visit (HOSPITAL_COMMUNITY): Payer: Self-pay

## 2021-01-30 LAB — CBC
HCT: 41.2 % (ref 39.0–52.0)
Hemoglobin: 13.8 g/dL (ref 13.0–17.0)
MCH: 29.9 pg (ref 26.0–34.0)
MCHC: 33.5 g/dL (ref 30.0–36.0)
MCV: 89.2 fL (ref 80.0–100.0)
Platelets: 219 10*3/uL (ref 150–400)
RBC: 4.62 MIL/uL (ref 4.22–5.81)
RDW: 13.2 % (ref 11.5–15.5)
WBC: 7.4 10*3/uL (ref 4.0–10.5)
nRBC: 0 % (ref 0.0–0.2)

## 2021-01-30 LAB — BASIC METABOLIC PANEL
Anion gap: 11 (ref 5–15)
BUN: 25 mg/dL — ABNORMAL HIGH (ref 8–23)
CO2: 19 mmol/L — ABNORMAL LOW (ref 22–32)
Calcium: 8.5 mg/dL — ABNORMAL LOW (ref 8.9–10.3)
Chloride: 105 mmol/L (ref 98–111)
Creatinine, Ser: 1.06 mg/dL (ref 0.61–1.24)
GFR, Estimated: >60 mL/min (ref >60)
Glucose, Bld: 124 mg/dL — ABNORMAL HIGH (ref 70–99)
Potassium: 4.1 mmol/L (ref 3.5–5.1)
Sodium: 135 mmol/L (ref 135–145)

## 2021-01-30 LAB — GLUCOSE, CAPILLARY
Glucose-Capillary: 194 mg/dL — ABNORMAL HIGH (ref 70–99)
Glucose-Capillary: 132 mg/dL — ABNORMAL HIGH (ref 70–99)

## 2021-01-30 MED ORDER — ISOSORBIDE MONONITRATE ER 30 MG PO TB24
30.0000 mg | ORAL_TABLET | Freq: Every day | ORAL | 1 refills | Status: AC
  Filled 2021-01-30: qty 90, 90d supply, fill #0
  Filled 2021-04-17 – 2021-04-19 (×2): qty 90, 90d supply, fill #1

## 2021-01-30 NOTE — Progress Notes (Signed)
Mobility Specialist Progress Note    01/30/21 0952  Mobility  Activity Ambulated in hall  Level of Assistance Standby assist, set-up cues, supervision of patient - no hands on  Assistive Device None  Distance Ambulated (ft) 400 ft  Mobility Ambulated independently in hallway  Mobility Response Tolerated well  Mobility performed by Mobility specialist  Bed Position Chair  $Mobility charge 1 Mobility   Pt received in bed and agreeable. No complaints on walk. Returned to bed with call bell in reach.   [facility] Mobility Specialist  M.S. Primary Phone: [phone] M.S. Secondary Phone: [phone]

## 2021-01-30 NOTE — Discharge Summary (Addendum)
The patient has been seen in conjunction with Harlan Stains, NP. All aspects of care have been considered and discussed. The patient has been personally interviewed, examined, and all clinical data has been reviewed.  Chronic coronary disease with both bypass graft and native vessel occlusive disease. Also has chronic diastolic heart failure. Anti-ischemic protection increased with up titration of isosorbide. Plan discharge today with further medication adjustments as outpatient including maximal risk reduction aiming for LDL less than 55.  Discharge Summary    Patient ID: Spencer [name] MRN: [id number]; DOB: 12/20/1955  Admit date: 01/27/2021 Discharge date: 01/30/2021  PCP:  Burnard Bunting, MD   [facility] Providers Cardiologist:  Kathlyn Sacramento, MD     Discharge Diagnoses    Principal Problem:   Unstable angina [facility]) Active Problems:   Hypertension associated with diabetes ([place])   Hyperlipidemia   DM2 (diabetes mellitus, type 2) ([place])  Diagnostic Studies/Procedures    Cath: 01/29/21    Prox RCA lesion is 100% stenosed.   Dist RCA lesion is 100% stenosed.   Origin to Prox Graft lesion is 100% stenosed.   1st Diag lesion is 90% stenosed.   Prox Graft lesion is 100% stenosed.   Mid LAD lesion is 100% stenosed.   2nd Mrg lesion is 100% stenosed.   3rd Mrg lesion is 100% stenosed.   Ost Cx to Prox Cx lesion is 90% stenosed.   Mid Cx to Dist Cx lesion is 90% stenosed.   Previously placed Mid Graft to Insertion stent (unknown type)  between 2nd Mrg and 3rd Mrg is  widely patent.   SVG graft was visualized by angiography and is normal in caliber.   SVG graft was visualized by angiography.   SVG graft was visualized by angiography.   LIMA graft was visualized by angiography.   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  Chronic occlusion mid LAD. The free LIMA is attached to the vein graft pedicle of the SVG to OM1/OM2. The Diagonal  branch has severe disease. The SVG to the Diagonal is now occluded. This is chronic 3.  Severe disease throughout the Circumflex. Both OM branches are occluded. The sequential SVG to OM1/OM2 is patent. Patent stent in the anastomosis of SVG to OM2.  4. The RCA is chronically occluded. The SVG to the PDA is now occluded within the proximal stent. The distal vessel is seen to fill from left to right collaterals.    Recommendations: Medical management of CAD. No focal targets for PCI.   Diagnostic Dominance: Right  Echo: 01/28/21  IMPRESSIONS     1. Left ventricular ejection fraction, by estimation, is 55 to 60%. The  left ventricle has normal function. The left ventricle has no regional  wall motion abnormalities. Left ventricular diastolic parameters are  consistent with Grade II diastolic  dysfunction (pseudonormalization). Elevated left ventricular end-diastolic  pressure. The E/e' is 16.4.   2. Right ventricular systolic function is normal. The right ventricular  size is normal. Tricuspid regurgitation signal is inadequate for assessing  PA pressure.   3. The mitral valve is grossly normal. Trivial mitral valve  regurgitation. No evidence of mitral stenosis.   4. The aortic valve is tricuspid. There is moderate calcification of the  aortic valve. There is moderate thickening of the aortic valve. Aortic  valve regurgitation is not visualized. Mild aortic valve stenosis. Aortic  valve area, by VTI measures 1.88  cm. Aortic valve mean gradient measures 6.0 mmHg.  Aortic valve Vmax  measures 1.80 m/s.   5. There is mild dilatation of the ascending aorta, measuring 40 mm.   6. The inferior vena cava is normal in size with greater than 50%  respiratory variability, suggesting right atrial pressure of 3 mmHg.   FINDINGS   Left Ventricle: Left ventricular ejection fraction, by estimation, is 55  to 60%. The left ventricle has normal function. The left ventricle has no  regional wall  motion abnormalities. Definity contrast agent was given IV  to delineate the left ventricular   endocardial borders. The left ventricular internal cavity size was normal  in size. There is no left ventricular hypertrophy. Abnormal (paradoxical)  septal motion consistent with post-operative status. Left ventricular  diastolic parameters are consistent   with Grade II diastolic dysfunction (pseudonormalization). Elevated left  ventricular end-diastolic pressure. The E/e' is 16.4.   Right Ventricle: The right ventricular size is normal. No increase in  right ventricular wall thickness. Right ventricular systolic function is  normal. Tricuspid regurgitation signal is inadequate for assessing PA  pressure.   Left Atrium: Left atrial size was normal in size.   Right Atrium: Right atrial size was normal in size.   Pericardium: There is no evidence of pericardial effusion.   Mitral Valve: The mitral valve is grossly normal. Mild mitral annular  calcification. Trivial mitral valve regurgitation. No evidence of mitral  valve stenosis.   Tricuspid Valve: The tricuspid valve is grossly normal. Tricuspid valve  regurgitation is trivial. No evidence of tricuspid stenosis.   Aortic Valve: The aortic valve is tricuspid. There is moderate  calcification of the aortic valve. There is moderate thickening of the  aortic valve. Aortic valve regurgitation is not visualized. Mild aortic  stenosis is present. Aortic valve mean gradient  measures 6.0 mmHg. Aortic valve peak gradient measures 13.0 mmHg. Aortic  valve area, by VTI measures 1.88 cm.   Pulmonic Valve: The pulmonic valve was grossly normal. Pulmonic valve  regurgitation is not visualized. No evidence of pulmonic stenosis.   Aorta: The aortic root is normal in size and structure. There is mild  dilatation of the ascending aorta, measuring 40 mm.   Venous: The inferior vena cava is normal in size with greater than 50%  respiratory  variability, suggesting right atrial pressure of 3 mmHg.   IAS/Shunts: The atrial septum is grossly normal.  _____________   History of Present Illness     Spencer [name] is a 65 y.o. male with CAD with CABG 2012 ICM EF 45%, and DES to VG to OM and VG to RCA in 2015, DM-2, carotid disease, obesity, HLD, intolerant to statins, who was seen 01/27/2021 for the evaluation of chest pain.  Mr. Baruch with above hx and CABG 2012 LIMA to LAD, sequential SVG to OM-1 and OM-2, seq VG to RV marginal and RCA, SVG to 1st diag.found on presentation for NSTEMI.    In 2015 with cath patent free LIMA to LAD, patent seq VG to OM1/OM2 with high grade anastomotic stenosis at insertion into OM2, patent VG to diag, patent VG to RV marginal/RCA with severe stenosis in proximal SVG.  PCI to VG to RCA with DES and to VG to OM2 with DES.    Last echo 07/2013 with EF 55-60%, mild LVH, valves normal.    He has PAD with unreliable ABIs due to calcifications.  PV angio 07/2017 with no significant aorto iliac disease.  Mild to moderate SFA and popliteal disease. One-vessel runoff below the  knee bilaterally via the peroneal artery which gave collaterals distally to the dorsalis pedis and plantar branches of the posterior tibial artery.  No revascularization was needed.  also stable carotid disease. Last dopplers in 03/2020.   Pt has been having angina in chest and in both wrists (this is his angina) since Oct. Off and on , increases with work at [employer] and with activity but no worse though he does note the wrist pain wakens him form sleep.  No SOB, no diaphoresis, no N&V, no palpitations.  Today  he told his wife about the pain and she brought to ER.  Currently with mild discomfort at rest, BP is higher as well.    He took his insulin this AM so will order diet.    Hs troponin 80 2 V CXR no acute disease   EKG:  The ECG that was done SR with RBBB and no acute changes was personally reviewed   BP 152/66 P 58 R 12 afebrile.  COVID  neg Na 136, K+ 4.9, BUN 24, Cr 0.88  Hs Troponin 80  Hgb 14.5 WBC 6.3 plts 235 2 V CXR NAD  He was admitted with plans for cardiac catheterization.   Hospital Course     Angina with hx of CAD/mildly elevated troponin: hsTn 80>>74>>68. Underwent cardiac catheterization noted above with severe 3v native CAD. 3/5 patent bypass grafts. No focal targets for PCI, recommendations for medical management. Able to ambulate without recurrent chest pain.  -- continue ASA, Effient, coreg 6.25mg  BID, jardiance, vascepa and Imdur added post cath  HLD: hx of statin intolerance, on PCSK9 -- continue vascepa   DM: continue jardiance, metformin and home insulin dose at discharge  HTN: continue coreg 6.25mg  BID, lisinopril 40mg  daily   Patient was seen by Dr. Tamala Julian and deemed stable for discharge home. Follow up in the office has been arranged. Medications sent to the [facility] pharmacy prior to discharge. Educated by PharmD.    Did the patient have an acute coronary syndrome (MI, NSTEMI, STEMI, etc) this admission?:  No.   The elevated Troponin was due to the acute medical illness (demand ischemia).   The patient will be scheduled for a TOC follow up appointment in 10-14 days.  A message has been sent to the [facility] and Scheduling Pool at the office where the patient should be seen for follow up.  _____________  Discharge Vitals Blood pressure 147/80, pulse 65, temperature 97.9 F (36.6 C), temperature source Oral, resp. rate 17, height 6\' 1"  (1.854 m), weight 124.4 kg, SpO2 99 %.  Filed Weights   01/27/21 1629  Weight: 124.4 kg    Labs & Radiologic Studies    CBC Recent Labs    01/27/21 1035 01/28/21 0323 01/29/21 0412 01/30/21 0430  WBC 6.3   < > 6.9 7.4  NEUTROABS 3.9  --   --   --   HGB 14.5   < > 14.4 13.8  HCT 45.6   < > 43.7 41.2  MCV 91.0   < > 88.3 89.2  PLT 235   < > 202 219   < > = values in this interval not displayed.   Basic Metabolic Panel Recent Labs    01/27/21 1400  01/28/21 0323 01/29/21 0412 01/30/21 0430  NA  --    < > 133* 135  K  --    < > 3.8 4.1  CL  --    < > 104 105  CO2  --    < >  20* 19*  GLUCOSE  --    < > 115* 124*  BUN  --    < > 21 25*  CREATININE  --    < > 0.87 1.06  CALCIUM  --    < > 8.5* 8.5*  MG 2.0  --   --   --    < > = values in this interval not displayed.   Liver Function Tests Recent Labs    01/27/21 1400  AST 26  ALT 22  ALKPHOS 62  BILITOT 0.7  PROT 7.3  ALBUMIN 3.6   No results for input(s): LIPASE, AMYLASE in the last 72 hours. High Sensitivity Troponin:   Recent Labs  Lab 01/27/21 1035 01/27/21 1235 01/27/21 1813  TROPONINIHS 80* 74* 68*    BNP Invalid input(s): POCBNP D-Dimer No results for input(s): DDIMER in the last 72 hours. Hemoglobin A1C Recent Labs    01/27/21 1400  HGBA1C 8.3*   Fasting Lipid Panel Recent Labs    01/28/21 0323  CHOL 106  HDL 34*  LDLCALC 32  TRIG 198*  CHOLHDL 3.1   Thyroid Function Tests Recent Labs    01/27/21 1400  TSH 1.925   _____________  DG Chest 2 View  Result Date: 01/27/2021 CLINICAL DATA:  Chest pain EXAM: CHEST - 2 VIEW COMPARISON:  Chest x-ray 12/23/2017 FINDINGS: Heart size and mediastinal contours are within normal limits. Cardiac surgical changes and median sternotomy wires. No suspicious pulmonary opacities identified. No pleural effusion or pneumothorax visualized. No acute osseous abnormality appreciated. IMPRESSION: No acute intrathoracic process identified. Electronically Signed   By: Ofilia Neas M.D.   On: 01/27/2021 11:11   CARDIAC CATHETERIZATION  Result Date: 01/29/2021   Prox RCA lesion is 100% stenosed.   Dist RCA lesion is 100% stenosed.   Origin to Prox Graft lesion is 100% stenosed.   1st Diag lesion is 90% stenosed.   Prox Graft lesion is 100% stenosed.   Mid LAD lesion is 100% stenosed.   2nd Mrg lesion is 100% stenosed.   3rd Mrg lesion is 100% stenosed.   Ost Cx to Prox Cx lesion is 90% stenosed.   Mid Cx to Dist  Cx lesion is 90% stenosed.   Previously placed Mid Graft to Insertion stent (unknown type)  between 2nd Mrg and 3rd Mrg is  widely patent.   SVG graft was visualized by angiography and is normal in caliber.   SVG graft was visualized by angiography.   SVG graft was visualized by angiography.   LIMA graft was visualized by angiography. 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  Chronic occlusion mid LAD. The free LIMA is attached to the vein graft pedicle of the SVG to OM1/OM2. The Diagonal branch has severe disease. The SVG to the Diagonal is now occluded. This is chronic 3.  Severe disease throughout the Circumflex. Both OM branches are occluded. The sequential SVG to OM1/OM2 is patent. Patent stent in the anastomosis of SVG to OM2. 4. The RCA is chronically occluded. The SVG to the PDA is now occluded within the proximal stent. The distal vessel is seen to fill from left to right collaterals. Recommendations: Medical management of CAD. No focal targets for PCI.   ECHOCARDIOGRAM COMPLETE  Result Date: 01/28/2021    ECHOCARDIOGRAM REPORT   Patient Name:   Spencer [name] Date of Exam: 01/28/2021 Medical Rec #:  [id number]      Height:  73.0 in Accession #:    [id number]     Weight:       274.3 lb Date of Birth:  1955/07/08      BSA:          2.461 m Patient Age:    10 years       BP:           151/71 mmHg Patient Gender: M              HR:           70 bpm. Exam Location:  Inpatient Procedure: 2D Echo, Cardiac Doppler, Color Doppler and Intracardiac            Opacification Agent Indications:    Chest Pain  History:        Patient has prior history of Echocardiogram examinations, most                 recent 07/23/2013. CAD, Prior CABG, Signs/Symptoms:Shortness of                 Breath; Risk Factors:Hypertension, Diabetes and Dyslipidemia.  Sonographer:    Wenda Low Referring Phys: [place] Comments: Patient is morbidly obese. IMPRESSIONS  1. Left  ventricular ejection fraction, by estimation, is 55 to 60%. The left ventricle has normal function. The left ventricle has no regional wall motion abnormalities. Left ventricular diastolic parameters are consistent with Grade II diastolic dysfunction (pseudonormalization). Elevated left ventricular end-diastolic pressure. The E/e' is 16.4.  2. Right ventricular systolic function is normal. The right ventricular size is normal. Tricuspid regurgitation signal is inadequate for assessing PA pressure.  3. The mitral valve is grossly normal. Trivial mitral valve regurgitation. No evidence of mitral stenosis.  4. The aortic valve is tricuspid. There is moderate calcification of the aortic valve. There is moderate thickening of the aortic valve. Aortic valve regurgitation is not visualized. Mild aortic valve stenosis. Aortic valve area, by VTI measures 1.88 cm. Aortic valve mean gradient measures 6.0 mmHg. Aortic valve Vmax measures 1.80 m/s.  5. There is mild dilatation of the ascending aorta, measuring 40 mm.  6. The inferior vena cava is normal in size with greater than 50% respiratory variability, suggesting right atrial pressure of 3 mmHg. FINDINGS  Left Ventricle: Left ventricular ejection fraction, by estimation, is 55 to 60%. The left ventricle has normal function. The left ventricle has no regional wall motion abnormalities. Definity contrast agent was given IV to delineate the left ventricular  endocardial borders. The left ventricular internal cavity size was normal in size. There is no left ventricular hypertrophy. Abnormal (paradoxical) septal motion consistent with post-operative status. Left ventricular diastolic parameters are consistent  with Grade II diastolic dysfunction (pseudonormalization). Elevated left ventricular end-diastolic pressure. The E/e' is 16.4. Right Ventricle: The right ventricular size is normal. No increase in right ventricular wall thickness. Right ventricular systolic function is  normal. Tricuspid regurgitation signal is inadequate for assessing PA pressure. Left Atrium: Left atrial size was normal in size. Right Atrium: Right atrial size was normal in size. Pericardium: There is no evidence of pericardial effusion. Mitral Valve: The mitral valve is grossly normal. Mild mitral annular calcification. Trivial mitral valve regurgitation. No evidence of mitral valve stenosis. Tricuspid Valve: The tricuspid valve is grossly normal. Tricuspid valve regurgitation is trivial. No evidence of tricuspid stenosis. Aortic Valve: The aortic valve is tricuspid. There is moderate calcification of the aortic valve. There is moderate thickening of the aortic  valve. Aortic valve regurgitation is not visualized. Mild aortic stenosis is present. Aortic valve mean gradient measures 6.0 mmHg. Aortic valve peak gradient measures 13.0 mmHg. Aortic valve area, by VTI measures 1.88 cm. Pulmonic Valve: The pulmonic valve was grossly normal. Pulmonic valve regurgitation is not visualized. No evidence of pulmonic stenosis. Aorta: The aortic root is normal in size and structure. There is mild dilatation of the ascending aorta, measuring 40 mm. Venous: The inferior vena cava is normal in size with greater than 50% respiratory variability, suggesting right atrial pressure of 3 mmHg. IAS/Shunts: The atrial septum is grossly normal.  LEFT VENTRICLE PLAX 2D LVIDd:         5.40 cm   Diastology LVIDs:         3.50 cm   LV e' medial:    5.22 cm/s LV PW:         1.30 cm   LV E/e' medial:  16.4 LV IVS:        1.40 cm   LV e' lateral:   7.27 cm/s LVOT diam:     2.20 cm   LV E/e' lateral: 11.8 LV SV:         71 LV SV Index:   29 LVOT Area:     3.80 cm  LEFT ATRIUM             Index        RIGHT ATRIUM           Index LA diam:        4.60 cm 1.87 cm/m   RA Area:     18.20 cm LA Vol (A2C):   58.0 ml 23.57 ml/m  RA Volume:   45.10 ml  18.33 ml/m LA Vol (A4C):   63.9 ml 25.97 ml/m LA Biplane Vol: 61.6 ml 25.03 ml/m  AORTIC VALVE                      PULMONIC VALVE AV Area (Vmax):    1.72 cm      PV Vmax:       0.74 m/s AV Area (Vmean):   1.73 cm      PV Peak grad:  2.2 mmHg AV Area (VTI):     1.88 cm AV Vmax:           180.00 cm/s AV Vmean:          112.000 cm/s AV VTI:            0.378 m AV Peak Grad:      13.0 mmHg AV Mean Grad:      6.0 mmHg LVOT Vmax:         81.60 cm/s LVOT Vmean:        51.000 cm/s LVOT VTI:          0.187 m LVOT/AV VTI ratio: 0.49  AORTA Ao Root diam: 3.60 cm Ao Asc diam:  4.00 cm MITRAL VALVE MV Area (PHT): 3.19 cm     SHUNTS MV Decel Time: 238 msec     Systemic VTI:  0.19 m MV E velocity: 85.70 cm/s   Systemic Diam: 2.20 cm MV A velocity: 109.00 cm/s MV E/A ratio:  0.79 Eleonore Chiquito MD Electronically signed by Eleonore Chiquito MD Signature Date/Time: 01/28/2021/1:03:38 PM    Final    Disposition   Pt is being discharged home today in good condition.  Follow-up Plans & Appointments     Follow-up Information  Loel Dubonnet, NP Follow up on 02/09/2021.   Specialty: Cardiology Why: at 3:10pm for your follow up appt Contact information: [place] [phone] [phone]                Discharge Instructions     Diet - low sodium heart healthy   Complete by: As directed    Discharge instructions   Complete by: As directed    Radial Site Care Refer to this sheet in the next few weeks. These instructions provide you with information on caring for yourself after your procedure. Your caregiver may also give you more specific instructions. Your treatment has been planned according to current medical practices, but problems sometimes occur. Call your caregiver if you have any problems or questions after your procedure. HOME CARE INSTRUCTIONS You may shower the day after the procedure. Remove the bandage (dressing) and gently wash the site with plain soap and water. Gently pat the site dry.  Do not apply powder or lotion to the site.  Do not submerge the affected  site in water for 3 to 5 days.  Inspect the site at least twice daily.  Do not flex or bend the affected arm for 24 hours.  No lifting over 5 pounds (2.3 kg) for 5 days after your procedure.  Do not drive home if you are discharged the same day of the procedure. Have someone else drive you.  You may drive 24 hours after the procedure unless otherwise instructed by your caregiver.  What to expect: Any bruising will usually fade within 1 to 2 weeks.  Blood that collects in the tissue (hematoma) may be painful to the touch. It should usually decrease in size and tenderness within 1 to 2 weeks.  SEEK IMMEDIATE MEDICAL CARE IF: You have unusual pain at the radial site.  You have redness, warmth, swelling, or pain at the radial site.  You have drainage (other than a small amount of blood on the dressing).  You have chills.  You have a fever or persistent symptoms for more than 72 hours.  You have a fever and your symptoms suddenly get worse.  Your arm becomes pale, cool, tingly, or numb.  You have heavy bleeding from the site. Hold pressure on the site.   Increase activity slowly   Complete by: As directed        Discharge Medications   Allergies as of 01/30/2021       Reactions   Victoza [liraglutide] Other (See Comments)   pancreatitis   Brilinta [ticagrelor] Other (See Comments)   Dyspnea   Codeine Other (See Comments)   hyperactive   Other Nausea And Vomiting   Mayonnaise causes nausea and vomiting   Statins Other (See Comments)   Muscle weakness & fatigue        Medication List     STOP taking these medications    Fluarix Quadrivalent 0.5 ML injection Generic drug: influenza vac split quadrivalent PF   Moderna COVID-19 Vaccine 100 MCG/0.5ML injection Generic drug: COVID-19 mRNA vaccine (Moderna)   oxycodone 5 MG capsule Commonly known as: OXY-IR   senna-docusate 8.6-50 MG tablet Commonly known as: Senokot-S       TAKE these medications    aspirin EC 81  MG tablet Take 81 mg by mouth daily.   carvedilol 6.25 MG tablet Commonly known as: COREG TAKE 1 TABLET BY MOUTH TWICE DAILY What changed: how much to take   cholecalciferol 1000 units tablet Commonly known  as: VITAMIN D Take 1,000 Units by mouth daily.   docusate sodium 100 MG capsule Commonly known as: COLACE Take 100 mg by mouth 2 (two) times daily.   famotidine 20 MG tablet Commonly known as: PEPCID TAKE 2 TABLETS BY MOUTH TWO TIMES DAILY What changed: how much to take   FREESTYLE LITE test strip Generic drug: glucose blood   HumaLOG KwikPen 100 UNIT/ML KwikPen Generic drug: insulin lispro Inject 4 Units into the skin 3 (three) times daily as needed (high blood sugar level). With meals as directed What changed: Another medication with the same name was removed. Continue taking this medication, and follow the directions you see here.   isosorbide mononitrate 30 MG 24 hr tablet Commonly known as: IMDUR Take 1 tablet (30 mg total) by mouth daily. Start taking on: January 31, 2021   Jardiance 25 MG Tabs tablet Generic drug: empagliflozin Take 1 tablet by mouth once a day What changed:  how much to take when to take this   lisinopril 40 MG tablet Commonly known as: ZESTRIL Take 1 tablet (40 mg total) by mouth daily. What changed: Another medication with the same name was removed. Continue taking this medication, and follow the directions you see here.   metFORMIN 500 MG 24 hr tablet Commonly known as: GLUCOPHAGE-XR Take 1 tablet by mouth twice a day What changed:  how much to take when to take this   methocarbamol 500 MG tablet Commonly known as: ROBAXIN TAKE 1 TABLET BY MOUTH 3 TIMES DAILY AS NEEDED What changed: when to take this   metroNIDAZOLE 1 % gel Commonly known as: METROGEL APPLY TO AFFECTED AREAS EXTERNALLY DAILY What changed:  how much to take how to take this when to take this reasons to take this   nitroGLYCERIN 0.4 MG SL  tablet Commonly known as: NITROSTAT Place 1 tablet (0.4 mg total) under the tongue every 5 (five) minutes as needed for chest pain (CP or SOB).   Pentips 31G X 5 MM Misc Generic drug: Insulin Pen Needle   TechLite Pen Needles 32G X 6 MM Misc Generic drug: Insulin Pen Needle Use with Victoza daily.   polyethylene glycol powder 17 GM/SCOOP powder Commonly known as: GLYCOLAX/MIRALAX Take 17 g by mouth daily as needed (constipation).   prasugrel 10 MG Tabs tablet Commonly known as: EFFIENT Take 1 tablet (10 mg total) by mouth daily.   pregabalin 150 MG capsule Commonly known as: LYRICA Take 1 capsule by mouth twice a day What changed:  how much to take how to take this when to take this   pregabalin 150 MG capsule Commonly known as: LYRICA Take 1 capsule by mouth twice a day What changed: Another medication with the same name was changed. Make sure you understand how and when to take each.   pregabalin 150 MG capsule Commonly known as: LYRICA Take 1 capsule (150 mg total) by mouth 2 (two) times daily. What changed: Another medication with the same name was changed. Make sure you understand how and when to take each.   PROBIOTIC PO Take 1 capsule by mouth daily. Keifer probiotic   pyridOXINE 100 MG tablet Commonly known as: VITAMIN B-6 Take 100 mg by mouth daily.   Repatha SureClick 154 MG/ML Soaj Generic drug: Evolocumab Inject 140mg  every 2 weeks Subcutaneous 30 days What changed:  how much to take how to take this when to take this Another medication with the same name was removed. Continue taking this medication, and follow the  directions you see here.   sertraline 50 MG tablet Commonly known as: ZOLOFT TAKE 1 TABLET BY MOUTH TWICE DAILY   Toujeo Max SoloStar 300 UNIT/ML Solostar Pen Generic drug: insulin glargine (2 Unit Dial) Inject 32 units into the skin daily and increase as directed in clinic up to 100 units. What changed:  how much to take when to  take this   traMADol 50 MG tablet Commonly known as: ULTRAM Take 1-2 tablets (50-100 mg total) by mouth every 6 (six) hours as needed for pain. What changed: when to take this   Vascepa 1 g capsule Generic drug: icosapent Ethyl Take 2 capsules by mouth with meals twice a day What changed:  how much to take when to take this   vitamin C 1000 MG tablet Take 1,000 mg by mouth daily.   zolpidem 10 MG tablet Commonly known as: AMBIEN Take 1 tablet by mouth at bedtime as needed for insomnia What changed:  how much to take how to take this when to take this Another medication with the same name was removed. Continue taking this medication, and follow the directions you see here.        Outstanding Labs/Studies   N/a   Duration of Discharge Encounter   Greater than 30 minutes including physician time.  Signed, Reino Bellis, NP 01/30/2021, 10:26 AM

## 2021-01-30 NOTE — Progress Notes (Signed)
Progress Note  Patient Name: Spencer [name] Date of Encounter: 01/30/2021  [facility] Cardiologist: Kathlyn Sacramento, MD   Subjective   Denies chest pain or dyspnea  Inpatient Medications    Scheduled Meds:  vitamin C  1,000 mg Oral Daily   aspirin EC  81 mg Oral Daily   carvedilol  6.25 mg Oral BID   cholecalciferol  1,000 Units Oral Daily   docusate sodium  100 mg Oral BID   empagliflozin  25 mg Oral Daily   famotidine  20 mg Oral BID   icosapent Ethyl  2 g Oral BID   insulin aspart  0-15 Units Subcutaneous TID WC   insulin aspart  0-5 Units Subcutaneous QHS   insulin glargine-yfgn  15 Units Subcutaneous Daily   isosorbide mononitrate  30 mg Oral Daily   lisinopril  40 mg Oral Daily   methocarbamol  500 mg Oral Q8H   prasugrel  10 mg Oral Daily   pregabalin  150 mg Oral BID   sertraline  50 mg Oral BID   sodium chloride flush  3 mL Intravenous Q12H   sodium chloride flush  3 mL Intravenous Q12H   zolpidem  5 mg Oral QHS   Continuous Infusions:  sodium chloride 124 mL/hr at 01/29/21 0815   sodium chloride     PRN Meds: sodium chloride, acetaminophen, nitroGLYCERIN, ondansetron (ZOFRAN) IV, polyethylene glycol, senna-docusate, sodium chloride flush, traMADol   Vital Signs    Vitals:   01/29/21 1501 01/29/21 1650 01/29/21 2100 01/30/21 0829  BP: 138/74 (!) 147/80  (!) 190/77  Pulse: 73 65    Resp: 20 18 17    Temp:  97.9 F (36.6 C) 97.9 F (36.6 C)   TempSrc:  Oral Oral   SpO2: 98% 99% 99%   Weight:      Height:        Intake/Output Summary (Last 24 hours) at 01/30/2021 0939 Last data filed at 01/29/2021 2104 Gross per 24 hour  Intake 1297.52 ml  Output 300 ml  Net 997.52 ml   Last 3 Weights 01/27/2021 10/17/2020 04/04/2020  Weight (lbs) 274 lb 4.8 oz 280 lb 3.2 oz 277 lb 9.6 oz  Weight (kg) 124.422 kg 127.098 kg 125.919 kg      Telemetry    Normal sinus rhythm- Personally Reviewed  ECG    Not repeated- Personally Reviewed  Physical  Exam  Stable this morning sitting, and inquiring about discharge.   GEN: No acute distress.   Neck: No JVD Cardiac: RRR, no murmurs, rubs, or gallops.  Respiratory: Clear to auscultation bilaterally. Vascular: Left radial cath site is unremarkable  Labs    High Sensitivity Troponin:   Recent Labs  Lab 01/27/21 1035 01/27/21 1235 01/27/21 1813  TROPONINIHS 80* 74* 68*     Chemistry Recent Labs  Lab 01/27/21 1400 01/28/21 0323 01/29/21 0412 01/30/21 0430  NA  --  133* 133* 135  K  --  3.8 3.8 4.1  CL  --  101 104 105  CO2  --  21* 20* 19*  GLUCOSE  --  114* 115* 124*  BUN  --  20 21 25*  CREATININE  --  0.87 0.87 1.06  CALCIUM  --  8.6* 8.5* 8.5*  MG 2.0  --   --   --   PROT 7.3  --   --   --   ALBUMIN 3.6  --   --   --   AST 26  --   --   --  ALT 22  --   --   --   ALKPHOS 62  --   --   --   BILITOT 0.7  --   --   --   GFRNONAA  --  >60 >60 >60  ANIONGAP  --  11 9 11     Lipids  Recent Labs  Lab 01/28/21 0323  CHOL 106  TRIG 198*  HDL 34*  LDLCALC 32  CHOLHDL 3.1    Hematology Recent Labs  Lab 01/28/21 0323 01/29/21 0412 01/30/21 0430  WBC 6.5 6.9 7.4  RBC 4.84 4.95 4.62  HGB 14.5 14.4 13.8  HCT 42.5 43.7 41.2  MCV 87.8 88.3 89.2  MCH 30.0 29.1 29.9  MCHC 34.1 33.0 33.5  RDW 12.9 13.0 13.2  PLT 205 202 219   Thyroid  Recent Labs  Lab 01/27/21 1400  TSH 1.925  FREET4 0.90    BNPNo results for input(s): BNP, PROBNP in the last 168 hours.  DDimer No results for input(s): DDIMER in the last 168 hours.   Radiology    CARDIAC CATHETERIZATION  Result Date: 01/29/2021   Prox RCA lesion is 100% stenosed.   Dist RCA lesion is 100% stenosed.   Origin to Prox Graft lesion is 100% stenosed.   1st Diag lesion is 90% stenosed.   Prox Graft lesion is 100% stenosed.   Mid LAD lesion is 100% stenosed.   2nd Mrg lesion is 100% stenosed.   3rd Mrg lesion is 100% stenosed.   Ost Cx to Prox Cx lesion is 90% stenosed.   Mid Cx to Dist Cx lesion is 90%  stenosed.   Previously placed Mid Graft to Insertion stent (unknown type)  between 2nd Mrg and 3rd Mrg is  widely patent.   SVG graft was visualized by angiography and is normal in caliber.   SVG graft was visualized by angiography.   SVG graft was visualized by angiography.   LIMA graft was visualized by angiography. 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  Chronic occlusion mid LAD. The free LIMA is attached to the vein graft pedicle of the SVG to OM1/OM2. The Diagonal branch has severe disease. The SVG to the Diagonal is now occluded. This is chronic 3.  Severe disease throughout the Circumflex. Both OM branches are occluded. The sequential SVG to OM1/OM2 is patent. Patent stent in the anastomosis of SVG to OM2. 4. The RCA is chronically occluded. The SVG to the PDA is now occluded within the proximal stent. The distal vessel is seen to fill from left to right collaterals. Recommendations: Medical management of CAD. No focal targets for PCI.   ECHOCARDIOGRAM COMPLETE  Result Date: 01/28/2021    ECHOCARDIOGRAM REPORT   Patient Name:   Spencer [name] Date of Exam: 01/28/2021 Medical Rec #:  [id number]      Height:       73.0 in Accession #:    [id number]     Weight:       274.3 lb Date of Birth:  Jul 02, 1955      BSA:          2.461 m Patient Age:    65 years       BP:           151/71 mmHg Patient Gender: M              HR:           70 bpm. Exam Location:  Inpatient Procedure: 2D Echo, Cardiac Doppler, Color Doppler and Intracardiac            Opacification Agent Indications:    Chest Pain  History:        Patient has prior history of Echocardiogram examinations, most                 recent 07/23/2013. CAD, Prior CABG, Signs/Symptoms:Shortness of                 Breath; Risk Factors:Hypertension, Diabetes and Dyslipidemia.  Sonographer:    Wenda Low Referring Phys: Burns Comments: Patient is morbidly obese. IMPRESSIONS  1. Left ventricular ejection  fraction, by estimation, is 55 to 60%. The left ventricle has normal function. The left ventricle has no regional wall motion abnormalities. Left ventricular diastolic parameters are consistent with Grade II diastolic dysfunction (pseudonormalization). Elevated left ventricular end-diastolic pressure. The E/e' is 16.4.  2. Right ventricular systolic function is normal. The right ventricular size is normal. Tricuspid regurgitation signal is inadequate for assessing PA pressure.  3. The mitral valve is grossly normal. Trivial mitral valve regurgitation. No evidence of mitral stenosis.  4. The aortic valve is tricuspid. There is moderate calcification of the aortic valve. There is moderate thickening of the aortic valve. Aortic valve regurgitation is not visualized. Mild aortic valve stenosis. Aortic valve area, by VTI measures 1.88 cm. Aortic valve mean gradient measures 6.0 mmHg. Aortic valve Vmax measures 1.80 m/s.  5. There is mild dilatation of the ascending aorta, measuring 40 mm.  6. The inferior vena cava is normal in size with greater than 50% respiratory variability, suggesting right atrial pressure of 3 mmHg. FINDINGS  Left Ventricle: Left ventricular ejection fraction, by estimation, is 55 to 60%. The left ventricle has normal function. The left ventricle has no regional wall motion abnormalities. Definity contrast agent was given IV to delineate the left ventricular  endocardial borders. The left ventricular internal cavity size was normal in size. There is no left ventricular hypertrophy. Abnormal (paradoxical) septal motion consistent with post-operative status. Left ventricular diastolic parameters are consistent  with Grade II diastolic dysfunction (pseudonormalization). Elevated left ventricular end-diastolic pressure. The E/e' is 16.4. Right Ventricle: The right ventricular size is normal. No increase in right ventricular wall thickness. Right ventricular systolic function is normal. Tricuspid  regurgitation signal is inadequate for assessing PA pressure. Left Atrium: Left atrial size was normal in size. Right Atrium: Right atrial size was normal in size. Pericardium: There is no evidence of pericardial effusion. Mitral Valve: The mitral valve is grossly normal. Mild mitral annular calcification. Trivial mitral valve regurgitation. No evidence of mitral valve stenosis. Tricuspid Valve: The tricuspid valve is grossly normal. Tricuspid valve regurgitation is trivial. No evidence of tricuspid stenosis. Aortic Valve: The aortic valve is tricuspid. There is moderate calcification of the aortic valve. There is moderate thickening of the aortic valve. Aortic valve regurgitation is not visualized. Mild aortic stenosis is present. Aortic valve mean gradient measures 6.0 mmHg. Aortic valve peak gradient measures 13.0 mmHg. Aortic valve area, by VTI measures 1.88 cm. Pulmonic Valve: The pulmonic valve was grossly normal. Pulmonic valve regurgitation is not visualized. No evidence of pulmonic stenosis. Aorta: The aortic root is normal in size and structure. There is mild dilatation of the ascending aorta, measuring 40 mm. Venous: The inferior vena cava is normal in size with greater than 50% respiratory variability, suggesting right atrial pressure of 3 mmHg. IAS/Shunts: The atrial septum is  grossly normal.  LEFT VENTRICLE PLAX 2D LVIDd:         5.40 cm   Diastology LVIDs:         3.50 cm   LV e' medial:    5.22 cm/s LV PW:         1.30 cm   LV E/e' medial:  16.4 LV IVS:        1.40 cm   LV e' lateral:   7.27 cm/s LVOT diam:     2.20 cm   LV E/e' lateral: 11.8 LV SV:         71 LV SV Index:   29 LVOT Area:     3.80 cm  LEFT ATRIUM             Index        RIGHT ATRIUM           Index LA diam:        4.60 cm 1.87 cm/m   RA Area:     18.20 cm LA Vol (A2C):   58.0 ml 23.57 ml/m  RA Volume:   45.10 ml  18.33 ml/m LA Vol (A4C):   63.9 ml 25.97 ml/m LA Biplane Vol: 61.6 ml 25.03 ml/m  AORTIC VALVE                      PULMONIC VALVE AV Area (Vmax):    1.72 cm      PV Vmax:       0.74 m/s AV Area (Vmean):   1.73 cm      PV Peak grad:  2.2 mmHg AV Area (VTI):     1.88 cm AV Vmax:           180.00 cm/s AV Vmean:          112.000 cm/s AV VTI:            0.378 m AV Peak Grad:      13.0 mmHg AV Mean Grad:      6.0 mmHg LVOT Vmax:         81.60 cm/s LVOT Vmean:        51.000 cm/s LVOT VTI:          0.187 m LVOT/AV VTI ratio: 0.49  AORTA Ao Root diam: 3.60 cm Ao Asc diam:  4.00 cm MITRAL VALVE MV Area (PHT): 3.19 cm     SHUNTS MV Decel Time: 238 msec     Systemic VTI:  0.19 m MV E velocity: 85.70 cm/s   Systemic Diam: 2.20 cm MV A velocity: 109.00 cm/s MV E/A ratio:  0.79 Eleonore Chiquito MD Electronically signed by Eleonore Chiquito MD Signature Date/Time: 01/28/2021/1:03:38 PM    Final     Cardiac Studies   Echocardiogram 01/28/2021: IMPRESSIONS     1. Left ventricular ejection fraction, by estimation, is 55 to 60%. The  left ventricle has normal function. The left ventricle has no regional  wall motion abnormalities. Left ventricular diastolic parameters are  consistent with Grade II diastolic  dysfunction (pseudonormalization). Elevated left ventricular end-diastolic  pressure. The E/e' is 16.4.   2. Right ventricular systolic function is normal. The right ventricular  size is normal. Tricuspid regurgitation signal is inadequate for assessing  PA pressure.   3. The mitral valve is grossly normal. Trivial mitral valve  regurgitation. No evidence of mitral stenosis.   4. The aortic valve is tricuspid. There is moderate calcification of the  aortic valve. There is  moderate thickening of the aortic valve. Aortic  valve regurgitation is not visualized. Mild aortic valve stenosis. Aortic  valve area, by VTI measures 1.88  cm. Aortic valve mean gradient measures 6.0 mmHg. Aortic valve Vmax  measures 1.80 m/s.   5. There is mild dilatation of the ascending aorta, measuring 40 mm.   6. The inferior vena cava is normal  in size with greater than 50%  respiratory variability, suggesting right atrial pressure of 3 mmHg.    Cardiac cath 01/29/2021: Diagnostic Dominance: Right   Patient Profile     65 y.o. male with CAD, CABG 2012 ICM EF 45%, and DES to VG to OM and VG to RCA in 2015, DM-2, carotid disease, obesity, HLD, intolerant to statins, who is being seen 01/27/2021 for the evaluation of chest pain.  Assessment & Plan    Chronic ischemic heart disease with angina: Uptitrate isosorbide as tolerated as outpatient. Chronic diastolic heart failure: Continue SGLT2 therapy and diuresis as tolerated by kidney function.   Plan discharge today.   For questions or updates, please contact [facility] Please consult [email or web address] for contact info under        Signed, Sinclair Grooms, MD  01/30/2021, 9:39 AM

## 2021-01-30 NOTE — Progress Notes (Signed)
Discharge teaching complete. Meds, diet, activity, follow up appointments, TR band site care reviewed and all questions answered. Copy of instructions given to patient and medication brought to bedside by [facility] pharmacy. Patient discharged home via wheelchair with wife.

## 2021-01-31 ENCOUNTER — Other Ambulatory Visit: Payer: Self-pay | Admitting: *Deleted

## 2021-01-31 NOTE — Patient Outreach (Signed)
[facility]) Care Management  01/31/2021  KAELON WEEKES 09-Feb-1956 [id number]   Transition of care call   Referral received: 12/13 Initial outreach: 12/14 Insurance: [facility] Focus/Save/Choice Plan   Subjective: Initial successful telephone call to patient's preferred number in order to complete transition of care assessment; 2 HIPAA identifiers verified. Explained purpose of call and completed transition of care assessment.   States he is doing well, denies post cath problems, states surgical pain well managed with prescribed medications, tolerating  diet , denies bowel or bladder problems.  Spouse is assisting  with his recovery.     Objective:  Mr. Ruta was hospitalized at [facility] from 12/10-12/13 for unstable angina.  Comorbidities include: CABG, DM, HLD, and HLD.  He was discharged to home on 12/13 without the need for home health services or DME.   Assessment:  Patient voices good understanding of all discharge instructions. Per wife, all medications are being taken according to discharge instructions, including changes.  Will follow up with cardiology on 12/23, she will accompany him to office visit.  See transition of care flowsheet for assessment details.   Plan:  No ongoing care management needs identified so will close case to [facility] care management services and route successful outreach letter with [facility] pamphlet and 24 Hour Nurse Line Magnet to [facility] clinical pool to be mailed to patient's home address.   Valente David, RN, MSN, [facility] Manager [phone]

## 2021-02-02 ENCOUNTER — Other Ambulatory Visit (HOSPITAL_BASED_OUTPATIENT_CLINIC_OR_DEPARTMENT_OTHER): Payer: Self-pay

## 2021-02-05 ENCOUNTER — Other Ambulatory Visit (HOSPITAL_BASED_OUTPATIENT_CLINIC_OR_DEPARTMENT_OTHER): Payer: Self-pay

## 2021-02-06 DIAGNOSIS — E1140 Type 2 diabetes mellitus with diabetic neuropathy, unspecified: Secondary | ICD-10-CM | POA: Diagnosis not present

## 2021-02-06 DIAGNOSIS — I200 Unstable angina: Secondary | ICD-10-CM | POA: Diagnosis not present

## 2021-02-06 DIAGNOSIS — E785 Hyperlipidemia, unspecified: Secondary | ICD-10-CM | POA: Diagnosis not present

## 2021-02-06 DIAGNOSIS — I25700 Atherosclerosis of coronary artery bypass graft(s), unspecified, with unstable angina pectoris: Secondary | ICD-10-CM | POA: Diagnosis not present

## 2021-02-06 DIAGNOSIS — I10 Essential (primary) hypertension: Secondary | ICD-10-CM | POA: Diagnosis not present

## 2021-02-07 ENCOUNTER — Other Ambulatory Visit (HOSPITAL_BASED_OUTPATIENT_CLINIC_OR_DEPARTMENT_OTHER): Payer: Self-pay

## 2021-02-08 ENCOUNTER — Other Ambulatory Visit (HOSPITAL_BASED_OUTPATIENT_CLINIC_OR_DEPARTMENT_OTHER): Payer: Self-pay

## 2021-02-09 ENCOUNTER — Ambulatory Visit (HOSPITAL_BASED_OUTPATIENT_CLINIC_OR_DEPARTMENT_OTHER): Payer: 59 | Admitting: Family

## 2021-02-20 NOTE — Progress Notes (Signed)
Cardiology Office Note:    Date:  02/21/2021   ID:  Spencer [name], DOB 06/17/1955, MRN [id number]  PCP:  Burnard Bunting, MD   [facility] Providers Cardiologist:  Kathlyn Sacramento, MD     Referring MD: Burnard Bunting, MD   Chief Complaint: f/u recent hospitalization for CAD  History of Present Illness:    Spencer [name] is a 66 y.o. male with a hx of CAD s/p CABG x 5 2012, ICM EF 45%, unstable angina, hyperlipidemia with intolerance to statins, T2DM, bilateral carotid artery disease, PAD, back pain, mild aortic stenosis, and obesity.  He had DES x 2 to both SVG to OM and SVG to RCA in 2015. He had vascular studies in 2019 that revealed unreliable ABIs due to calcification. Angiography was done June 2019 and revealed no significant aortoiliac disease, mild to moderate SFA and popliteal disease. One-vessel below the knee run off bilaterally via the peroneal artery which gave collaterals distally to the DP and plantar branches of the posterior tibial artery. No revascularization was needed. He has a history of severe myalgia with all statins.   He was last seen in our office on 10/17/20 by Dr. Fletcher Anon and 6 mo follow-up was recommended. On 01/27/21 he presented to [facility] ED for evaluation of chest pain and wrist pain. He reported wrist pain associated with previous MI. He reported a 2 month history of exertional chest pain that improved with rest. He had previously not reported the pain to his wife and when he told her she took him to the hospital. His troponin was mildly elevated. EKG revealed SR with RBBB and no acute changes. He underwent LHC on 01/29/21 which revealed 3 vessel native CAD, 3/5 patent grafts, no focal targets for PCI with recommendation for medical management.   He is here today with his wife and 2 granddaughters and reports that he is feeling great. He has had no further episodes of the chest pain or wrist pain that he had prior to hospital admission on 01/27/2021.  He has  resumed exercise in the pool at the [facility] without chest discomfort or shortness of breath. He reports 1 episode of lightheadedness that occurred on 02/12/2021 while in a department store. It resolved quickly and he has had no further episodes. He denies chest pain, shortness of breath, lower extremity edema, fatigue, palpitations, melena, hematuria, hemoptysis, diaphoresis, weakness, presyncope, syncope, orthopnea, and PND. He is active as much as possible but is limited to pool aerobics due to chronic back pain and cannot walk long distances without severe pain.    Past Medical History:  Diagnosis Date   Anxiety attack    Arthritis    "back; shoulders; knees" (07/23/2013)   Back pain, chronic    "all over" (07/23/2013)   Benign neoplasm of colon 04/01/2000   Hyperplastic   Coronary artery disease    a. NSTEMI 10/12: s/p CABG with L-LAD, S-OM1/OM2, S-RV marg/RCA;  b. 07/2013 Cath/PCI: LM <10, LAD 70-80p, 58m, D1 90, RI 30ost, LCX 40-50p, 72m, OM1 90, OM2 100, RCA 100p, VG->OM1->OM2 patent with 90 @ anast (2.5x12 Xience Alpine DES), RCA 100p, LIMA->LAD nl, VG->Diag nl, VG->RV marginal/RCA 90p (3.0x23 Xience Alpine DES);  c. 07/2013 Echo: EF 55-60%.   Depression    "pain related"   DM2 (diabetes mellitus, type 2) (HCC)    GERD (gastroesophageal reflux disease)    Hyperlipidemia    Hypertension    IBS (irritable bowel syndrome)    Morbidly obese (HCC)  Past Surgical History:  Procedure Laterality Date   ABDOMINAL AORTOGRAM W/LOWER EXTREMITY N/A 07/30/2017   Procedure: ABDOMINAL AORTOGRAM W/LOWER EXTREMITY;  Surgeon: Wellington Hampshire, MD;  Location: [place] [facility];  Service: Cardiovascular;  Laterality: N/A;   BACK SURGERY     CARDIAC CATHETERIZATION  11/2010   CARPAL TUNNEL RELEASE Bilateral    CORONARY ANGIOPLASTY WITH STENT PLACEMENT  07/23/2013   "2"   CORONARY ARTERY BYPASS GRAFT  11/26/10   x 6 SURGEON DR. PETER VAN TRIGT   HERNIA REPAIR     UHR   LEFT HEART CATH AND CORS/GRAFTS  ANGIOGRAPHY N/A 01/29/2021   Procedure: LEFT HEART CATH AND CORS/GRAFTS ANGIOGRAPHY;  Surgeon: Burnell Blanks, MD;  Location: [place] [facility];  Service: Cardiovascular;  Laterality: N/A;   LEFT HEART CATHETERIZATION WITH CORONARY/GRAFT ANGIOGRAM N/A 07/23/2013   Procedure: LEFT HEART CATHETERIZATION WITH Beatrix Fetters;  Surgeon: Peter M Martinique, MD;  Location: [facility] C[facility] LAB;  Service: Cardiovascular;  Laterality: N/A;   LUMBAR FUSION     MULTIPLE; DR. Ellene Route   SHOULDER ARTHROSCOPY W/ ROTATOR CUFF REPAIR Bilateral    UMBILICAL HERNIA REPAIR      Current Medications: Current Meds  Medication Sig   Ascorbic Acid (VITAMIN C) 1000 MG tablet Take 1,000 mg by mouth daily.   aspirin EC 81 MG tablet Take 81 mg by mouth daily.   carvedilol (COREG) 6.25 MG tablet TAKE 1 TABLET BY MOUTH TWICE DAILY (Patient taking differently: Take 6.25 mg by mouth 2 (two) times daily.)   cholecalciferol (VITAMIN D) 1000 UNITS tablet Take 1,000 Units by mouth daily.   docusate sodium (COLACE) 100 MG capsule Take 100 mg by mouth 2 (two) times daily.   empagliflozin (JARDIANCE) 25 MG TABS tablet Take 1 tablet by mouth once a day (Patient taking differently: Take 25 mg by mouth daily.)   Evolocumab (REPATHA SURECLICK) 595 MG/ML SOAJ Inject 140mg  every 2 weeks Subcutaneous 30 days (Patient taking differently: Inject 140 mg into the skin every 14 (fourteen) days.)   famotidine (PEPCID) 20 MG tablet TAKE 2 TABLETS BY MOUTH TWO TIMES DAILY (Patient taking differently: Take 40 mg by mouth 2 (two) times daily.)   FREESTYLE LITE test strip    HUMALOG KWIKPEN 100 UNIT/ML KwikPen Inject 4 Units into the skin 3 (three) times daily as needed (high blood sugar level). With meals as directed   icosapent Ethyl (VASCEPA) 1 g capsule Take 2 capsules by mouth with meals twice a day (Patient taking differently: Take 2 g by mouth in the morning and at bedtime.)   insulin glargine, 2 Unit Dial, (TOUJEO MAX SOLOSTAR) 300  UNIT/ML Solostar Pen Inject 32 units into the skin daily and increase as directed in clinic up to 100 units. (Patient taking differently: Inject 32 Units into the skin daily.)   Insulin Pen Needle 32G X 6 MM MISC Use with Victoza daily.   isosorbide mononitrate (IMDUR) 30 MG 24 hr tablet Take 1 tablet (30 mg total) by mouth daily.   lisinopril (PRINIVIL,ZESTRIL) 40 MG tablet Take 1 tablet (40 mg total) by mouth daily.   metFORMIN (GLUCOPHAGE-XR) 500 MG 24 hr tablet Take 1 tablet by mouth twice a day (Patient taking differently: Take 500 mg by mouth in the morning and at bedtime.)   methocarbamol (ROBAXIN) 500 MG tablet TAKE 1 TABLET BY MOUTH 3 TIMES DAILY AS NEEDED (Patient taking differently: Take 500 mg by mouth 3 (three) times daily.)   metroNIDAZOLE (METROGEL) 1 % gel APPLY  TO AFFECTED AREAS EXTERNALLY DAILY (Patient taking differently: Apply 1 application topically daily as needed (rosacea flares).)   nitroGLYCERIN (NITROSTAT) 0.4 MG SL tablet Place 1 tablet (0.4 mg total) under the tongue every 5 (five) minutes as needed for chest pain (CP or SOB).   PENTIPS 31G X 5 MM MISC    polyethylene glycol powder (GLYCOLAX/MIRALAX) 17 GM/SCOOP powder Take 17 g by mouth daily as needed (constipation).   prasugrel (EFFIENT) 10 MG TABS tablet Take 1 tablet (10 mg total) by mouth daily.   pregabalin (LYRICA) 150 MG capsule Take 1 capsule (150 mg total) by mouth 2 (two) times daily.   Probiotic Product (PROBIOTIC PO) Take 1 capsule by mouth daily. Keifer probiotic   pyridOXINE (VITAMIN B-6) 100 MG tablet Take 100 mg by mouth daily.   sertraline (ZOLOFT) 50 MG tablet TAKE 1 TABLET BY MOUTH TWICE DAILY   traMADol (ULTRAM) 50 MG tablet Take 1-2 tablets (50-100 mg total) by mouth every 6 (six) hours as needed for pain. (Patient taking differently: Take 50-100 mg by mouth every 6 (six) hours.)   zolpidem (AMBIEN) 10 MG tablet Take 1 tablet by mouth at bedtime as needed for insomnia (Patient taking differently:  Take 10 mg by mouth at bedtime.)     Allergies:   Victoza [liraglutide], Brilinta [ticagrelor], Codeine, Other, and Statins   Social History   Socioeconomic History   Marital status: Married    Spouse name: Not on file   Number of children: Not on file   Years of education: Not on file   Highest education level: Not on file  Occupational History   Occupation: RETIRED   Occupation: disabled    Employer: DISABLED  Tobacco Use   Smoking status: Never   Smokeless tobacco: Never  Vaping Use   Vaping Use: Never used  Substance and Sexual Activity   Alcohol use: No   Drug use: No   Sexual activity: Not Currently  Other Topics Concern   Not on file  Social History Narrative   WATER AEROBICS 4-5 DAYS A WEEK   Social Determinants of Health   Financial Resource Strain: Not on file  Food Insecurity: Not on file  Transportation Needs: Not on file  Physical Activity: Not on file  Stress: Not on file  Social Connections: Not on file     Family History: The patient's family history includes Heart attack in his brother; Heart disease in his brother and father. There is no history of Peripheral vascular disease, Diabetes, Colon cancer, Pancreatic cancer, Prostate cancer, or Rectal cancer.  ROS:   Please see the history of present illness.    +right lower extremity edema All other systems reviewed and are negative.  Labs/Other Studies Reviewed:    The following studies were reviewed today:  LHC 01/29/21  Prox RCA lesion is 100% stenosed.   Dist RCA lesion is 100% stenosed.   Origin to Prox Graft lesion is 100% stenosed.   1st Diag lesion is 90% stenosed.   Prox Graft lesion is 100% stenosed.   Mid LAD lesion is 100% stenosed.   2nd Mrg lesion is 100% stenosed.   3rd Mrg lesion is 100% stenosed.   Ost Cx to Prox Cx lesion is 90% stenosed.   Mid Cx to Dist Cx lesion is 90% stenosed.   Previously placed Mid Graft to Insertion stent (unknown type)  between 2nd Mrg and 3rd  Mrg is  widely patent.   SVG graft was visualized by angiography and is  normal in caliber.   SVG graft was visualized by angiography.   SVG graft was visualized by angiography.   LIMA graft was visualized by angiography.   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  Chronic occlusion mid LAD. The free LIMA is attached to the vein graft pedicle of the SVG to OM1/OM2. The Diagonal branch has severe disease. The SVG to the Diagonal is now occluded. This is chronic 3.  Severe disease throughout the Circumflex. Both OM branches are occluded. The sequential SVG to OM1/OM2 is patent. Patent stent in the anastomosis of SVG to OM2.  4. The RCA is chronically occluded. The SVG to the PDA is now occluded within the proximal stent. The distal vessel is seen to fill from left to right collaterals.    Recommendations: Medical management of CAD. No focal targets for PCI.     Echo 01/28/21  Left Ventricle: Left ventricular ejection fraction, by estimation, is 55  to 60%. The left ventricle has normal function. The left ventricle has no  regional wall motion abnormalities. Definity contrast agent was given IV  to delineate the left ventricular endocardial borders. The left ventricular internal cavity size was normal in size. There is no left ventricular hypertrophy. Abnormal (paradoxical) septal motion consistent with post-operative status. Left ventricular  diastolic parameters are consistent  with Grade II diastolic dysfunction (pseudonormalization). Elevated left ventricular end-diastolic pressure. The E/e' is 16.4.  Right Ventricle: The right ventricular size is normal. No increase in  right ventricular wall thickness. Right ventricular systolic function is  normal. Tricuspid regurgitation signal is inadequate for assessing PA  pressure.  Left Atrium: Left atrial size was normal in size.  Right Atrium: Right atrial size was normal in size.  Pericardium: There is no evidence of  pericardial effusion.  Mitral Valve: The mitral valve is grossly normal. Mild mitral annular  calcification. Trivial mitral valve regurgitation. No evidence of mitral  valve stenosis.  Tricuspid Valve: The tricuspid valve is grossly normal. Tricuspid valve  regurgitation is trivial. No evidence of tricuspid stenosis.  Aortic Valve: The aortic valve is tricuspid. There is moderate  calcification of the aortic valve. There is moderate thickening of the  aortic valve. Aortic valve regurgitation is not visualized. Mild aortic  stenosis is present. Aortic valve mean gradient  measures 6.0 mmHg. Aortic valve peak gradient measures 13.0 mmHg. Aortic  valve area, by VTI measures 1.88 cm.  Pulmonic Valve: The pulmonic valve was grossly normal. Pulmonic valve  regurgitation is not visualized. No evidence of pulmonic stenosis.  Aorta: The aortic root is normal in size and structure. There is mild  dilatation of the ascending aorta, measuring 40 mm.  Venous: The inferior vena cava is normal in size with greater than 50%  respiratory variability, suggesting right atrial pressure of 3 mmHg.  IAS/Shunts: The atrial septum is grossly normal.   Carotid US 04/04/20  Right Carotid: Velocities in the right ICA are consistent with a 40-59%                 stenosis. Non-hemodynamically significant plaque <50% noted in the  CCA.   Left Carotid: Velocities in the left ICA are consistent with a 60-79%  stenosis.   Vertebrals: Left vertebral artery demonstrates antegrade flow. Right  vertebral              artery demonstrates high resistant flow.  Subclavians: Normal flow hemodynamics were seen in bilateral subclavian  arteries.   *See table(s) above for measurements and observations.  Suggest follow up study in 12 months.   Recent Labs: 01/27/2021: ALT 22; Magnesium 2.0; TSH 1.925 01/30/2021: BUN 25; Creatinine, Ser 1.06; Hemoglobin 13.8; Platelets 219; Potassium 4.1; Sodium 135  Recent  Lipid Panel    Component Value Date/Time   CHOL 106 01/28/2021 0323   CHOL 135 12/23/2017 0907   TRIG 198 (H) 01/28/2021 0323   HDL 34 (L) 01/28/2021 0323   HDL 35 (L) 12/23/2017 0907   CHOLHDL 3.1 01/28/2021 0323   VLDL 40 01/28/2021 0323   LDLCALC 32 01/28/2021 0323   LDLCALC 55 12/23/2017 0907      Physical Exam:    VS:  BP 124/62 (BP Location: Right Arm, Patient Position: Sitting, Cuff Size: Large)    Pulse 61    Ht 6\' 1"  (1.854 m)    Wt 284 lb 9.6 oz (129.1 kg)    SpO2 95%    BMI 37.55 kg/m     Wt Readings from Last 3 Encounters:  02/21/21 284 lb 9.6 oz (129.1 kg)  01/27/21 274 lb 4.8 oz (124.4 kg)  10/17/20 280 lb 3.2 oz (127.1 kg)     GEN: Well nourished, well developed in no acute distress HEENT: Normal NECK: No JVD; No carotid bruits LYMPHATICS: No lymphadenopathy CARDIAC: RRR, no murmurs, rubs, gallops RESPIRATORY:  Clear to auscultation without rales, wheezing or rhonchi  ABDOMEN: Soft, non-tender, non-distended MUSCULOSKELETAL:  Mild, non-pitting left LE edema; No deformity  SKIN: Warm and dry. Left radial cath site well healed without s/s infection. NEUROLOGIC:  Alert and oriented x 3 PSYCHIATRIC:  Normal affect   EKG:  EKG is not ordered today.    Diagnoses:    1. Hyperlipidemia LDL goal <70   2. Coronary artery disease involving native coronary artery of native heart without angina pectoris   3. S/P CABG x 5   4. Mild aortic valve stenosis   5. Type 2 diabetes mellitus with complication, with long-term current use of insulin (HCC)   6. Leg edema, left    Assessment and Plan:     CAD without angina s/p CABG x 5, DES x 2 SVG to OA and SVG to RCA: He was admitted for angina on 01/27/21 and had left heart cath which did not reveal any targets for PCI. Medical management was recommended. Today he is feeling well and has had no further episodes of chest pain.  He is exercising 3 times per week doing pool aerobics without any concern for activity  intolerance. Continue regular moderate exercise and heart healthy diet. No indication for up-titration of nitrates or other GDMT for CAD at this time. Continue Imdur, Effient, aspirin, carvedilol, empagliflozin.  Essential hypertension: Blood pressure is stable today.  He monitors BP at home regularly and reports similar readings from home. He had an isolated episode of lightheadedness approximately 1 week ago, but no further episodes. Continue lisinopril, carvedilol, Imdur.   Leg edema: He has mild edema of the LLE. He reports it does not bother him and he thinks it is associated with left knee that needs to be replaced. Encouraged leg elevation. Continue lisinopril, carvedilol, Imdur.   Mild aortic valve stenosis: Echocardiogram on 01/28/2021 revealed mild aortic valve stenosis.  Prior echo from 2015 did not note aortic valve stenosis.  He denies dizziness, presyncope, syncope, chest pain. Will continue to follow with repeat echo in 3-5 years unless he develops symptoms sooner.   T2DM: A1c 8.3 on 01/27/21.  He is on empagliflozin, insulin, metformin. Encouraged continued heart healthy diet and regular exercise. Management per PCP.   Hyperlipidemia LDL goal < 70: LDL 32 on 01/28/21, at goal. Continue Vasce[place], [place].    Disposition: F/u in 6 weeks with Dr. Fletcher Anon     Medication Adjustments/Labs and Tests Ordered: Current medicines are reviewed at length with the patient today.  Concerns regarding medicines are outlined above.  No orders of the defined types were placed in this encounter.  No orders of the defined types were placed in this encounter.   Patient Instructions  Medication Instructions:  Your Physician recommend you continue on your current medication as directed.    *If you need a refill on your cardiac medications before your next appointment, please call your pharmacy*   Lab Work: None today    Testing/Procedures: None today    Follow-Up: At [facility], you  and your health needs are our priority.  As part of our continuing mission to provide you with exceptional heart care, we have created designated Provider Care Teams.  These Care Teams include your primary Cardiologist (physician) and Advanced Practice Providers (APPs -  Physician Assistants and Nurse Practitioners) who all work together to provide you with the care you need, when you need it.  We recommend signing up for the patient portal called "MyChart".  Sign up information is provided on this After Visit Summary.  MyChart is used to connect with patients for Virtual Visits (Telemedicine).  Patients are able to view lab/test results, encounter notes, upcoming appointments, etc.  Non-urgent messages can be sent to your provider as well.   To learn more about what you can do with MyChart, go to [email or web address].    Your next appointment:   Follow up as Scheduled   The format for your next appointment:   In Person  Provider:   Kathlyn Sacramento, MD     Other Instructions Everything looks great today, keep up the good work!     Signed, Emmaline Life, NP  02/21/2021 4:06 PM    [facility]

## 2021-02-21 ENCOUNTER — Other Ambulatory Visit: Payer: Self-pay

## 2021-02-21 ENCOUNTER — Ambulatory Visit (INDEPENDENT_AMBULATORY_CARE_PROVIDER_SITE_OTHER): Payer: 59 | Admitting: Nurse Practitioner

## 2021-02-21 ENCOUNTER — Encounter (HOSPITAL_BASED_OUTPATIENT_CLINIC_OR_DEPARTMENT_OTHER): Payer: Self-pay | Admitting: Nurse Practitioner

## 2021-02-21 VITALS — BP 124/62 | HR 61 | Ht 73.0 in | Wt 284.6 lb

## 2021-02-21 DIAGNOSIS — Z794 Long term (current) use of insulin: Secondary | ICD-10-CM | POA: Diagnosis not present

## 2021-02-21 DIAGNOSIS — I2510 Atherosclerotic heart disease of native coronary artery without angina pectoris: Secondary | ICD-10-CM | POA: Diagnosis not present

## 2021-02-21 DIAGNOSIS — E785 Hyperlipidemia, unspecified: Secondary | ICD-10-CM

## 2021-02-21 DIAGNOSIS — E118 Type 2 diabetes mellitus with unspecified complications: Secondary | ICD-10-CM | POA: Diagnosis not present

## 2021-02-21 DIAGNOSIS — R600 Localized edema: Secondary | ICD-10-CM

## 2021-02-21 DIAGNOSIS — I350 Nonrheumatic aortic (valve) stenosis: Secondary | ICD-10-CM

## 2021-02-21 DIAGNOSIS — Z951 Presence of aortocoronary bypass graft: Secondary | ICD-10-CM

## 2021-02-21 NOTE — Patient Instructions (Signed)
Medication Instructions:  Your Physician recommend you continue on your current medication as directed.    *If you need a refill on your cardiac medications before your next appointment, please call your pharmacy*   Lab Work: None today    Testing/Procedures: None today    Follow-Up: At [facility], you and your health needs are our priority.  As part of our continuing mission to provide you with exceptional heart care, we have created designated Provider Care Teams.  These Care Teams include your primary Cardiologist (physician) and Advanced Practice Providers (APPs -  Physician Assistants and Nurse Practitioners) who all work together to provide you with the care you need, when you need it.  We recommend signing up for the patient portal called "MyChart".  Sign up information is provided on this After Visit Summary.  MyChart is used to connect with patients for Virtual Visits (Telemedicine).  Patients are able to view lab/test results, encounter notes, upcoming appointments, etc.  Non-urgent messages can be sent to your provider as well.   To learn more about what you can do with MyChart, go to [email or web address].    Your next appointment:   Follow up as Scheduled   The format for your next appointment:   In Person  Provider:   Kathlyn Sacramento, MD     Other Instructions Everything looks great today, keep up the good work!

## 2021-02-27 ENCOUNTER — Other Ambulatory Visit (HOSPITAL_BASED_OUTPATIENT_CLINIC_OR_DEPARTMENT_OTHER): Payer: Self-pay

## 2021-02-27 MED ORDER — ZOLPIDEM TARTRATE 10 MG PO TABS
10.0000 mg | ORAL_TABLET | Freq: Every evening | ORAL | 0 refills | Status: DC | PRN
  Filled 2021-02-27: qty 90, 90d supply, fill #0

## 2021-02-28 ENCOUNTER — Other Ambulatory Visit (HOSPITAL_BASED_OUTPATIENT_CLINIC_OR_DEPARTMENT_OTHER): Payer: Self-pay

## 2021-02-28 MED ORDER — ZOLPIDEM TARTRATE 10 MG PO TABS
ORAL_TABLET | ORAL | 1 refills | Status: DC
  Filled 2021-02-28: qty 90, 90d supply, fill #0

## 2021-03-02 ENCOUNTER — Other Ambulatory Visit (HOSPITAL_BASED_OUTPATIENT_CLINIC_OR_DEPARTMENT_OTHER): Payer: Self-pay

## 2021-03-02 MED ORDER — METFORMIN HCL ER 500 MG PO TB24
ORAL_TABLET | ORAL | 0 refills | Status: AC
  Filled 2021-04-10: qty 360, 90d supply, fill #0

## 2021-03-06 ENCOUNTER — Other Ambulatory Visit (HOSPITAL_BASED_OUTPATIENT_CLINIC_OR_DEPARTMENT_OTHER): Payer: Self-pay

## 2021-03-12 ENCOUNTER — Other Ambulatory Visit (HOSPITAL_BASED_OUTPATIENT_CLINIC_OR_DEPARTMENT_OTHER): Payer: Self-pay

## 2021-03-12 MED ORDER — INSULIN PEN NEEDLE 32G X 6 MM MISC
3 refills | Status: DC
  Filled 2021-03-12 – 2021-03-15 (×2): qty 400, 90d supply, fill #0

## 2021-03-15 ENCOUNTER — Other Ambulatory Visit: Payer: Self-pay | Admitting: Nurse Practitioner

## 2021-03-15 ENCOUNTER — Other Ambulatory Visit (HOSPITAL_BASED_OUTPATIENT_CLINIC_OR_DEPARTMENT_OTHER): Payer: Self-pay

## 2021-03-15 DIAGNOSIS — E1140 Type 2 diabetes mellitus with diabetic neuropathy, unspecified: Secondary | ICD-10-CM | POA: Diagnosis not present

## 2021-03-15 DIAGNOSIS — I10 Essential (primary) hypertension: Secondary | ICD-10-CM | POA: Diagnosis not present

## 2021-03-15 DIAGNOSIS — Z794 Long term (current) use of insulin: Secondary | ICD-10-CM | POA: Diagnosis not present

## 2021-03-15 DIAGNOSIS — I2510 Atherosclerotic heart disease of native coronary artery without angina pectoris: Secondary | ICD-10-CM | POA: Diagnosis not present

## 2021-03-15 DIAGNOSIS — G720 Drug-induced myopathy: Secondary | ICD-10-CM | POA: Diagnosis not present

## 2021-03-15 MED ORDER — NITROGLYCERIN 0.4 MG SL SUBL
0.4000 mg | SUBLINGUAL_TABLET | SUBLINGUAL | 3 refills | Status: AC | PRN
  Filled 2021-03-15: qty 25, 1d supply, fill #0

## 2021-03-15 MED ORDER — NITROGLYCERIN 0.4 MG SL SUBL: SUBLINGUAL_TABLET | SUBLINGUAL | 6 refills | Status: DC

## 2021-03-16 ENCOUNTER — Other Ambulatory Visit (HOSPITAL_BASED_OUTPATIENT_CLINIC_OR_DEPARTMENT_OTHER): Payer: Self-pay

## 2021-03-22 ENCOUNTER — Other Ambulatory Visit (HOSPITAL_BASED_OUTPATIENT_CLINIC_OR_DEPARTMENT_OTHER): Payer: Self-pay

## 2021-03-23 ENCOUNTER — Other Ambulatory Visit (HOSPITAL_BASED_OUTPATIENT_CLINIC_OR_DEPARTMENT_OTHER): Payer: Self-pay

## 2021-03-27 ENCOUNTER — Ambulatory Visit (HOSPITAL_COMMUNITY)
Admission: RE | Admit: 2021-03-27 | Payer: 59 | Source: Ambulatory Visit | Attending: Cardiovascular Disease | Admitting: Cardiovascular Disease

## 2021-03-28 ENCOUNTER — Ambulatory Visit: Payer: 59 | Attending: Internal Medicine

## 2021-03-28 ENCOUNTER — Other Ambulatory Visit: Payer: Self-pay

## 2021-03-28 ENCOUNTER — Other Ambulatory Visit (HOSPITAL_COMMUNITY): Payer: Self-pay | Admitting: Cardiovascular Disease

## 2021-03-28 ENCOUNTER — Ambulatory Visit (HOSPITAL_COMMUNITY)
Admission: RE | Admit: 2021-03-28 | Discharge: 2021-03-28 | Disposition: A | Discharge status: Home / Self Care | Payer: 59 | Source: Ambulatory Visit | Attending: Cardiovascular Disease | Admitting: Cardiovascular Disease

## 2021-03-28 DIAGNOSIS — I6523 Occlusion and stenosis of bilateral carotid arteries: Secondary | ICD-10-CM | POA: Insufficient documentation

## 2021-03-28 DIAGNOSIS — Z23 Encounter for immunization: Secondary | ICD-10-CM

## 2021-03-28 NOTE — Progress Notes (Signed)
° °  Covid-19 Vaccination Clinic  Name:  CARNELIUS HAMMITT    MRN: [id number] DOB: 1955-08-25  03/28/2021  Mr. Rout was observed post Covid-19 immunization for 15 minutes without incident. He was provided with Vaccine Information Sheet and instruction to access the V-Safe system.   Mr. Galea was instructed to call 911 with any severe reactions post vaccine: Difficulty breathing  Swelling of face and throat  A fast heartbeat  A bad rash all over body  Dizziness and weakness   Immunizations Administered     Name Date Dose VIS Date Route   Moderna Covid-19 vaccine Bivalent Booster 03/28/2021 12:34 PM 0.5 mL 09/30/2020 Intramuscular   Manufacturer: Levan Hurst   Lot: [id number]   [place]: [id number]

## 2021-03-30 ENCOUNTER — Other Ambulatory Visit (HOSPITAL_BASED_OUTPATIENT_CLINIC_OR_DEPARTMENT_OTHER): Payer: Self-pay

## 2021-03-30 MED ORDER — MODERNA COVID-19 BIVAL BOOSTER 50 MCG/0.5ML IM SUSP
INTRAMUSCULAR | 0 refills | Status: AC
  Filled 2021-03-30: qty 0.5, 1d supply, fill #0

## 2021-04-04 ENCOUNTER — Other Ambulatory Visit (HOSPITAL_BASED_OUTPATIENT_CLINIC_OR_DEPARTMENT_OTHER): Payer: Self-pay

## 2021-04-05 ENCOUNTER — Other Ambulatory Visit (HOSPITAL_BASED_OUTPATIENT_CLINIC_OR_DEPARTMENT_OTHER): Payer: Self-pay

## 2021-04-06 DIAGNOSIS — H25013 Cortical age-related cataract, bilateral: Secondary | ICD-10-CM | POA: Diagnosis not present

## 2021-04-06 DIAGNOSIS — E119 Type 2 diabetes mellitus without complications: Secondary | ICD-10-CM | POA: Diagnosis not present

## 2021-04-06 DIAGNOSIS — H43813 Vitreous degeneration, bilateral: Secondary | ICD-10-CM | POA: Diagnosis not present

## 2021-04-06 DIAGNOSIS — H2513 Age-related nuclear cataract, bilateral: Secondary | ICD-10-CM | POA: Diagnosis not present

## 2021-04-10 ENCOUNTER — Other Ambulatory Visit (HOSPITAL_BASED_OUTPATIENT_CLINIC_OR_DEPARTMENT_OTHER): Payer: Self-pay

## 2021-04-16 ENCOUNTER — Other Ambulatory Visit (HOSPITAL_BASED_OUTPATIENT_CLINIC_OR_DEPARTMENT_OTHER): Payer: Self-pay

## 2021-04-16 MED ORDER — INSULIN LISPRO (1 UNIT DIAL) 100 UNIT/ML (KWIKPEN)
PEN_INJECTOR | SUBCUTANEOUS | 3 refills | Status: AC
  Filled 2021-04-16: qty 9, 75d supply, fill #0

## 2021-04-16 NOTE — Progress Notes (Signed)
Cardiology Office Note   Date:  04/17/2021   ID:  [name], [name] 01/08/1956, MRN [id number]  PCP:  Burnard Bunting, MD  Cardiologist:   Kathlyn Sacramento, MD   No chief complaint on file.      History of Present Illness: Spencer [name] is a 66 y.o. male who is here today for follow-up visit regarding peripheral arterial disease and CAD. He has known history of coronary artery disease status post CABG in 2012, mild ischemic cardiomyopathy with an EF of 45%, diabetes mellitus, bilateral carotid disease, obesity, hyperlipidemia with intolerance to statins and peripheral arterial disease.  He is status post PCI and drug-eluting stent placement to both SVG to OM and SVG to RCA in 2015.   He had vascular work-up done in 2019. ABI were not reliable due to calcifications.  Angiography in June 2019 showed no significant aortoiliac disease.  There was mild to moderate SFA and popliteal disease.  One-vessel runoff below the knee bilaterally via the peroneal artery which gave collaterals distally to the dorsalis pedis and plantar branches of the posterior tibial artery.  No revascularization was needed.    He was hospitalized in December with unstable angina.  Cardiac catheterization was done which showed patent free LIMA to LAD and SVG to OM1 and OM 2.  SVG to diagonal is known chronically occluded.  SVG to RCA was noted to be occluded which was new.  Left to right collaterals were noted.  No revascularization was needed.  He reports resolution of anginal chest pain.  No lower extremity claudication.  He does have ingrown toenails and wants them to be removed if possible given significant discomfort.    Past Medical History:  Diagnosis Date   Anxiety attack    Arthritis    "back; shoulders; knees" (07/23/2013)   Back pain, chronic    "all over" (07/23/2013)   Benign neoplasm of colon 04/01/2000   Hyperplastic   Coronary artery disease    a. NSTEMI 10/12: s/p CABG with L-LAD, S-OM1/OM2,  S-RV marg/RCA;  b. 07/2013 Cath/PCI: LM <10, LAD 70-80p, 71m, D1 90, RI 30ost, LCX 40-50p, 77m, OM1 90, OM2 100, RCA 100p, VG->OM1->OM2 patent with 90 @ anast (2.5x12 Xience Alpine DES), RCA 100p, LIMA->LAD nl, VG->Diag nl, VG->RV marginal/RCA 90p (3.0x23 Xience Alpine DES);  c. 07/2013 Echo: EF 55-60%.   Depression    "pain related"   DM2 (diabetes mellitus, type 2) (HCC)    GERD (gastroesophageal reflux disease)    Hyperlipidemia    Hypertension    IBS (irritable bowel syndrome)    Morbidly obese (HCC)     Past Surgical History:  Procedure Laterality Date   ABDOMINAL AORTOGRAM W/LOWER EXTREMITY N/A 07/30/2017   Procedure: ABDOMINAL AORTOGRAM W/LOWER EXTREMITY;  Surgeon: Wellington Hampshire, MD;  Location: [place];  Service: Cardiovascular;  Laterality: N/A;   BACK SURGERY     CARDIAC CATHETERIZATION  11/2010   CARPAL TUNNEL RELEASE Bilateral    CORONARY ANGIOPLASTY WITH STENT PLACEMENT  07/23/2013   "2"   CORONARY ARTERY BYPASS GRAFT  11/26/10   x 6 SURGEON DR. PETER VAN TRIGT   HERNIA REPAIR     UHR   LEFT HEART CATH AND CORS/GRAFTS ANGIOGRAPHY N/A 01/29/2021   Procedure: LEFT HEART CATH AND CORS/GRAFTS ANGIOGRAPHY;  Surgeon: Burnell Blanks, MD;  Location: [place] [facility];  Service: Cardiovascular;  Laterality: N/A;   LEFT HEART CATHETERIZATION WITH CORONARY/GRAFT ANGIOGRAM N/A 07/23/2013   Procedure: LEFT HEART  CATHETERIZATION WITH Beatrix Fetters;  Surgeon: Peter M Martinique, MD;  Location: [facility];  Service: Cardiovascular;  Laterality: N/A;   LUMBAR FUSION     MULTIPLE; DR. Ellene Route   SHOULDER ARTHROSCOPY W/ ROTATOR CUFF REPAIR Bilateral    UMBILICAL HERNIA REPAIR       Current Outpatient Medications  Medication Sig Dispense Refill   Ascorbic Acid (VITAMIN C) 1000 MG tablet Take 1,000 mg by mouth daily.     aspirin EC 81 MG tablet Take 81 mg by mouth daily.     carvedilol (COREG) 6.25 MG tablet TAKE 1 TABLET BY MOUTH TWICE DAILY (Patient taking  differently: Take 6.25 mg by mouth 2 (two) times daily.) 180 tablet 2   cholecalciferol (VITAMIN D) 1000 UNITS tablet Take 1,000 Units by mouth daily.     COVID-19 mRNA bivalent vaccine, Moderna, (MODERNA COVID-19 BIVAL BOOSTER) 50 MCG/0.5ML injection Inject into the muscle. 0.5 mL 0   docusate sodium (COLACE) 100 MG capsule Take 100 mg by mouth 2 (two) times daily.     empagliflozin (JARDIANCE) 25 MG TABS tablet Take 1 tablet by mouth once a day (Patient taking differently: Take 25 mg by mouth daily.) 90 tablet 3   Evolocumab (REPATHA SURECLICK) 256 MG/ML SOAJ Inject 140 mg into the skin every 14 (fourteen) days. 4 mL 11   famotidine (PEPCID) 20 MG tablet TAKE 2 TABLETS BY MOUTH TWO TIMES DAILY (Patient taking differently: Take 40 mg by mouth 2 (two) times daily.) 360 tablet 3   FREESTYLE LITE test strip      HUMALOG KWIKPEN 100 UNIT/ML KwikPen Inject 4 Units into the skin 3 (three) times daily as needed (high blood sugar level). With meals as directed     icosapent Ethyl (VASCEPA) 1 g capsule Take 2 capsules by mouth with meals twice a day (Patient taking differently: Take 2 g by mouth in the morning and at bedtime.) 360 capsule 3   insulin glargine, 2 Unit Dial, (TOUJEO MAX SOLOSTAR) 300 UNIT/ML Solostar Pen Inject 32 units into the skin daily and increase as directed in clinic up to 100 units. (Patient taking differently: Inject 32 Units into the skin daily.) 12 mL 11   insulin lispro (HUMALOG KWIKPEN) 100 UNIT/ML KwikPen Inject 4 units under the skin daily with meals. 12 mL 3   Insulin Pen Needle 32G X 6 MM MISC Use with Victoza daily. 100 each 3   isosorbide mononitrate (IMDUR) 30 MG 24 hr tablet Take 1 tablet (30 mg total) by mouth daily. 90 tablet 1   lisinopril (PRINIVIL,ZESTRIL) 40 MG tablet Take 1 tablet (40 mg total) by mouth daily. 30 tablet 6   metFORMIN (GLUCOPHAGE-XR) 500 MG 24 hr tablet Take 2 tablets by mouth twice a day as directed 360 tablet 0   methocarbamol (ROBAXIN) 500 MG  tablet TAKE 1 TABLET BY MOUTH 3 TIMES DAILY AS NEEDED (Patient taking differently: Take 500 mg by mouth 3 (three) times daily.) 270 tablet 3   metroNIDAZOLE (METROGEL) 1 % gel APPLY TO AFFECTED AREAS EXTERNALLY DAILY (Patient taking differently: Apply 1 application topically daily as needed (rosacea flares).) 60 g 6   nitroGLYCERIN (NITROSTAT) 0.4 MG SL tablet Place 1 tablet (0.4 mg total) under the tongue every 5 (five) minutes as needed for chest pain or shortness of breath 25 tablet 3   PENTIPS 31G X 5 MM MISC      polyethylene glycol powder (GLYCOLAX/MIRALAX) 17 GM/SCOOP powder Take 17 g by mouth daily as needed (constipation).  prasugrel (EFFIENT) 10 MG TABS tablet Take 1 tablet (10 mg total) by mouth daily. 90 tablet 3   pregabalin (LYRICA) 150 MG capsule Take 1 capsule (150 mg total) by mouth 2 (two) times daily. 180 capsule 3   Probiotic Product (PROBIOTIC PO) Take 1 capsule by mouth daily. Keifer probiotic     pyridOXINE (VITAMIN B-6) 100 MG tablet Take 100 mg by mouth daily.     sertraline (ZOLOFT) 50 MG tablet TAKE 1 TABLET BY MOUTH TWICE DAILY 180 tablet 2   traMADol (ULTRAM) 50 MG tablet Take 1-2 tablets (50-100 mg total) by mouth every 6 (six) hours as needed for pain. (Patient taking differently: Take 50-100 mg by mouth every 6 (six) hours.) 720 tablet 3   zolpidem (AMBIEN) 10 MG tablet Take 1 tablet by mouth at bedtime as needed for insomnia (Patient taking differently: Take 10 mg by mouth at bedtime.) 30 tablet 0   zolpidem (AMBIEN) 10 MG tablet Take 1 tablet (10 mg total) by mouth at bedtime as needed for insomnia. 90 tablet 0   No current facility-administered medications for this visit.    Allergies:   Victoza [liraglutide], Brilinta [ticagrelor], Codeine, Other, and Statins    Social History:  The patient  reports that he has never smoked. He has never used smokeless tobacco. He reports that he does not drink alcohol and does not use drugs.   Family History:  The  patient's family history includes Heart attack in his brother; Heart disease in his brother and father.    ROS:  Please see the history of present illness.   Otherwise, review of systems are positive for none.   All other systems are reviewed and negative.    PHYSICAL EXAM: VS:  BP 138/70 (BP Location: Left Arm, Patient Position: Sitting, Cuff Size: Normal)    Pulse 89    Resp 20    Ht 6\' 1"  (1.854 m)    Wt 279 lb 12.8 oz (126.9 kg)    SpO2 90%    BMI 36.92 kg/m  , BMI Body mass index is 36.92 kg/m. GEN: Well nourished, well developed, in no acute distress  HEENT: normal  Neck: no JVD, carotid bruits, or masses Cardiac: RRR; no murmurs, rubs, or gallops,no edema  Respiratory:  clear to auscultation bilaterally, normal work of breathing GI: soft, nontender, nondistended, + BS MS: no deformity or atrophy  Skin: warm and dry, no rash Neuro:  Strength and sensation are intact Psych: euthymic mood, full affect   EKG:  EKG is not ordered today.    Recent Labs: 01/27/2021: ALT 22; Magnesium 2.0; TSH 1.925 01/30/2021: BUN 25; Creatinine, Ser 1.06; Hemoglobin 13.8; Platelets 219; Potassium 4.1; Sodium 135    Lipid Panel    Component Value Date/Time   CHOL 106 01/28/2021 0323   CHOL 135 12/23/2017 0907   TRIG 198 (H) 01/28/2021 0323   HDL 34 (L) 01/28/2021 0323   HDL 35 (L) 12/23/2017 0907   CHOLHDL 3.1 01/28/2021 0323   VLDL 40 01/28/2021 0323   LDLCALC 32 01/28/2021 0323   LDLCALC 55 12/23/2017 0907      Wt Readings from Last 3 Encounters:  04/17/21 279 lb 12.8 oz (126.9 kg)  02/21/21 284 lb 9.6 oz (129.1 kg)  01/27/21 274 lb 4.8 oz (124.4 kg)       No flowsheet data found.    ASSESSMENT AND PLAN:  1.  Peripheral arterial disease: The patient has one-vessel runoff below the knee bilaterally via the  peroneal artery with no other obstructive disease.  Currently with no claudication or critical limb ischemia. He does have ingrown toenails and wants them removed due  to significant discomfort.  I am going to repeat his lower extremity arterial Doppler to ensure that his toe pressure is reasonable and allows tissue healing.   2.  Coronary artery disease involving native coronary arteries without angina: No further anginal symptoms.  Most recent catheterization in December showed occluded SVG to RCA but the vessel has good collaterals.  Continue medical therapy.  The patient is long-term dual antiplatelet therapy with aspirin and prasugrel.  3.  Bilateral carotid disease: Recent carotid Doppler showed moderate bilateral disease.  Repeat study in 1 year.   4.  Essential hypertension: Blood pressure is controlled on current medications.  5.  Mixed hyperlipidemia: He is intolerant to all statins due to severe myalgia.   Continue treatment with Repatha and Vascepa.   I reviewed most recent lipid profile done in December which showed a triglyceride of 198 and an LDL of 32.  6.  Preoperative cardiovascular evaluation: He reports the need for a urologic procedure.  The patient can proceed at an overall low risk with no need for ischemic evaluation.  Effient can be held 1 week before surgeries.   Disposition:   FU with me in 6 months  Signed,  Kathlyn Sacramento, MD  04/17/2021 8:06 AM    [place]

## 2021-04-17 ENCOUNTER — Ambulatory Visit (INDEPENDENT_AMBULATORY_CARE_PROVIDER_SITE_OTHER): Payer: 59 | Admitting: Cardiovascular Disease

## 2021-04-17 ENCOUNTER — Encounter: Payer: Self-pay | Admitting: Cardiovascular Disease

## 2021-04-17 ENCOUNTER — Other Ambulatory Visit: Payer: Self-pay | Admitting: Cardiovascular Disease

## 2021-04-17 ENCOUNTER — Other Ambulatory Visit: Payer: Self-pay

## 2021-04-17 VITALS — BP 138/70 | HR 89 | Resp 20 | Ht 73.0 in | Wt 279.8 lb

## 2021-04-17 DIAGNOSIS — E785 Hyperlipidemia, unspecified: Secondary | ICD-10-CM

## 2021-04-17 DIAGNOSIS — I739 Peripheral vascular disease, unspecified: Secondary | ICD-10-CM | POA: Diagnosis not present

## 2021-04-17 DIAGNOSIS — I6523 Occlusion and stenosis of bilateral carotid arteries: Secondary | ICD-10-CM

## 2021-04-17 DIAGNOSIS — I779 Disorder of arteries and arterioles, unspecified: Secondary | ICD-10-CM

## 2021-04-17 DIAGNOSIS — I10 Essential (primary) hypertension: Secondary | ICD-10-CM | POA: Diagnosis not present

## 2021-04-17 DIAGNOSIS — I2510 Atherosclerotic heart disease of native coronary artery without angina pectoris: Secondary | ICD-10-CM | POA: Diagnosis not present

## 2021-04-17 NOTE — Patient Instructions (Signed)
Medication Instructions:  No changes *If you need a refill on your cardiac medications before your next appointment, please call your pharmacy*   Lab Work: None ordered If you have labs (blood work) drawn today and your tests are completely normal, you will receive your results only by: MyChart Message (if you have MyChart) OR A paper copy in the mail If you have any lab test that is abnormal or we need to change your treatment, we will call you to review the results.   Testing/Procedures: Your physician has requested that you have a lower extremity segmental doppler. This will take place at [address], [address].  Follow-Up: At [facility], you and your health needs are our priority.  As part of our continuing mission to provide you with exceptional heart care, we have created designated Provider Care Teams.  These Care Teams include your primary Cardiologist (physician) and Advanced Practice Providers (APPs -  Physician Assistants and Nurse Practitioners) who all work together to provide you with the care you need, when you need it.  We recommend signing up for the patient portal called "MyChart".  Sign up information is provided on this After Visit Summary.  MyChart is used to connect with patients for Virtual Visits (Telemedicine).  Patients are able to view lab/test results, encounter notes, upcoming appointments, etc.  Non-urgent messages can be sent to your provider as well.   To learn more about what you can do with MyChart, go to [email or web address].    Your next appointment:   6 month(s)  The format for your next appointment:   In Person  Provider:   Muhammad Arida, MD   

## 2021-04-18 ENCOUNTER — Other Ambulatory Visit (HOSPITAL_BASED_OUTPATIENT_CLINIC_OR_DEPARTMENT_OTHER): Payer: Self-pay

## 2021-04-18 ENCOUNTER — Other Ambulatory Visit (HOSPITAL_COMMUNITY): Payer: Self-pay

## 2021-04-18 MED ORDER — CARVEDILOL 6.25 MG PO TABS
ORAL_TABLET | Freq: Two times a day (BID) | ORAL | 2 refills | Status: AC
  Filled 2021-04-19: qty 180, 90d supply, fill #0

## 2021-04-18 NOTE — Telephone Encounter (Signed)
Refill request

## 2021-04-19 ENCOUNTER — Other Ambulatory Visit (HOSPITAL_BASED_OUTPATIENT_CLINIC_OR_DEPARTMENT_OTHER): Payer: Self-pay

## 2021-04-20 ENCOUNTER — Other Ambulatory Visit (HOSPITAL_BASED_OUTPATIENT_CLINIC_OR_DEPARTMENT_OTHER): Payer: Self-pay

## 2021-04-23 ENCOUNTER — Other Ambulatory Visit (HOSPITAL_BASED_OUTPATIENT_CLINIC_OR_DEPARTMENT_OTHER): Payer: Self-pay

## 2021-04-25 ENCOUNTER — Ambulatory Visit (HOSPITAL_COMMUNITY)
Admission: RE | Admit: 2021-04-25 | Payer: 59 | Source: Ambulatory Visit | Attending: Cardiovascular Disease | Admitting: Cardiovascular Disease

## 2021-04-25 ENCOUNTER — Other Ambulatory Visit (HOSPITAL_COMMUNITY): Payer: Self-pay

## 2021-04-25 ENCOUNTER — Other Ambulatory Visit (HOSPITAL_BASED_OUTPATIENT_CLINIC_OR_DEPARTMENT_OTHER): Payer: Self-pay

## 2021-04-25 MED ORDER — LISINOPRIL 40 MG PO TABS
ORAL_TABLET | ORAL | 3 refills | Status: AC
  Filled 2021-04-25: qty 90, 90d supply, fill #0

## 2021-05-03 ENCOUNTER — Other Ambulatory Visit (HOSPITAL_BASED_OUTPATIENT_CLINIC_OR_DEPARTMENT_OTHER): Payer: Self-pay

## 2021-05-18 ENCOUNTER — Telehealth: Payer: Self-pay

## 2021-05-18 NOTE — Telephone Encounter (Signed)
? ?  Pre-operative Risk Assessment  ?  ?Patient Name: Spencer [name]  ?DOB: [birth year] ?MRN: [id number]  ? ? ? ?Request for Surgical Clearance   ? ?Procedure:  Dental Extraction - Amount of Teeth to be Pulled:  1  EXTRACTION OF UPPER RIGHT 3rd MOLAR ? ?Date of Surgery:  Clearance TBD                              ?   ?Surgeon:   ?Surgeon's Group or Practice Name:  [facility] ?Phone number:  [phone] ?Fax number:  [phone] ?  ?Type of Clearance Requested:   ?- Medical  ?- Pharmacy:  Hold Prasugrel (Effient) 2 DAYS ?  ?Type of Anesthesia:   IV WITH LOCAL W/EPI ?  ?Additional requests/questions:   ? ?

## 2021-05-18 NOTE — Telephone Encounter (Signed)
? ?  Primary Cardiologist: Kathlyn Sacramento, MD ? ?Chart reviewed as part of pre-operative protocol coverage. Simple dental extractions are considered low risk procedures per guidelines and generally do not require any specific cardiac clearance. It is also generally accepted that for simple extractions and dental cleanings, there is no need to interrupt blood thinner therapy.  ? ?SBE prophylaxis is not required for the patient. ? ?I will route this recommendation to the requesting party via Epic fax function and remove from pre-op pool. ? ?Please call with questions. ? ?Deberah Pelton, NP ?05/18/2021, 3:16 PM ? ? ? ? ?

## 2021-05-22 ENCOUNTER — Ambulatory Visit (HOSPITAL_COMMUNITY)
Admission: RE | Admit: 2021-05-22 | Discharge: 2021-05-22 | Disposition: A | Discharge status: Home / Self Care | Payer: 59 | Source: Ambulatory Visit | Attending: Cardiovascular Disease | Admitting: Cardiovascular Disease

## 2021-05-22 DIAGNOSIS — I739 Peripheral vascular disease, unspecified: Secondary | ICD-10-CM | POA: Diagnosis not present

## 2021-05-23 ENCOUNTER — Other Ambulatory Visit (HOSPITAL_BASED_OUTPATIENT_CLINIC_OR_DEPARTMENT_OTHER): Payer: Self-pay

## 2021-05-28 ENCOUNTER — Other Ambulatory Visit (HOSPITAL_BASED_OUTPATIENT_CLINIC_OR_DEPARTMENT_OTHER): Payer: Self-pay

## 2021-05-28 MED ORDER — SERTRALINE HCL 50 MG PO TABS
50.0000 mg | ORAL_TABLET | Freq: Two times a day (BID) | ORAL | 0 refills | Status: AC
  Filled 2021-05-28: qty 180, 90d supply, fill #0

## 2021-05-29 ENCOUNTER — Other Ambulatory Visit (HOSPITAL_BASED_OUTPATIENT_CLINIC_OR_DEPARTMENT_OTHER): Payer: Self-pay

## 2021-05-30 ENCOUNTER — Other Ambulatory Visit (HOSPITAL_BASED_OUTPATIENT_CLINIC_OR_DEPARTMENT_OTHER): Payer: Self-pay

## 2021-05-30 MED ORDER — TRAMADOL HCL 50 MG PO TABS
50.0000 mg | ORAL_TABLET | Freq: Four times a day (QID) | ORAL | 3 refills | Status: AC | PRN
  Filled 2021-05-30: qty 720, 90d supply, fill #0

## 2021-05-30 MED ORDER — TRAMADOL HCL 50 MG PO TABS: ORAL_TABLET | ORAL | 3 refills | Status: AC

## 2021-05-30 MED ORDER — ZOLPIDEM TARTRATE 10 MG PO TABS: ORAL_TABLET | ORAL | 1 refills | Status: AC

## 2021-05-30 MED ORDER — ZOLPIDEM TARTRATE 10 MG PO TABS
10.0000 mg | ORAL_TABLET | Freq: Every evening | ORAL | 0 refills | Status: AC | PRN
  Filled 2021-05-30 (×2): qty 90, 90d supply, fill #0

## 2021-05-31 ENCOUNTER — Other Ambulatory Visit (HOSPITAL_BASED_OUTPATIENT_CLINIC_OR_DEPARTMENT_OTHER): Payer: Self-pay

## 2021-05-31 MED ORDER — ZOSTER VAC RECOMB ADJUVANTED 50 MCG/0.5ML IM SUSR
INTRAMUSCULAR | 0 refills | Status: AC
  Filled 2021-05-31: qty 1, 1d supply, fill #0

## 2021-06-04 ENCOUNTER — Other Ambulatory Visit (HOSPITAL_BASED_OUTPATIENT_CLINIC_OR_DEPARTMENT_OTHER): Payer: Self-pay

## 2021-06-06 ENCOUNTER — Other Ambulatory Visit (HOSPITAL_BASED_OUTPATIENT_CLINIC_OR_DEPARTMENT_OTHER): Payer: Self-pay

## 2021-06-07 ENCOUNTER — Ambulatory Visit (INDEPENDENT_AMBULATORY_CARE_PROVIDER_SITE_OTHER): Payer: 59 | Admitting: Podiatry

## 2021-06-07 DIAGNOSIS — M79675 Pain in left toe(s): Secondary | ICD-10-CM | POA: Diagnosis not present

## 2021-06-07 DIAGNOSIS — Z7901 Long term (current) use of anticoagulants: Secondary | ICD-10-CM | POA: Diagnosis not present

## 2021-06-07 DIAGNOSIS — M79674 Pain in right toe(s): Secondary | ICD-10-CM

## 2021-06-07 DIAGNOSIS — B351 Tinea unguium: Secondary | ICD-10-CM | POA: Diagnosis not present

## 2021-06-07 DIAGNOSIS — L600 Ingrowing nail: Secondary | ICD-10-CM

## 2021-06-07 DIAGNOSIS — I739 Peripheral vascular disease, unspecified: Secondary | ICD-10-CM

## 2021-06-07 DIAGNOSIS — I6523 Occlusion and stenosis of bilateral carotid arteries: Secondary | ICD-10-CM

## 2021-06-10 NOTE — Progress Notes (Signed)
Subjective:  ? ?Patient ID: Spencer [name], male   DOB: 66 y.o.   MRN: [id number]  ? ?HPI ?66 year old male presents the office today for concerns of ingrown toenails.  His wife does trim the nail himself.  Since I last saw him he has followed up with Dr. Fletcher Anon for circulation.  He presents today in hopes of having the ingrown toenails removed permanently.  They do cause ongoing discomfort.  He has not seen any swelling or redness.  His wife is able to trim the nails which resolves the pain. ? ? ?Review of Systems  ?All other systems reviewed and are negative. ? ?Past Medical History:  ?Diagnosis Date  ? Anxiety attack   ? Arthritis   ? "back; shoulders; knees" (07/23/2013)  ? Back pain, chronic   ? "all over" (07/23/2013)  ? Benign neoplasm of colon 04/01/2000  ? Hyperplastic  ? Coronary artery disease   ? a. NSTEMI 10/12: s/p CABG with L-LAD, S-OM1/OM2, S-RV marg/RCA;  b. 07/2013 Cath/PCI: LM <10, LAD 70-80p, 60m D1 90, RI 30ost, LCX 40-50p, 935mOM1 90, OM2 100, RCA 100p, VG->OM1->OM2 patent with 90 @ anast (2.5x12 Xience Alpine DES), RCA 100p, LIMA->LAD nl, VG->Diag nl, VG->RV marginal/RCA 90p (3.0x23 Xience Alpine DES);  c. 07/2013 Echo: EF 55-60%.  ? Depression   ? "pain related"  ? DM2 (diabetes mellitus, type 2) ([facility]  ? GERD (gastroesophageal reflux disease)   ? Hyperlipidemia   ? Hypertension   ? IBS (irritable bowel syndrome)   ? Morbidly obese ([facility]  ? ? ?Past Surgical History:  ?Procedure Laterality Date  ? ABDOMINAL AORTOGRAM W/LOWER EXTREMITY N/A 07/30/2017  ? Procedure: ABDOMINAL AORTOGRAM W/LOWER EXTREMITY;  Surgeon: ArWellington HampshireMD;  [facility];  Service: Cardiovascular;  Laterality: N/A;  ? BACK SURGERY    ? CARDIAC CATHETERIZATION  11/2010  ? CARPAL TUNNEL RELEASE Bilateral   ? CORONARY ANGIOPLASTY WITH STENT PLACEMENT  07/23/2013  ? "2"  ? CORONARY ARTERY BYPASS GRAFT  11/26/10  ? x 6 SURGEON DR. PETER VAN TRIGT  ? HERNIA REPAIR    ? UHR  ? LEFT HEART CATH AND CORS/GRAFTS  ANGIOGRAPHY N/A 01/29/2021  ? Procedure: LEFT HEART CATH AND CORS/GRAFTS ANGIOGRAPHY;  Surgeon: McBurnell BlanksMD;  [facility];  Service: Cardiovascular;  Laterality: N/A;  ? LEFT HEART CATHETERIZATION WITH CORONARY/GRAFT ANGIOGRAM N/A 07/23/2013  ? Procedure: LEFT HEART CATHETERIZATION WITH COBeatrix Fetters Surgeon: Peter M JoMartiniqueMD;  [facility];  Service: Cardiovascular;  Laterality: N/A;  ? LUMBAR FUSION    ? MULTIPLE; DR. ELEllene Route? SHOULDER ARTHROSCOPY W/ ROTATOR CUFF REPAIR Bilateral   ? UMBILICAL HERNIA REPAIR    ? ? ? ?Current Outpatient Medications:  ?  Ascorbic Acid (VITAMIN C) 1000 MG tablet, Take 1,000 mg by mouth daily., Disp: , Rfl:  ?  aspirin EC 81 MG tablet, Take 81 mg by mouth daily., Disp: , Rfl:  ?  carvedilol (COREG) 6.25 MG tablet, TAKE 1 TABLET BY MOUTH TWICE DAILY, Disp: 180 tablet, Rfl: 2 ?  cholecalciferol (VITAMIN D) 1000 UNITS tablet, Take 1,000 Units by mouth daily., Disp: , Rfl:  ?  COVID-19 mRNA bivalent vaccine, Moderna, (MODERNA COVID-19 BIVAL BOOSTER) 50 MCG/0.5ML injection, Inject into the muscle., Disp: 0.5 mL, Rfl: 0 ?  docusate sodium (COLACE) 100 MG capsule, Take 100 mg by mouth 2 (two) times daily., Disp: , Rfl:  ?  empagliflozin (JARDIANCE) 25 MG TABS  tablet, Take 1 tablet by mouth once a day (Patient taking differently: Take 25 mg by mouth daily.), Disp: 90 tablet, Rfl: 3 ?  Evolocumab (REPATHA SURECLICK) 235 MG/ML SOAJ, Inject 140 mg into the skin every 14 (fourteen) days., Disp: 4 mL, Rfl: 11 ?  famotidine (PEPCID) 20 MG tablet, TAKE 2 TABLETS BY MOUTH TWO TIMES DAILY (Patient taking differently: Take 40 mg by mouth 2 (two) times daily.), Disp: 360 tablet, Rfl: 3 ?  FREESTYLE LITE test strip, , Disp: , Rfl:  ?  HUMALOG KWIKPEN 100 UNIT/ML KwikPen, Inject 4 Units into the skin 3 (three) times daily as needed (high blood sugar level). With meals as directed, Disp: , Rfl:  ?  icosapent Ethyl (VASCEPA) 1 g capsule, Take 2  capsules by mouth with meals twice a day (Patient taking differently: Take 2 g by mouth in the morning and at bedtime.), Disp: 360 capsule, Rfl: 3 ?  insulin glargine, 2 Unit Dial, (TOUJEO MAX SOLOSTAR) 300 UNIT/ML Solostar Pen, Inject 32 units into the skin daily and increase as directed in clinic up to 100 units. (Patient taking differently: Inject 32 Units into the skin daily.), Disp: 12 mL, Rfl: 11 ?  insulin lispro (HUMALOG KWIKPEN) 100 UNIT/ML KwikPen, Inject 4 units under the skin daily with meals., Disp: 12 mL, Rfl: 3 ?  Insulin Pen Needle 32G X 6 MM MISC, Use with Victoza daily., Disp: 100 each, Rfl: 3 ?  isosorbide mononitrate (IMDUR) 30 MG 24 hr tablet, Take 1 tablet (30 mg total) by mouth daily., Disp: 90 tablet, Rfl: 1 ?  lisinopril (PRINIVIL,ZESTRIL) 40 MG tablet, Take 1 tablet (40 mg total) by mouth daily., Disp: 30 tablet, Rfl: 6 ?  lisinopril (ZESTRIL) 40 MG tablet, Take 1 tablet by mouth once daily, Disp: 90 tablet, Rfl: 3 ?  metFORMIN (GLUCOPHAGE-XR) 500 MG 24 hr tablet, Take 2 tablets by mouth twice a day as directed, Disp: 360 tablet, Rfl: 0 ?  methocarbamol (ROBAXIN) 500 MG tablet, TAKE 1 TABLET BY MOUTH 3 TIMES DAILY AS NEEDED (Patient taking differently: Take 500 mg by mouth 3 (three) times daily.), Disp: 270 tablet, Rfl: 3 ?  nitroGLYCERIN (NITROSTAT) 0.4 MG SL tablet, Place 1 tablet (0.4 mg total) under the tongue every 5 (five) minutes as needed for chest pain or shortness of breath, Disp: 25 tablet, Rfl: 3 ?  PENTIPS 31G X 5 MM MISC, , Disp: , Rfl:  ?  polyethylene glycol powder (GLYCOLAX/MIRALAX) 17 GM/SCOOP powder, Take 17 g by mouth daily as needed (constipation)., Disp: , Rfl:  ?  prasugrel (EFFIENT) 10 MG TABS tablet, Take 1 tablet (10 mg total) by mouth daily., Disp: 90 tablet, Rfl: 3 ?  pregabalin (LYRICA) 150 MG capsule, Take 1 capsule (150 mg total) by mouth 2 (two) times daily., Disp: 180 capsule, Rfl: 3 ?  Probiotic Product (PROBIOTIC PO), Take 1 capsule by mouth daily. Keifer  probiotic, Disp: , Rfl:  ?  pyridOXINE (VITAMIN B-6) 100 MG tablet, Take 100 mg by mouth daily., Disp: , Rfl:  ?  sertraline (ZOLOFT) 50 MG tablet, TAKE 1 TABLET BY MOUTH TWICE DAILY, Disp: 180 tablet, Rfl: 0 ?  traMADol (ULTRAM) 50 MG tablet, Take 1-2 tablets (50-100 mg total) by mouth every 6 (six) hours as needed for pain., Disp: 720 tablet, Rfl: 3 ?  traMADol (ULTRAM) 50 MG tablet, TAKE 1 TO 2 TABLETS BY MOUTH EVERY 6 HOURS AS NEEDED, Disp: 720 tablet, Rfl: 3 ?  zolpidem (AMBIEN) 10 MG tablet, Take  1 tablet by mouth at bedtime as needed for insomnia (Patient taking differently: Take 10 mg by mouth at bedtime.), Disp: 30 tablet, Rfl: 0 ?  zolpidem (AMBIEN) 10 MG tablet, Take 1 tablet (10 mg total) by mouth at bedtime as needed for insomnia., Disp: 90 tablet, Rfl: 0 ?  zolpidem (AMBIEN) 10 MG tablet, TAKE 1 TABLET BY MOUTH AT BEDTIME AS NEEDED FOR INSOMNIA, Disp: 90 tablet, Rfl: 1 ?  Zoster Vaccine Adjuvanted [facility]) injection, Inject into the muscle., Disp: 1 each, Rfl: 0 ? ?Allergies  ?Allergen Reactions  ? Victoza [Liraglutide] Other (See Comments)  ?  pancreatitis  ? Brilinta [Ticagrelor] Other (See Comments)  ?  Dyspnea  ? Codeine Other (See Comments)  ?  hyperactive  ? Other Nausea And Vomiting  ?  Mayonnaise causes nausea and vomiting  ? Statins Other (See Comments)  ?  Muscle weakness & fatigue  ? ? ? ? ? ?   ?Objective:  ?Physical Exam  ?General: AAO x3, NAD ? ?Dermatological: Chronic incurvation present to the hallux toenails without any edema, erythema, drainage or pus or any signs of infection.  Angiulli nails are hypertrophic, dystrophic and have yellow discoloration and elongated x10.  He gets tenderness nails 1-5 bilaterally.  There is no open lesions today. ? ?Vascular: Dorsalis Pedis artery and Posterior Tibial artery pedal pulses are 1/4 bilateral with immedate capillary fill time. There is no pain with calf compression, swelling, warmth, erythema.  ? ?Neruologic: Grossly intact via light  touch bilateral.  ? ?Musculoskeletal: Aside from the toenails no other areas of discomfort.  Muscular strength 5/5 in all groups tested bilateral. ? ?Gait: Unassisted, Nonantalgic.  ? ? ?   ?Assessment:  ? ?Ingrown toe

## 2021-06-11 ENCOUNTER — Other Ambulatory Visit (HOSPITAL_BASED_OUTPATIENT_CLINIC_OR_DEPARTMENT_OTHER): Payer: Self-pay

## 2021-06-14 DIAGNOSIS — R82998 Other abnormal findings in urine: Secondary | ICD-10-CM | POA: Diagnosis not present

## 2021-06-14 DIAGNOSIS — E785 Hyperlipidemia, unspecified: Secondary | ICD-10-CM | POA: Diagnosis not present

## 2021-06-14 DIAGNOSIS — Z125 Encounter for screening for malignant neoplasm of prostate: Secondary | ICD-10-CM | POA: Diagnosis not present

## 2021-06-14 DIAGNOSIS — I10 Essential (primary) hypertension: Secondary | ICD-10-CM | POA: Diagnosis not present

## 2021-06-14 DIAGNOSIS — E1140 Type 2 diabetes mellitus with diabetic neuropathy, unspecified: Secondary | ICD-10-CM | POA: Diagnosis not present

## 2021-06-20 DIAGNOSIS — I10 Essential (primary) hypertension: Secondary | ICD-10-CM | POA: Diagnosis not present

## 2021-06-20 DIAGNOSIS — Z1212 Encounter for screening for malignant neoplasm of rectum: Secondary | ICD-10-CM | POA: Diagnosis not present

## 2021-06-20 DIAGNOSIS — Z0000 Encounter for general adult medical examination without abnormal findings: Secondary | ICD-10-CM | POA: Diagnosis not present

## 2021-06-20 DIAGNOSIS — E1151 Type 2 diabetes mellitus with diabetic peripheral angiopathy without gangrene: Secondary | ICD-10-CM | POA: Diagnosis not present

## 2021-06-20 DIAGNOSIS — E785 Hyperlipidemia, unspecified: Secondary | ICD-10-CM | POA: Diagnosis not present

## 2021-06-20 DIAGNOSIS — Z1331 Encounter for screening for depression: Secondary | ICD-10-CM | POA: Diagnosis not present

## 2021-06-20 DIAGNOSIS — E669 Obesity, unspecified: Secondary | ICD-10-CM | POA: Diagnosis not present

## 2021-06-20 DIAGNOSIS — G8929 Other chronic pain: Secondary | ICD-10-CM | POA: Diagnosis not present

## 2021-06-20 DIAGNOSIS — I2510 Atherosclerotic heart disease of native coronary artery without angina pectoris: Secondary | ICD-10-CM | POA: Diagnosis not present

## 2021-07-19 DEATH — deceased

## 2021-08-13 ENCOUNTER — Other Ambulatory Visit (HOSPITAL_BASED_OUTPATIENT_CLINIC_OR_DEPARTMENT_OTHER): Payer: Self-pay

## 2021-09-06 ENCOUNTER — Ambulatory Visit: Payer: 59 | Admitting: Podiatry

## 2021-10-16 ENCOUNTER — Ambulatory Visit: Payer: 59 | Admitting: Cardiovascular Disease

## 2022-01-22 IMAGING — DX DG CHEST 2V
2 series · 3 of 3 positions shown · non-contrast
Comparison: Chest x-ray 12/23/2017

CLINICAL DATA: Chest pain

EXAM:
CHEST - 2 VIEW

[Series 1: chest pa · 0.14mm/px · 2 of 2 slices shown]
[im 1/2]
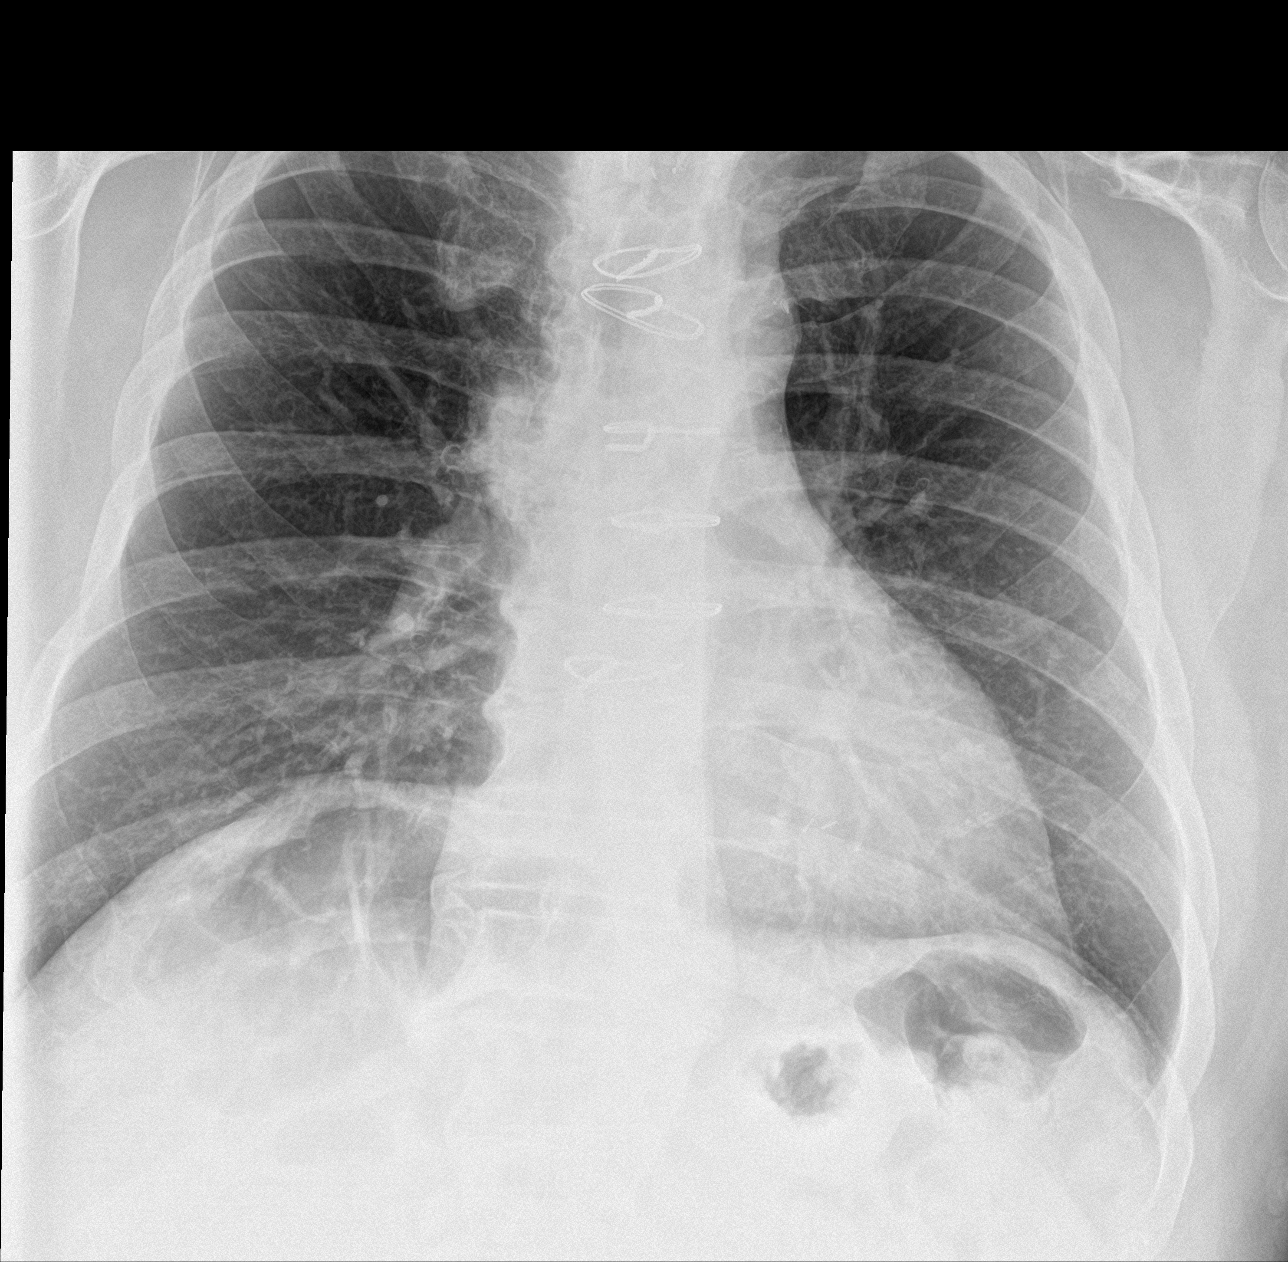
[im 2/2]
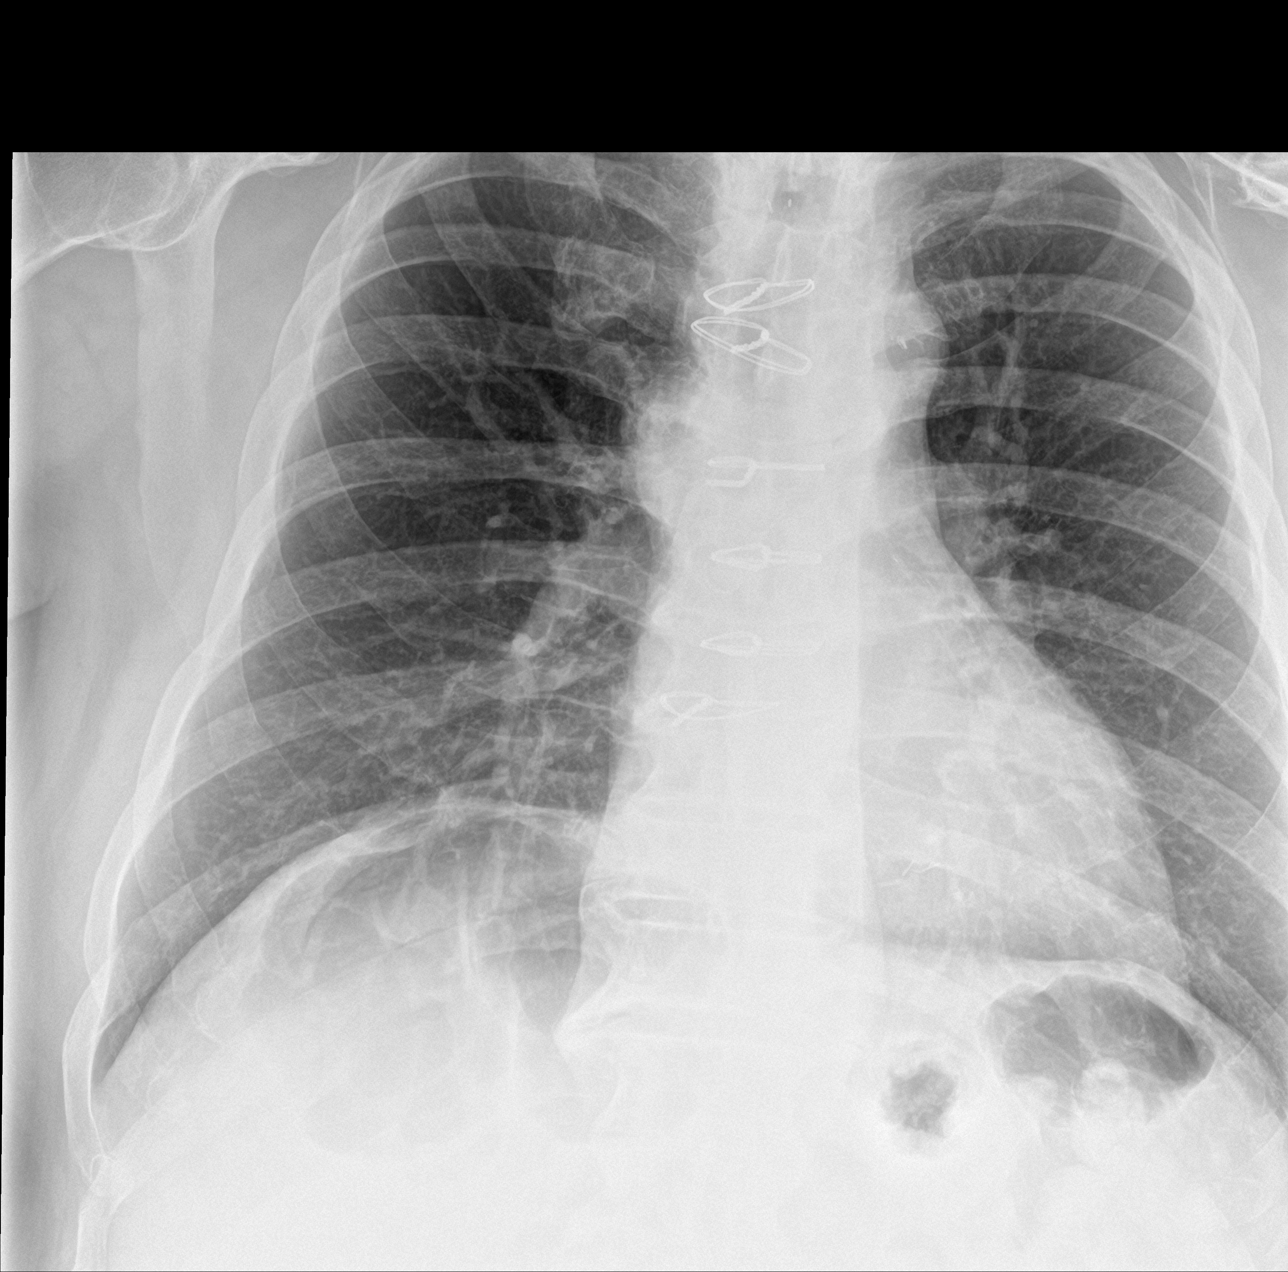

[chest lat]
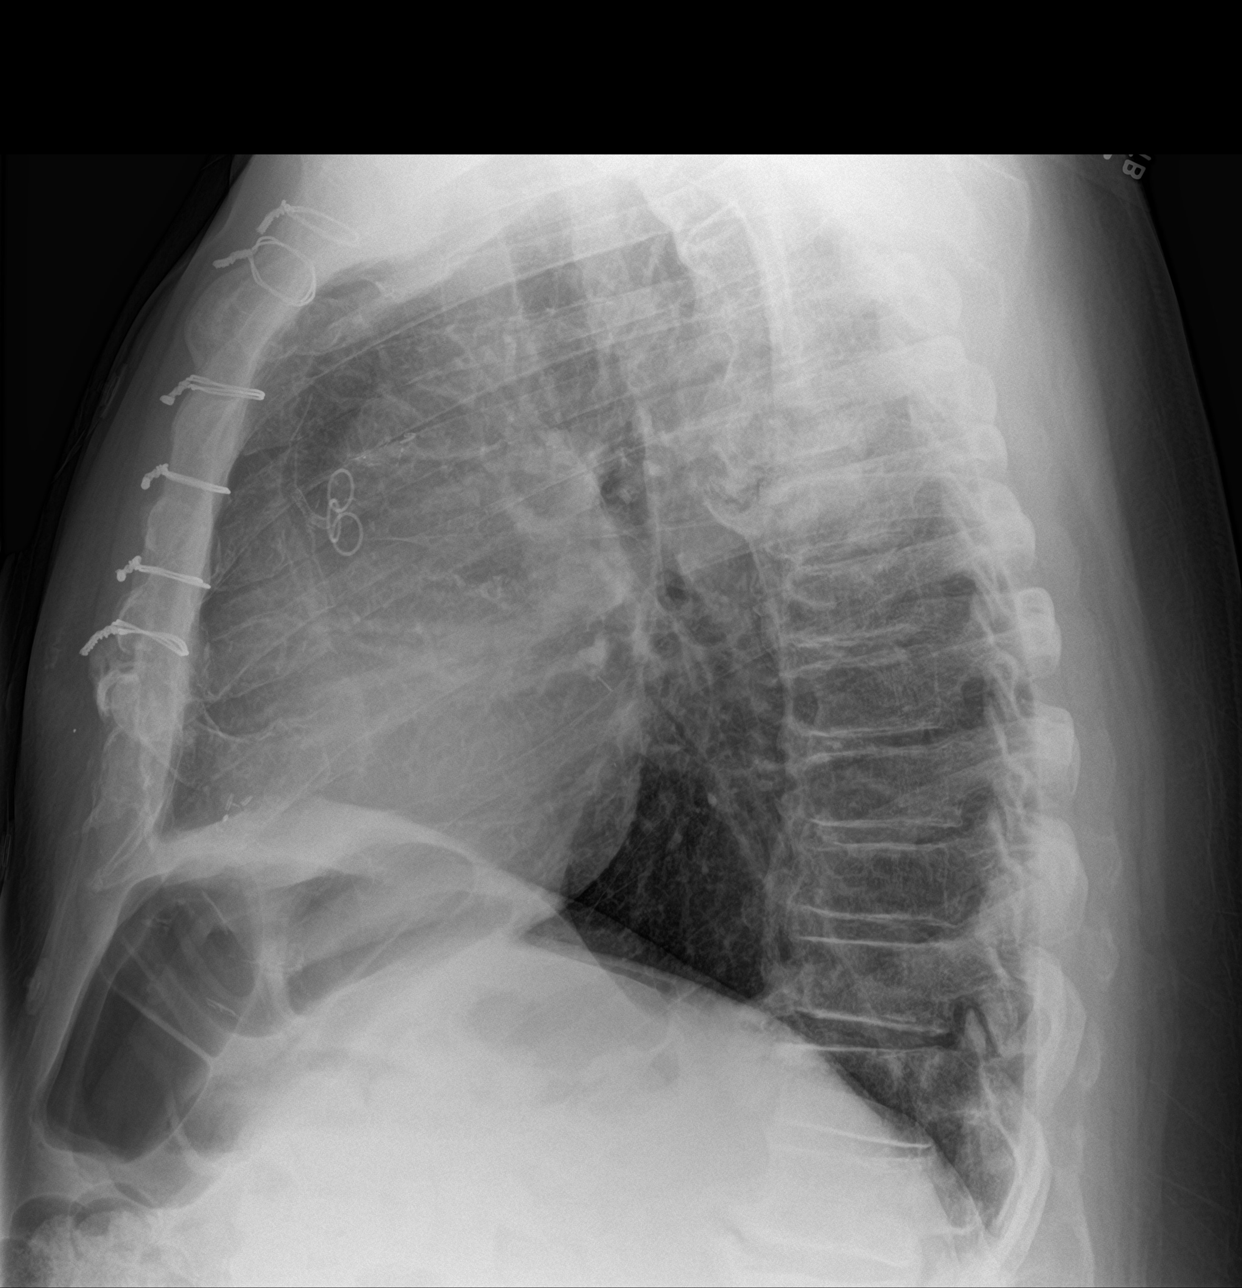

[3 of 3 positions shown; findings below may reference images not displayed]

FINDINGS: Heart size and mediastinal contours are within normal limits.
Cardiac surgical changes and median sternotomy wires. No suspicious
pulmonary opacities identified.

No pleural effusion or pneumothorax visualized.

No acute osseous abnormality appreciated.
IMPRESSION: No acute intrathoracic process identified.

## 2022-11-25 ENCOUNTER — Other Ambulatory Visit (HOSPITAL_BASED_OUTPATIENT_CLINIC_OR_DEPARTMENT_OTHER): Payer: Self-pay

## 2023-10-06 ENCOUNTER — Other Ambulatory Visit (HOSPITAL_BASED_OUTPATIENT_CLINIC_OR_DEPARTMENT_OTHER): Payer: Self-pay

## 2024-01-01 ENCOUNTER — Other Ambulatory Visit (HOSPITAL_BASED_OUTPATIENT_CLINIC_OR_DEPARTMENT_OTHER): Payer: Self-pay
